# Patient Record
Sex: Female | Born: 1965
Health system: Southern US, Community
[De-identification: ages and names within clinical notes are randomized; demographics above are authoritative.]

## PROBLEM LIST (undated history)

## (undated) DIAGNOSIS — I701 Atherosclerosis of renal artery: Secondary | ICD-10-CM

## (undated) DIAGNOSIS — I150 Renovascular hypertension: Secondary | ICD-10-CM

## (undated) DIAGNOSIS — I10 Essential (primary) hypertension: Secondary | ICD-10-CM

## (undated) DIAGNOSIS — E785 Hyperlipidemia, unspecified: Secondary | ICD-10-CM

## (undated) DIAGNOSIS — K5900 Constipation, unspecified: Secondary | ICD-10-CM

## (undated) DIAGNOSIS — D649 Anemia, unspecified: Secondary | ICD-10-CM

## (undated) DIAGNOSIS — I700 Atherosclerosis of aorta: Secondary | ICD-10-CM

## (undated) DIAGNOSIS — T7840XA Allergy, unspecified, initial encounter: Secondary | ICD-10-CM

## (undated) DIAGNOSIS — E119 Type 2 diabetes mellitus without complications: Secondary | ICD-10-CM

## (undated) DIAGNOSIS — K219 Gastro-esophageal reflux disease without esophagitis: Secondary | ICD-10-CM

## (undated) HISTORY — DX: Gastro-esophageal reflux disease without esophagitis: K21.9

## (undated) HISTORY — DX: Allergy, unspecified, initial encounter: T78.40XA

## (undated) HISTORY — DX: Anemia, unspecified: D64.9

## (undated) HISTORY — DX: Hyperlipidemia, unspecified: E78.5

## (undated) HISTORY — DX: Constipation, unspecified: K59.00

## (undated) HISTORY — DX: Renovascular hypertension: I15.0

## (undated) HISTORY — PX: TUBAL LIGATION: SHX77

## (undated) HISTORY — PX: OTHER SURGICAL HISTORY: SHX169

## (undated) HISTORY — DX: Atherosclerosis of aorta: I70.0

## (undated) HISTORY — DX: Atherosclerosis of renal artery: I70.1

## (undated) HISTORY — PX: ABLATION: SHX5711

## (undated) HISTORY — PX: WISDOM TOOTH EXTRACTION: SHX21

---

## 2016-04-24 DIAGNOSIS — Z112 Encounter for screening for other bacterial diseases: Secondary | ICD-10-CM | POA: Diagnosis not present

## 2016-04-24 DIAGNOSIS — R509 Fever, unspecified: Secondary | ICD-10-CM | POA: Diagnosis not present

## 2016-04-24 DIAGNOSIS — J3489 Other specified disorders of nose and nasal sinuses: Secondary | ICD-10-CM | POA: Diagnosis not present

## 2016-04-24 DIAGNOSIS — H6692 Otitis media, unspecified, left ear: Secondary | ICD-10-CM | POA: Diagnosis not present

## 2016-05-01 DIAGNOSIS — Z01419 Encounter for gynecological examination (general) (routine) without abnormal findings: Secondary | ICD-10-CM | POA: Diagnosis not present

## 2016-05-01 DIAGNOSIS — K219 Gastro-esophageal reflux disease without esophagitis: Secondary | ICD-10-CM | POA: Diagnosis not present

## 2016-05-01 DIAGNOSIS — R109 Unspecified abdominal pain: Secondary | ICD-10-CM | POA: Diagnosis not present

## 2016-05-05 DIAGNOSIS — Z6841 Body Mass Index (BMI) 40.0 and over, adult: Secondary | ICD-10-CM | POA: Diagnosis not present

## 2016-05-05 DIAGNOSIS — Z0000 Encounter for general adult medical examination without abnormal findings: Secondary | ICD-10-CM | POA: Diagnosis not present

## 2016-05-05 DIAGNOSIS — Z1389 Encounter for screening for other disorder: Secondary | ICD-10-CM | POA: Diagnosis not present

## 2016-05-08 DIAGNOSIS — Z803 Family history of malignant neoplasm of breast: Secondary | ICD-10-CM | POA: Diagnosis not present

## 2016-05-08 DIAGNOSIS — Z1231 Encounter for screening mammogram for malignant neoplasm of breast: Secondary | ICD-10-CM | POA: Diagnosis not present

## 2016-05-21 ENCOUNTER — Encounter: Payer: Self-pay | Admitting: Gastroenterology

## 2016-05-27 ENCOUNTER — Encounter (HOSPITAL_COMMUNITY): Payer: Self-pay | Admitting: Emergency Medicine

## 2016-05-27 ENCOUNTER — Emergency Department (HOSPITAL_COMMUNITY): Payer: BLUE CROSS/BLUE SHIELD

## 2016-05-27 ENCOUNTER — Emergency Department (HOSPITAL_COMMUNITY)
Admission: EM | Admit: 2016-05-27 | Discharge: 2016-05-27 | Disposition: A | Discharge status: Home / Self Care | Payer: BLUE CROSS/BLUE SHIELD | Attending: Emergency Medicine | Admitting: Emergency Medicine

## 2016-05-27 DIAGNOSIS — R109 Unspecified abdominal pain: Secondary | ICD-10-CM

## 2016-05-27 DIAGNOSIS — Z87891 Personal history of nicotine dependence: Secondary | ICD-10-CM | POA: Diagnosis not present

## 2016-05-27 DIAGNOSIS — R1011 Right upper quadrant pain: Secondary | ICD-10-CM | POA: Diagnosis not present

## 2016-05-27 DIAGNOSIS — K760 Fatty (change of) liver, not elsewhere classified: Secondary | ICD-10-CM | POA: Diagnosis not present

## 2016-05-27 DIAGNOSIS — Z79899 Other long term (current) drug therapy: Secondary | ICD-10-CM | POA: Insufficient documentation

## 2016-05-27 HISTORY — DX: Essential (primary) hypertension: I10

## 2016-05-27 LAB — CBC
HCT: 37.4 % (ref 36.0–46.0)
Hemoglobin: 12.6 g/dL (ref 12.0–15.0)
MCH: 28.8 pg (ref 26.0–34.0)
MCHC: 33.7 g/dL (ref 30.0–36.0)
MCV: 85.6 fL (ref 78.0–100.0)
Platelets: 294 10*3/uL (ref 150–400)
RBC: 4.37 MIL/uL (ref 3.87–5.11)
RDW: 13.4 % (ref 11.5–15.5)
WBC: 9.8 10*3/uL (ref 4.0–10.5)

## 2016-05-27 LAB — COMPREHENSIVE METABOLIC PANEL
ALT: 23 U/L (ref 14–54)
AST: 16 U/L (ref 15–41)
Albumin: 3.8 g/dL (ref 3.5–5.0)
Alkaline Phosphatase: 59 U/L (ref 38–126)
Anion gap: 8 (ref 5–15)
BUN: 22 mg/dL — ABNORMAL HIGH (ref 6–20)
CO2: 26 mmol/L (ref 22–32)
Calcium: 8.8 mg/dL — ABNORMAL LOW (ref 8.9–10.3)
Chloride: 106 mmol/L (ref 101–111)
Creatinine, Ser: 0.98 mg/dL (ref 0.44–1.00)
GFR calc Af Amer: >60 mL/min (ref >60)
GFR calc non Af Amer: >60 mL/min (ref >60)
Glucose, Bld: 116 mg/dL — ABNORMAL HIGH (ref 65–99)
Potassium: 3.6 mmol/L (ref 3.5–5.1)
Sodium: 140 mmol/L (ref 135–145)
Total Bilirubin: 0.7 mg/dL (ref 0.3–1.2)
Total Protein: 7.6 g/dL (ref 6.5–8.1)

## 2016-05-27 LAB — URINALYSIS, ROUTINE W REFLEX MICROSCOPIC
Bilirubin Urine: NEGATIVE
Glucose, UA: NEGATIVE mg/dL
Hgb urine dipstick: NEGATIVE
Ketones, ur: NEGATIVE mg/dL
Leukocytes, UA: NEGATIVE
Nitrite: NEGATIVE
Protein, ur: NEGATIVE mg/dL
Specific Gravity, Urine: 1.01 (ref 1.005–1.030)
pH: 6.5 (ref 5.0–8.0)

## 2016-05-27 LAB — LIPASE, BLOOD: Lipase: 35 U/L (ref 11–51)

## 2016-05-27 LAB — I-STAT BETA HCG BLOOD, ED (MC, WL, AP ONLY): I-stat hCG, quantitative: <5 m[IU]/mL (ref <5)

## 2016-05-27 MED ORDER — ONDANSETRON 4 MG PO TBDP
4.0000 mg | ORAL_TABLET | Freq: Once | ORAL | Status: AC | PRN
  Administered 2016-05-27: 4 mg via ORAL
  Filled 2016-05-27: qty 1

## 2016-05-27 MED ORDER — ONDANSETRON HCL 4 MG/2ML IJ SOLN
4.0000 mg | Freq: Four times a day (QID) | INTRAMUSCULAR | Status: DC | PRN
  Administered 2016-05-27: 4 mg via INTRAVENOUS
  Filled 2016-05-27: qty 2

## 2016-05-27 MED ORDER — MORPHINE SULFATE (PF) 4 MG/ML IV SOLN
4.0000 mg | INTRAVENOUS | Status: DC | PRN
  Administered 2016-05-27: 4 mg via INTRAVENOUS
  Filled 2016-05-27: qty 1

## 2016-05-27 NOTE — ED Provider Notes (Signed)
CSN: [id number]     Arrival date & time 05/27/16  1554 History   First MD Initiated Contact with Patient 05/27/16 1800     Chief Complaint  Patient presents with  . Abdominal Pain     (Consider location/radiation/quality/duration/timing/severity/associated sxs/prior Treatment) HPI Comments: Pt comes in with cc of abd pain.  Pt has hx of HTN, no abd suegery hx, no hx of renal stones or pelvic disorder. She reports that she has been having some abd pain intermittently for the past week, but this afternoon it got severe. The pain is located in the RUQ and Right flank region. She is unsure if the pain is related to po intake. There is associated nausea and loose BM. No uti like symptoms.   ROS 10 Systems reviewed and are negative for acute change except as noted in the HPI.     Patient is a 50 y.o. female presenting with abdominal pain. The history is provided by the patient.  Abdominal Pain   Past Medical History  Diagnosis Date  . Hypertension    No past surgical history on file. No family history on file. Social History  Substance Use Topics  . Smoking status: Former Research scientist (life sciences)  . Smokeless tobacco: Not on file  . Alcohol Use: No   OB History    No data available     Review of Systems  Gastrointestinal: Positive for abdominal pain.      Allergies  Review of patient's allergies indicates no known allergies.  Home Medications   Prior to Admission medications   Medication Sig Start Date End Date Taking? Authorizing Provider  amLODipine (NORVASC) 10 MG tablet Take 10 mg by mouth daily.   Yes Historical Provider, MD  benazepril (LOTENSIN) 20 MG tablet Take 20 mg by mouth daily.   Yes Historical Provider, MD  cloNIDine (CATAPRES) 0.1 MG tablet Take 0.1 mg by mouth 3 (three) times daily as needed. High blood pressure 05/05/16  Yes Historical Provider, MD  Cyanocobalamin (VITAMIN B-12 PO) Take 1 tablet by mouth daily.   Yes Historical Provider, MD  furosemide (LASIX) 40 MG  tablet Take 40 mg by mouth daily as needed. swelling 05/05/16  Yes Historical Provider, MD  omeprazole (PRILOSEC) 20 MG capsule Take 20 mg by mouth daily.   Yes Historical Provider, MD  OVER THE COUNTER MEDICATION Take 1 tablet by mouth daily. OTC- Mega red supplement tablet   Yes Historical Provider, MD  triamterene-hydrochlorothiazide (DYAZIDE) 37.5-25 MG capsule Take 1 capsule by mouth daily.   Yes Historical Provider, MD   BP 104/54 mmHg  Pulse 65  Temp(Src) 98 F (36.7 C) (Oral)  Resp 18  SpO2 96% Physical Exam  Constitutional: She is oriented to person, place, and time. She appears well-developed.  HENT:  Head: Normocephalic and atraumatic.  Eyes: Conjunctivae and EOM are normal. Pupils are equal, round, and reactive to light.  Neck: Normal range of motion. Neck supple.  Cardiovascular: Normal rate, regular rhythm and normal heart sounds.   Pulmonary/Chest: Effort normal and breath sounds normal. No respiratory distress.  Abdominal: Soft. Bowel sounds are normal. She exhibits no distension. There is tenderness. There is no rebound and no guarding.  RUQ tenderness  Neurological: She is alert and oriented to person, place, and time.  Skin: Skin is warm and dry.  Nursing note and vitals reviewed.   ED Course  Procedures (including critical care time) Labs Review Labs Reviewed  COMPREHENSIVE METABOLIC PANEL - Abnormal; Notable for the following:  Glucose, Bld 116 (*)    BUN 22 (*)    Calcium 8.8 (*)    All other components within normal limits  LIPASE, BLOOD  CBC  URINALYSIS, ROUTINE W REFLEX MICROSCOPIC (NOT AT [facility])  I-STAT BETA HCG BLOOD, ED ([facility], [facility], [facility] ONLY)    Imaging Review US Abdomen Limited  05/27/2016  CLINICAL DATA:  Right upper quadrant abdomen pain for 1 week EXAM: US ABDOMEN LIMITED - RIGHT UPPER QUADRANT COMPARISON:  None. FINDINGS: Gallbladder: No gallstones or wall thickening visualized. No sonographic Murphy sign noted by sonographer. Common bile duct:  Diameter: 2.3 mm Liver: No focal lesion identified. There is diffuse increased echotexture of the liver. IMPRESSION: Normal gallbladder. Fatty infiltration of liver. Electronically Signed   By: Abelardo Diesel M.D.   On: 05/27/2016 20:26   I have personally reviewed and evaluated these images and lab results as part of my medical decision-making.   EKG Interpretation None      MDM   Final diagnoses:  RUQ abdominal pain  Acute flank pain    DDx includes: Pancreatitis Hepatobiliary pathology including cholecystitis Gastritis/PUD SBO Pyelonephritis Renal stones  Pt comes in with 1 week of RUQ and R flank pain w/o uti like symptoms or specific association to food intake. She has abd tenderness in the RUQ w/o guarding currently, and the pain is improving. We will get [place] abd and basic labs.  9:20 PM Pt's labs are reassuring and so is the [place] ABD. PT reports pain is at 2/10. Will d/c.  Pt to see pcp for further evaluation, as other non emergent conditions possible. Strict ER return precautions have been discussed, and patient is agreeing with the plan and is comfortable with the workup done and the recommendations from the ER.   Varney Biles, MD 05/27/16 2120

## 2016-05-27 NOTE — ED Notes (Signed)
Pt c/o right abdominal pain, nausea, diarrhea onset 1 week ago, worse after eating. Hx of gallbladder issues. No emesis.

## 2016-05-27 NOTE — Discharge Instructions (Signed)
We saw you in the ER for THE ABDOMINAL PAIN AND BACK PAIN. All the results in the ER are normal, labs and imaging. We are not sure what is causing your symptoms. The workup in the ER is not complete, and is limited to screening for life threatening and emergent conditions only, so please see a primary care doctor for further evaluation.   Abdominal Pain, Adult Many things can cause abdominal pain. Usually, abdominal pain is not caused by a disease and will improve without treatment. It can often be observed and treated at home. Your health care provider will do a physical exam and possibly order blood tests and X-rays to help determine the seriousness of your pain. However, in many cases, more time must pass before a clear cause of the pain can be found. Before that point, your health care provider [place] not know if you need more testing or further treatment. HOME CARE INSTRUCTIONS Monitor your abdominal pain for any changes. The following actions Smoots help to alleviate any discomfort you are experiencing:  Only take over-the-counter or prescription medicines as directed by your health care provider.  Do not take laxatives unless directed to do so by your health care provider.  Try a clear liquid diet (broth, tea, or water) as directed by your health care provider. Slowly move to a bland diet as tolerated. SEEK MEDICAL CARE IF:  You have unexplained abdominal pain.  You have abdominal pain associated with nausea or diarrhea.  You have pain when you urinate or have a bowel movement.  You experience abdominal pain that wakes you in the night.  You have abdominal pain that is worsened or improved by eating food.  You have abdominal pain that is worsened with eating fatty foods.  You have a fever. SEEK IMMEDIATE MEDICAL CARE IF:  Your pain does not go away within 2 hours.  You keep throwing up (vomiting).  Your pain is felt only in portions of the abdomen, such as the right side or the  left lower portion of the abdomen.  You pass bloody or black tarry stools. MAKE SURE YOU:  Understand these instructions.  Will watch your condition.  Will get help right away if you are not doing well or get worse.   This information is not intended to replace advice given to you by your health care provider. Make sure you discuss any questions you have with your health care provider.   Document Released: 08/26/2005 Document Revised: 08/07/2015 Document Reviewed: 07/26/2013 Elsevier Interactive Patient Education [employer].

## 2016-05-29 ENCOUNTER — Emergency Department (HOSPITAL_COMMUNITY): Payer: BLUE CROSS/BLUE SHIELD

## 2016-05-29 ENCOUNTER — Observation Stay (HOSPITAL_COMMUNITY)
Admission: EM | Admit: 2016-05-29 | Discharge: 2016-05-30 | Disposition: A | Discharge status: Home / Self Care | Payer: BLUE CROSS/BLUE SHIELD | Attending: Allergy & Immunology | Admitting: Allergy & Immunology

## 2016-05-29 ENCOUNTER — Encounter (HOSPITAL_COMMUNITY): Payer: Self-pay

## 2016-05-29 DIAGNOSIS — D729 Disorder of white blood cells, unspecified: Secondary | ICD-10-CM | POA: Diagnosis not present

## 2016-05-29 DIAGNOSIS — I10 Essential (primary) hypertension: Secondary | ICD-10-CM | POA: Insufficient documentation

## 2016-05-29 DIAGNOSIS — R197 Diarrhea, unspecified: Secondary | ICD-10-CM | POA: Diagnosis not present

## 2016-05-29 DIAGNOSIS — R1013 Epigastric pain: Secondary | ICD-10-CM | POA: Diagnosis present

## 2016-05-29 DIAGNOSIS — I152 Hypertension secondary to endocrine disorders: Secondary | ICD-10-CM

## 2016-05-29 DIAGNOSIS — Z87891 Personal history of nicotine dependence: Secondary | ICD-10-CM | POA: Insufficient documentation

## 2016-05-29 DIAGNOSIS — R112 Nausea with vomiting, unspecified: Secondary | ICD-10-CM | POA: Diagnosis not present

## 2016-05-29 DIAGNOSIS — E860 Dehydration: Secondary | ICD-10-CM | POA: Insufficient documentation

## 2016-05-29 DIAGNOSIS — R1110 Vomiting, unspecified: Secondary | ICD-10-CM | POA: Diagnosis not present

## 2016-05-29 DIAGNOSIS — R109 Unspecified abdominal pain: Secondary | ICD-10-CM | POA: Diagnosis not present

## 2016-05-29 DIAGNOSIS — E1159 Type 2 diabetes mellitus with other circulatory complications: Secondary | ICD-10-CM

## 2016-05-29 DIAGNOSIS — D72829 Elevated white blood cell count, unspecified: Secondary | ICD-10-CM

## 2016-05-29 DIAGNOSIS — Z79899 Other long term (current) drug therapy: Secondary | ICD-10-CM | POA: Insufficient documentation

## 2016-05-29 LAB — CBC WITH DIFFERENTIAL/PLATELET
Basophils Absolute: 0 10*3/uL (ref 0.0–0.1)
Basophils Relative: 0 %
Eosinophils Absolute: 0.1 10*3/uL (ref 0.0–0.7)
Eosinophils Relative: 1 %
HCT: 39 % (ref 36.0–46.0)
Hemoglobin: 13.2 g/dL (ref 12.0–15.0)
Lymphocytes Relative: 9 %
Lymphs Abs: 1.7 10*3/uL (ref 0.7–4.0)
MCH: 29.1 pg (ref 26.0–34.0)
MCHC: 33.8 g/dL (ref 30.0–36.0)
MCV: 86.1 fL (ref 78.0–100.0)
Monocytes Absolute: 1.5 10*3/uL — ABNORMAL HIGH (ref 0.1–1.0)
Monocytes Relative: 8 %
Neutro Abs: 16.3 10*3/uL — ABNORMAL HIGH (ref 1.7–7.7)
Neutrophils Relative %: 82 %
Platelets: 297 10*3/uL (ref 150–400)
RBC: 4.53 MIL/uL (ref 3.87–5.11)
RDW: 13.4 % (ref 11.5–15.5)
WBC: 19.6 10*3/uL — ABNORMAL HIGH (ref 4.0–10.5)

## 2016-05-29 LAB — COMPREHENSIVE METABOLIC PANEL
ALT: 21 U/L (ref 14–54)
AST: 18 U/L (ref 15–41)
Albumin: 4 g/dL (ref 3.5–5.0)
Alkaline Phosphatase: 64 U/L (ref 38–126)
Anion gap: 10 (ref 5–15)
BUN: 23 mg/dL — ABNORMAL HIGH (ref 6–20)
CO2: 22 mmol/L (ref 22–32)
Calcium: 8.9 mg/dL (ref 8.9–10.3)
Chloride: 105 mmol/L (ref 101–111)
Creatinine, Ser: 0.89 mg/dL (ref 0.44–1.00)
GFR calc Af Amer: >60 mL/min (ref >60)
GFR calc non Af Amer: >60 mL/min (ref >60)
Glucose, Bld: 149 mg/dL — ABNORMAL HIGH (ref 65–99)
Potassium: 3.6 mmol/L (ref 3.5–5.1)
Sodium: 137 mmol/L (ref 135–145)
Total Bilirubin: 0.6 mg/dL (ref 0.3–1.2)
Total Protein: 7.9 g/dL (ref 6.5–8.1)

## 2016-05-29 LAB — LIPASE, BLOOD: Lipase: 33 U/L (ref 11–51)

## 2016-05-29 MED ORDER — FENTANYL CITRATE (PF) 100 MCG/2ML IJ SOLN: 50.0000 ug | INTRAMUSCULAR | Status: DC | PRN

## 2016-05-29 MED ORDER — ONDANSETRON HCL 4 MG/2ML IJ SOLN
4.0000 mg | Freq: Three times a day (TID) | INTRAMUSCULAR | Status: DC | PRN
  Administered 2016-05-29: 4 mg via INTRAVENOUS
  Filled 2016-05-29: qty 2

## 2016-05-29 MED ORDER — POTASSIUM CHLORIDE IN NACL 20-0.9 MEQ/L-% IV SOLN
INTRAVENOUS | Status: DC
  Administered 2016-05-29: 13:00:00 via INTRAVENOUS
  Filled 2016-05-29 (×2): qty 1000

## 2016-05-29 MED ORDER — AMLODIPINE BESYLATE 10 MG PO TABS
10.0000 mg | ORAL_TABLET | Freq: Every day | ORAL | Status: DC
  Administered 2016-05-29 – 2016-05-30 (×2): 10 mg via ORAL
  Filled 2016-05-29 (×3): qty 1

## 2016-05-29 MED ORDER — SODIUM CHLORIDE 0.9 % IV BOLUS (SEPSIS)
1000.0000 mL | Freq: Once | INTRAVENOUS | Status: AC
  Administered 2016-05-29: 1000 mL via INTRAVENOUS

## 2016-05-29 MED ORDER — ONDANSETRON HCL 4 MG PO TABS: 4.0000 mg | ORAL_TABLET | Freq: Four times a day (QID) | ORAL | Status: DC | PRN

## 2016-05-29 MED ORDER — ONDANSETRON HCL 4 MG/2ML IJ SOLN
4.0000 mg | Freq: Once | INTRAMUSCULAR | Status: AC
  Administered 2016-05-29: 4 mg via INTRAVENOUS
  Filled 2016-05-29: qty 2

## 2016-05-29 MED ORDER — TRIAMTERENE-HCTZ 37.5-25 MG PO TABS
1.0000 | ORAL_TABLET | Freq: Every day | ORAL | Status: DC
  Administered 2016-05-29 – 2016-05-30 (×2): 1 via ORAL
  Filled 2016-05-29 (×4): qty 1

## 2016-05-29 MED ORDER — ZOLPIDEM TARTRATE 5 MG PO TABS: 5.0000 mg | ORAL_TABLET | Freq: Every evening | ORAL | Status: DC | PRN

## 2016-05-29 MED ORDER — SODIUM CHLORIDE 0.9 % IV SOLN
INTRAVENOUS | Status: AC
  Administered 2016-05-29: 150 mL/h via INTRAVENOUS

## 2016-05-29 MED ORDER — TRIAMTERENE-HCTZ 37.5-25 MG PO CAPS
1.0000 | ORAL_CAPSULE | Freq: Every day | ORAL | Status: DC
  Filled 2016-05-29: qty 1

## 2016-05-29 MED ORDER — BENAZEPRIL HCL 10 MG PO TABS
20.0000 mg | ORAL_TABLET | Freq: Every day | ORAL | Status: DC
Start: 2016-05-29 — End: 2016-05-30
  Administered 2016-05-29 – 2016-05-30 (×2): 20 mg via ORAL
  Filled 2016-05-29: qty 2
  Filled 2016-05-29: qty 1
  Filled 2016-05-29: qty 2

## 2016-05-29 MED ORDER — PANTOPRAZOLE SODIUM 40 MG PO TBEC
40.0000 mg | DELAYED_RELEASE_TABLET | Freq: Every day | ORAL | Status: DC
  Administered 2016-05-29 – 2016-05-30 (×2): 40 mg via ORAL
  Filled 2016-05-29 (×2): qty 1

## 2016-05-29 MED ORDER — IOPAMIDOL (ISOVUE-300) INJECTION 61%
100.0000 mL | Freq: Once | INTRAVENOUS | Status: AC | PRN
  Administered 2016-05-29: 100 mL via INTRAVENOUS

## 2016-05-29 MED ORDER — METRONIDAZOLE IN NACL 5-0.79 MG/ML-% IV SOLN
500.0000 mg | Freq: Three times a day (TID) | INTRAVENOUS | Status: DC
  Administered 2016-05-29 – 2016-05-30 (×4): 500 mg via INTRAVENOUS
  Filled 2016-05-29 (×4): qty 100

## 2016-05-29 MED ORDER — ONDANSETRON HCL 4 MG/2ML IJ SOLN
4.0000 mg | Freq: Four times a day (QID) | INTRAMUSCULAR | Status: DC | PRN
  Administered 2016-05-29: 4 mg via INTRAVENOUS
  Filled 2016-05-29: qty 2

## 2016-05-29 MED ORDER — CLONIDINE HCL 0.1 MG PO TABS: 0.1000 mg | ORAL_TABLET | Freq: Three times a day (TID) | ORAL | Status: DC | PRN

## 2016-05-29 NOTE — ED Provider Notes (Signed)
CSN: [id number]     Arrival date & time 05/29/16  0740 History   First MD Initiated Contact with Patient 05/29/16 0750     Chief Complaint  Patient presents with  . Abdominal Pain  . Emesis  . Diarrhea     HPI Patient c/o upper abdominal pain x 1 1/2 weeks ago. Patient states she has vomited x 10 and diarrheal stool x 6 in the past 24  Past Medical History  Diagnosis Date  . Hypertension    Past Surgical History  Procedure Laterality Date  . Tubal ligation    . Ablation    . Uterine fibroid removal     Family History  Problem Relation Age of Onset  . Cancer Mother   . Atrial fibrillation Mother   . Pulmonary embolism Mother   . Heart failure Sister    Social History  Substance Use Topics  . Smoking status: Former Research scientist (life sciences)  . Smokeless tobacco: None  . Alcohol Use: No   OB History    No data available     Review of Systems  All other systems reviewed and are negative.     Allergies  Review of patient's allergies indicates no known allergies.  Home Medications   Prior to Admission medications   Medication Sig Start Date End Date Taking? Authorizing Provider  amLODipine (NORVASC) 10 MG tablet Take 10 mg by mouth daily.   Yes Historical Provider, MD  benazepril (LOTENSIN) 20 MG tablet Take 20 mg by mouth daily.   Yes Historical Provider, MD  cloNIDine (CATAPRES) 0.1 MG tablet Take 0.1 mg by mouth 3 (three) times daily as needed. High blood pressure 05/05/16  Yes Historical Provider, MD  Cyanocobalamin (VITAMIN B-12 PO) Take 1 tablet by mouth daily.   Yes Historical Provider, MD  furosemide (LASIX) 40 MG tablet Take 40 mg by mouth daily as needed for fluid or edema.  05/05/16  Yes Historical Provider, MD  omeprazole (PRILOSEC) 20 MG capsule Take 20 mg by mouth daily.   Yes Historical Provider, MD  OVER THE COUNTER MEDICATION Take 1 tablet by mouth daily. OTC- Mega red supplement tablet   Yes Historical Provider, MD  triamterene-hydrochlorothiazide (DYAZIDE) 37.5-25 MG  capsule Take 1 capsule by mouth daily.   Yes Historical Provider, MD   BP 122/79 mmHg  Pulse 90  Temp(Src) 97.5 F (36.4 C) (Oral)  Resp 18  Ht 5\' 3"  (1.6 m)  Wt 230 lb (104.327 kg)  BMI 40.75 kg/m2  SpO2 97%  LMP 03/29/2016 Physical Exam  Constitutional: She is oriented to person, place, and time. She appears well-developed and well-nourished. No distress.  HENT:  Head: Normocephalic and atraumatic.  Eyes: Pupils are equal, round, and reactive to light.  Neck: Normal range of motion.  Cardiovascular: Normal rate and intact distal pulses.   Pulmonary/Chest: No respiratory distress.  Abdominal: Normal appearance. She exhibits no distension. There is tenderness in the epigastric area and left upper quadrant. There is no rigidity, no rebound and no guarding.  Musculoskeletal: Normal range of motion.  Neurological: She is alert and oriented to person, place, and time. No cranial nerve deficit.  Skin: Skin is warm and dry. No rash noted.  Psychiatric: She has a normal mood and affect. Her behavior is normal.  Nursing note and vitals reviewed.   ED Course  Procedures (including critical care time) Medications  0.9 %  sodium chloride infusion (not administered)  ondansetron (ZOFRAN) injection 4 mg (not administered)  fentaNYL (SUBLIMAZE) injection 50  mcg (not administered)  ondansetron (ZOFRAN) injection 4 mg (4 mg Intravenous Given 05/29/16 0822)  sodium chloride 0.9 % bolus 1,000 mL (0 mLs Intravenous Stopped 05/29/16 1032)  iopamidol (ISOVUE-300) 61 % injection 100 mL (100 mLs Intravenous Contrast Given 05/29/16 0904)    Labs Review Labs Reviewed  CBC WITH DIFFERENTIAL/PLATELET - Abnormal; Notable for the following:    WBC 19.6 (*)    Neutro Abs 16.3 (*)    Monocytes Absolute 1.5 (*)    All other components within normal limits  COMPREHENSIVE METABOLIC PANEL - Abnormal; Notable for the following:    Glucose, Bld 149 (*)    BUN 23 (*)    All other components within normal  limits  LIPASE, BLOOD    Imaging Review Ct Abdomen Pelvis W Contrast  05/29/2016  CLINICAL DATA:  Ten day history abdominal pain with vomiting and diarrhea. Hypertension. EXAM: CT ABDOMEN AND PELVIS WITH CONTRAST TECHNIQUE: Multidetector CT imaging of the abdomen and pelvis was performed using the standard protocol following bolus administration of intravenous contrast. CONTRAST:  135mL ISOVUE-300 IOPAMIDOL (ISOVUE-300) INJECTION 61% COMPARISON:  None. FINDINGS: Lower chest: There is slight right lower lobe atelectatic change. No lung base edema or consolidation. There is a small calcified granuloma in the posterior segment of the right lower lobe. Hepatobiliary: No focal liver lesions are evident. Gallbladder wall is not appreciably thickened. There is no biliary duct dilatation. Pancreas: There is no pancreatic mass or inflammatory focus. Spleen: No splenic lesions are evident. Adrenals/Urinary Tract: Adrenals appear normal bilaterally. The right kidney shows areas of scarring and renal cortical thinning. Right kidney is significantly smaller than left kidney. There is a 9 x 7 mm cyst arising from the upper pole of the right kidney. There is no hydronephrosis on either side. There is no appreciable renal or ureteral calculus on either side. Urinary bladder is midline with wall thickness within normal limits. Stomach/Bowel: Most bowel loops are fluid filled. There is no bowel wall or mesenteric thickening. There is no appreciable bowel obstruction. No free air or portal venous air. Vascular/Lymphatic: There are scattered foci of calcification in the aorta and left common iliac artery. A small amount of peripheral thrombus is noted in the distal aorta. There is no abdominal aortic aneurysm. Major mesenteric vessels appear patent. The right renal artery appears diminutive. Reproductive: Uterus is anteverted. There is no pelvic mass or pelvic fluid collection. Other: Appendix appears normal. There is no abscess  or ascites in the abdomen or pelvis. There is a small ventral hernia containing only fat. Musculoskeletal: There are no blastic or lytic bone lesions. There is questionable old trauma involving the medial left ischium with degenerative change in the pubic symphysis region. There is no intramuscular or abdominal wall lesion. IMPRESSION: Most bowel loops are fluid filled. While this finding Hole be seen normally, it Binning be indicative of early ileus or enteritis. There is no appreciable bowel wall thickening. No bowel obstruction. No abscess.  Appendix appears normal. Right kidney appears atrophic compared to the left kidney. Question a degree of renal artery stenosis on the right. No renal or ureteral calculus. No hydronephrosis. Aortic atherosclerosis. Small ventral hernia containing only fat. Electronically Signed   By: Lowella Grip III M.D.   On: 05/29/2016 09:25   US Abdomen Limited  05/27/2016  CLINICAL DATA:  Right upper quadrant abdomen pain for 1 week EXAM: US ABDOMEN LIMITED - RIGHT UPPER QUADRANT COMPARISON:  None. FINDINGS: Gallbladder: No gallstones or wall thickening visualized. No sonographic  Murphy sign noted by sonographer. Common bile duct: Diameter: 2.3 mm Liver: No focal lesion identified. There is diffuse increased echotexture of the liver. IMPRESSION: Normal gallbladder. Fatty infiltration of liver. Electronically Signed   By: Abelardo Diesel M.D.   On: 05/27/2016 20:26   I have personally reviewed and evaluated these images and lab results as part of my medical decision-making.    MDM   Final diagnoses:  Vomiting and diarrhea  Dehydration  Leukocytosis        Leonard Schwartz, MD 05/29/16 1043

## 2016-05-29 NOTE — ED Notes (Signed)
Patient c/o upper abdominal pain x 1 1/2 weeks ago. Patient states she has vomited x 10 and diarrheal stool x 6 in the past 24 hours.

## 2016-05-29 NOTE — H&P (Signed)
History and Physical  Michelle Ray [id number] DOB: Michelle Ray 01, 1967 DOA: 05/29/2016   PCP: No primary care provider on file.   Patient coming from: Home   Chief Complaint: Abd pain, diarrhea, vomiting   HPI: Michelle Ray is a 50 y.o. female with medical history significant for HTN being admitted for gastroenteritis. She is a night RN here and was seen in the ED 2 days ago for similar pain. She had negative workup and was sent home. She did well until early this AM while working, with sudden onset around 4am of epigastric nonradiating abdominal pain followed by vomiting x10 and diarrhea which is loose and mostly watery x6 so far this AM. Denies fevers, chills, chest pain, back pain, blood or dark material in vomitus or stools.   ED Course: Found to have new leukocytosis and fluid filled small bowel on CT scan.  Review of Systems: Please see HPI for pertinent positives and negatives. A complete 10 system review of systems are otherwise negative.  Past Medical History  Diagnosis Date  . Hypertension    Past Surgical History  Procedure Laterality Date  . Tubal ligation    . Ablation    . Uterine fibroid removal      Social History:  reports that she has quit smoking. She does not have any smokeless tobacco history on file. She reports that she does not drink alcohol or use illicit drugs.   No Known Allergies  Family History  Problem Relation Age of Onset  . Cancer Mother   . Atrial fibrillation Mother   . Pulmonary embolism Mother   . Heart failure Sister      Prior to Admission medications   Medication Sig Start Date End Date Taking? Authorizing Provider  amLODipine (NORVASC) 10 MG tablet Take 10 mg by mouth daily.   Yes Historical Provider, MD  benazepril (LOTENSIN) 20 MG tablet Take 20 mg by mouth daily.   Yes Historical Provider, MD  cloNIDine (CATAPRES) 0.1 MG tablet Take 0.1 mg by mouth 3 (three) times daily as needed. High blood pressure 05/05/16  Yes Historical Provider, MD   Cyanocobalamin (VITAMIN B-12 PO) Take 1 tablet by mouth daily.   Yes Historical Provider, MD  furosemide (LASIX) 40 MG tablet Take 40 mg by mouth daily as needed for fluid or edema.  05/05/16  Yes Historical Provider, MD  omeprazole (PRILOSEC) 20 MG capsule Take 20 mg by mouth daily.   Yes Historical Provider, MD  OVER THE COUNTER MEDICATION Take 1 tablet by mouth daily. OTC- Mega red supplement tablet   Yes Historical Provider, MD  triamterene-hydrochlorothiazide (DYAZIDE) 37.5-25 MG capsule Take 1 capsule by mouth daily.   Yes Historical Provider, MD    Physical Exam: BP 122/79 mmHg  Pulse 90  Temp(Src) 97.5 F (36.4 C) (Oral)  Resp 18  Ht 5\' 3"  (1.6 m)  Wt 104.327 kg (230 lb)  BMI 40.75 kg/m2  SpO2 97%  LMP 03/29/2016  General:  Alert, oriented, calm, in no acute distress Eyes: pupils round and reactive to light and accomodation, clear sclerea Neck: supple, no masses, trachea mildline  Cardiovascular: RRR, no murmurs or rubs, trace LE peripheral edema  Respiratory: clear to auscultation bilaterally, no wheezes, no crackles  Abdomen: soft, obese, tender in upper quadrants, no rebound tenderness, nondistended, normal bowel tones heard  Skin: dry, no rashes  Musculoskeletal: no joint effusions, normal range of motion  Psychiatric: appropriate affect, normal speech  Neurologic: extraocular muscles intact, clear speech, moving all extremities with intact  sensorium            Labs on Admission:  Basic Metabolic Panel:  Recent Labs Lab 05/27/16 1643 05/29/16 0826  NA 140 137  K 3.6 3.6  CL 106 105  CO2 26 22  GLUCOSE 116* 149*  BUN 22* 23*  CREATININE 0.98 0.89  CALCIUM 8.8* 8.9   Liver Function Tests:  Recent Labs Lab 05/27/16 1643 05/29/16 0826  AST 16 18  ALT 23 21  ALKPHOS 59 64  BILITOT 0.7 0.6  PROT 7.6 7.9  ALBUMIN 3.8 4.0    Recent Labs Lab 05/27/16 1643 05/29/16 0826  LIPASE 35 33   No results for input(s): AMMONIA in the last 168  hours. CBC:  Recent Labs Lab 05/27/16 1643 05/29/16 0826  WBC 9.8 19.6*  NEUTROABS  --  16.3*  HGB 12.6 13.2  HCT 37.4 39.0  MCV 85.6 86.1  PLT 294 297   Cardiac Enzymes: No results for input(s): CKTOTAL, CKMB, CKMBINDEX, TROPONINI in the last 168 hours.  BNP (last 3 results) No results for input(s): BNP in the last 8760 hours.  ProBNP (last 3 results) No results for input(s): PROBNP in the last 8760 hours.  CBG: No results for input(s): GLUCAP in the last 168 hours.  Radiological Exams on Admission: Ct Abdomen Pelvis W Contrast  05/29/2016  CLINICAL DATA:  Ten day history abdominal pain with vomiting and diarrhea. Hypertension. EXAM: CT ABDOMEN AND PELVIS WITH CONTRAST TECHNIQUE: Multidetector CT imaging of the abdomen and pelvis was performed using the standard protocol following bolus administration of intravenous contrast. CONTRAST:  149mL ISOVUE-300 IOPAMIDOL (ISOVUE-300) INJECTION 61% COMPARISON:  None. FINDINGS: Lower chest: There is slight right lower lobe atelectatic change. No lung base edema or consolidation. There is a small calcified granuloma in the posterior segment of the right lower lobe. Hepatobiliary: No focal liver lesions are evident. Gallbladder wall is not appreciably thickened. There is no biliary duct dilatation. Pancreas: There is no pancreatic mass or inflammatory focus. Spleen: No splenic lesions are evident. Adrenals/Urinary Tract: Adrenals appear normal bilaterally. The right kidney shows areas of scarring and renal cortical thinning. Right kidney is significantly smaller than left kidney. There is a 9 x 7 mm cyst arising from the upper pole of the right kidney. There is no hydronephrosis on either side. There is no appreciable renal or ureteral calculus on either side. Urinary bladder is midline with wall thickness within normal limits. Stomach/Bowel: Most bowel loops are fluid filled. There is no bowel wall or mesenteric thickening. There is no appreciable  bowel obstruction. No free air or portal venous air. Vascular/Lymphatic: There are scattered foci of calcification in the aorta and left common iliac artery. A small amount of peripheral thrombus is noted in the distal aorta. There is no abdominal aortic aneurysm. Major mesenteric vessels appear patent. The right renal artery appears diminutive. Reproductive: Uterus is anteverted. There is no pelvic mass or pelvic fluid collection. Other: Appendix appears normal. There is no abscess or ascites in the abdomen or pelvis. There is a small ventral hernia containing only fat. Musculoskeletal: There are no blastic or lytic bone lesions. There is questionable old trauma involving the medial left ischium with degenerative change in the pubic symphysis region. There is no intramuscular or abdominal wall lesion. IMPRESSION: Most bowel loops are fluid filled. While this finding Dooner be seen normally, it Acocella be indicative of early ileus or enteritis. There is no appreciable bowel wall thickening. No bowel obstruction. No abscess.  Appendix  appears normal. Right kidney appears atrophic compared to the left kidney. Question a degree of renal artery stenosis on the right. No renal or ureteral calculus. No hydronephrosis. Aortic atherosclerosis. Small ventral hernia containing only fat. Electronically Signed   By: Lowella Grip III M.D.   On: 05/29/2016 09:25   US Abdomen Limited  05/27/2016  CLINICAL DATA:  Right upper quadrant abdomen pain for 1 week EXAM: US ABDOMEN LIMITED - RIGHT UPPER QUADRANT COMPARISON:  None. FINDINGS: Gallbladder: No gallstones or wall thickening visualized. No sonographic Murphy sign noted by sonographer. Common bile duct: Diameter: 2.3 mm Liver: No focal lesion identified. There is diffuse increased echotexture of the liver. IMPRESSION: Normal gallbladder. Fatty infiltration of liver. Electronically Signed   By: Abelardo Diesel M.D.   On: 05/27/2016 20:26    Assessment/Plan Present on Admission:   . Vomiting and diarrhea . Diarrhea Active Problems:   Vomiting and diarrhea - with leukocytosis, copious diarrhea and working in hospital there is some concern for Cdiff colitis. My suspicion for this is relatively low but we will admit her under observation status to keep her hydrated and provide supportive care. Her wbc elevation could be demargination from the vomiting. We will hydrate her, check Cdiff and start her on empiric Flagyl IV in the mean time. Clear liquid diet for now, to advance as she feels better.   HTN (hypertension), benign - cont home meds, but hold Lasix as I think she is marginally dehydrated.  DVT prophylaxis: SCDs for obs patient   Code Status: FULL   Family Communication: None   Disposition Plan: Home in 24-48 hours.   Consults called: None   Admission status: Obs   Time spent: 42 minutes  Reya Aurich Marry Guan MD [facility] Pager [phone]  If 7PM-7AM, please contact night-coverage [email or web address] Password [email or web address]  05/29/2016, 11:10 AM

## 2016-05-29 NOTE — ED Notes (Signed)
Bed: WA06 Expected date:  Expected time:  Means of arrival:  Comments: Nurse from floor/diarrhea

## 2016-05-30 DIAGNOSIS — R1110 Vomiting, unspecified: Secondary | ICD-10-CM | POA: Diagnosis not present

## 2016-05-30 DIAGNOSIS — R197 Diarrhea, unspecified: Secondary | ICD-10-CM | POA: Diagnosis not present

## 2016-05-30 LAB — CBC WITH DIFFERENTIAL/PLATELET
Basophils Absolute: 0 10*3/uL (ref 0.0–0.1)
Basophils Relative: 0 %
Eosinophils Absolute: 0.2 10*3/uL (ref 0.0–0.7)
Eosinophils Relative: 2 %
HCT: 38.4 % (ref 36.0–46.0)
Hemoglobin: 12.5 g/dL (ref 12.0–15.0)
Lymphocytes Relative: 38 %
Lymphs Abs: 3.3 10*3/uL (ref 0.7–4.0)
MCH: 28.9 pg (ref 26.0–34.0)
MCHC: 32.6 g/dL (ref 30.0–36.0)
MCV: 88.7 fL (ref 78.0–100.0)
Monocytes Absolute: 0.5 10*3/uL (ref 0.1–1.0)
Monocytes Relative: 6 %
Neutro Abs: 4.6 10*3/uL (ref 1.7–7.7)
Neutrophils Relative %: 54 %
Platelets: 305 10*3/uL (ref 150–400)
RBC: 4.33 MIL/uL (ref 3.87–5.11)
RDW: 13.8 % (ref 11.5–15.5)
WBC: 8.6 10*3/uL (ref 4.0–10.5)

## 2016-05-30 MED ORDER — ONDANSETRON HCL 4 MG PO TABS: 4.0000 mg | ORAL_TABLET | Freq: Four times a day (QID) | ORAL | Status: DC | PRN

## 2016-05-30 NOTE — Discharge Summary (Signed)
  History and Physical  Michelle Ray [id number] DOB: 03-20-1966 DOA: 05/29/2016    Subjective: Patient feels much better.No abdominal pain. No nausea or vomiting since last night. She just had a meal and tolerate that well. No diarrhea. She had a bowel movement with firm stool. No fever.   Review of Systems:  Neg except as above  Physical Exam: BP 101/62 mmHg  Pulse 68  Temp(Src) 97.4 F (36.3 C) (Oral)  Resp 18  Ht 5\' 3"  (1.6 m)  Wt 104.327 kg (230 lb)  BMI 40.75 kg/m2  SpO2 99%  LMP 03/29/2016  GENERAL :   Alert and cooperative, and appears to be in no acute distress. ABDOMEN: Positive bowel sounds. Soft, nondistended, nontender. No guarding or rebound.         Assessment/Plan  Gastroenteritis:  Patient likely had viral gastroenteritis  Leukocytosis resolved Patient had a formed BM this am, tolerating PO well, without N/V Will dc home with zofran PO as needed. She will return if symptoms recur/worsen     Gennaro Africa M.D [facility]

## 2016-05-30 NOTE — Progress Notes (Signed)
Discharge teaching completed with teach back. Pt. to pick up Zofran prescription at her [facility] pharmacy. Follow up appointments listed and pt. understands dates and times. Discharge handout given and reviewed with pt. Answered questions.

## 2016-05-30 NOTE — Progress Notes (Signed)
Pt. Discharge to home, left via ambulation with NT. No shortness of breath noted.

## 2016-06-08 DIAGNOSIS — R1011 Right upper quadrant pain: Secondary | ICD-10-CM | POA: Diagnosis not present

## 2016-06-08 DIAGNOSIS — Z09 Encounter for follow-up examination after completed treatment for conditions other than malignant neoplasm: Secondary | ICD-10-CM | POA: Diagnosis not present

## 2016-06-08 DIAGNOSIS — Z79899 Other long term (current) drug therapy: Secondary | ICD-10-CM | POA: Diagnosis not present

## 2016-06-08 DIAGNOSIS — R112 Nausea with vomiting, unspecified: Secondary | ICD-10-CM | POA: Diagnosis not present

## 2016-06-08 DIAGNOSIS — K2970 Gastritis, unspecified, without bleeding: Secondary | ICD-10-CM | POA: Diagnosis not present

## 2016-06-16 ENCOUNTER — Other Ambulatory Visit (HOSPITAL_COMMUNITY): Payer: Self-pay | Admitting: Nurse Practitioner

## 2016-06-16 DIAGNOSIS — R1011 Right upper quadrant pain: Secondary | ICD-10-CM

## 2016-06-29 ENCOUNTER — Ambulatory Visit (HOSPITAL_COMMUNITY)
Admission: RE | Admit: 2016-06-29 | Discharge: 2016-06-29 | Disposition: A | Discharge status: Home / Self Care | Payer: BLUE CROSS/BLUE SHIELD | Source: Ambulatory Visit | Attending: Nurse Practitioner | Admitting: Nurse Practitioner

## 2016-06-29 DIAGNOSIS — R1011 Right upper quadrant pain: Secondary | ICD-10-CM | POA: Diagnosis not present

## 2016-06-29 MED ORDER — TECHNETIUM TC 99M MEBROFENIN IV KIT
5.3000 | PACK | Freq: Once | INTRAVENOUS | Status: AC | PRN
  Administered 2016-06-29: 5.3 via INTRAVENOUS

## 2016-07-01 ENCOUNTER — Encounter: Payer: Self-pay | Admitting: Gastroenterology

## 2016-07-01 ENCOUNTER — Ambulatory Visit (AMBULATORY_SURGERY_CENTER): Payer: Self-pay | Admitting: *Deleted

## 2016-07-01 VITALS — Ht 63.0 in | Wt 238.0 lb

## 2016-07-01 DIAGNOSIS — Z1211 Encounter for screening for malignant neoplasm of colon: Secondary | ICD-10-CM

## 2016-07-01 MED ORDER — NA SULFATE-K SULFATE-MG SULF 17.5-3.13-1.6 GM/177ML PO SOLN: 1.0000 | Freq: Once | ORAL | 0 refills | Status: AC

## 2016-07-01 NOTE — Progress Notes (Signed)
No egg or soy allergy known to patient  No issues with past sedation with any surgeries  or procedures, no intubation problems  No diet pills per patient No home 02 use per patient  No blood thinners per patient  Pt states  issues with constipation on occasion- uses OTC prn stool softener

## 2016-07-13 ENCOUNTER — Encounter: Payer: Self-pay | Admitting: Gastroenterology

## 2016-07-13 ENCOUNTER — Ambulatory Visit (AMBULATORY_SURGERY_CENTER): Payer: BLUE CROSS/BLUE SHIELD | Admitting: Gastroenterology

## 2016-07-13 VITALS — BP 146/83 | HR 72 | Temp 98.7°F | Resp 20 | Ht 63.0 in | Wt 238.0 lb

## 2016-07-13 DIAGNOSIS — Z1211 Encounter for screening for malignant neoplasm of colon: Secondary | ICD-10-CM

## 2016-07-13 DIAGNOSIS — D122 Benign neoplasm of ascending colon: Secondary | ICD-10-CM | POA: Diagnosis not present

## 2016-07-13 DIAGNOSIS — D123 Benign neoplasm of transverse colon: Secondary | ICD-10-CM

## 2016-07-13 MED ORDER — SODIUM CHLORIDE 0.9 % IV SOLN: 500.0000 mL | INTRAVENOUS | Status: DC

## 2016-07-13 NOTE — Progress Notes (Signed)
Called to room to assist during endoscopic procedure.  Patient ID and intended procedure confirmed with present staff. Received instructions for my participation in the procedure from the performing physician.  

## 2016-07-13 NOTE — Progress Notes (Signed)
Report to PACU, RN, vss, BBS= Clear.  

## 2016-07-13 NOTE — Op Note (Signed)
[facility] Patient Name: Michelle Ray Procedure Date: 07/13/2016 8:06 AM MRN: [id number] Endoscopist: Mauri Pole , MD Age: 50 Referring MD:  Date of Birth: 1966-09-10 Gender: Female Account #: [id number] Procedure:                Colonoscopy Indications:              Screening for colorectal malignant neoplasm, This                            is the patient's first colonoscopy Medicines:                Monitored Anesthesia Care Procedure:                Pre-Anesthesia Assessment:                           - Prior to the procedure, a History and Physical                            was performed, and patient medications and                            allergies were reviewed. The patient's tolerance of                            previous anesthesia was also reviewed. The risks                            and benefits of the procedure and the sedation                            options and risks were discussed with the patient.                            All questions were answered, and informed consent                            was obtained. Prior Anticoagulants: The patient has                            taken no previous anticoagulant or antiplatelet                            agents. ASA Grade Assessment: II - A patient with                            mild systemic disease. After reviewing the risks                            and benefits, the patient was deemed in                            satisfactory condition to undergo the procedure.  After obtaining informed consent, the colonoscope                            was passed under direct vision. Throughout the                            procedure, the patient's blood pressure, pulse, and                            oxygen saturations were monitored continuously. The                            Model CF-HQ190L [id number]) scope was introduced                            through the anus and  advanced to the the cecum,                            identified by appendiceal orifice and ileocecal                            valve. The colonoscopy was performed without                            difficulty. The patient tolerated the procedure                            well. The quality of the bowel preparation was                            good. The ileocecal valve, appendiceal orifice, and                            rectum were photographed. Scope In: 8:14:56 AM Scope Out: 8:36:51 AM Scope Withdrawal Time: 0 hours 17 minutes 28 seconds  Total Procedure Duration: 0 hours 21 minutes 55 seconds  Findings:                 The perianal and digital rectal examinations were                            normal.                           Two sessile polyps were found in the transverse                            colon and ascending colon. The polyps were 5 to 9                            mm in size. These polyps were removed with a cold                            snare. Resection and retrieval were complete.  A 12 mm polyp was found in the transverse colon.                            The polyp was sessile. The polyp was removed with a                            hot snare. The polyp was removed with a piecemeal                            technique using a hot snare. Resection and                            retrieval were complete.                           Non-bleeding internal hemorrhoids were found during                            retroflexion. The hemorrhoids were small. Complications:            No immediate complications. Estimated Blood Loss:     Estimated blood loss was minimal. Impression:               - Two 5 to 9 mm polyps in the transverse colon and                            in the ascending colon, removed with a cold snare.                            Resected and retrieved.                           - One 12 mm polyp in the transverse colon, removed                             with a hot snare and removed piecemeal using a hot                            snare. Resected and retrieved.                           - Non-bleeding internal hemorrhoids. Recommendation:           - Patient has a contact number available for                            emergencies. The signs and symptoms of potential                            delayed complications were discussed with the                            patient. Return to normal activities tomorrow.  Written discharge instructions were provided to the                            patient.                           - Resume previous diet.                           - Continue present medications.                           - Await pathology results.                           - Repeat colonoscopy in 1 year for surveillance                            after piecemeal polypectomy.                           - Return to GI clinic PRN. Mauri Pole, MD 07/13/2016 8:44:11 AM This report has been signed electronically.

## 2016-07-13 NOTE — Patient Instructions (Signed)
Colon polyps removed today. Handout given on polyps,hemorrhoids.  Repeat colonoscopy in 1 year. No NSAIDS medications for 1 week to 10 days.  Result letter in your mail in 1-2 weeks.  Call us with any questions or concerns. Thank you!   YOU HAD AN ENDOSCOPIC PROCEDURE TODAY AT [facility]:   Refer to the procedure report that was given to you for any specific questions about what was found during the examination.  If the procedure report does not answer your questions, please call your gastroenterologist to clarify.  If you requested that your care partner not be given the details of your procedure findings, then the procedure report has been included in a sealed envelope for you to review at your convenience later.  YOU SHOULD EXPECT: Some feelings of bloating in the abdomen. Passage of more gas than usual.  Walking can help get rid of the air that was put into your GI tract during the procedure and reduce the bloating. If you had a lower endoscopy (such as a colonoscopy or flexible sigmoidoscopy) you Fogal notice spotting of blood in your stool or on the toilet paper. If you underwent a bowel prep for your procedure, you Steffy not have a normal bowel movement for a few days.  Please Note:  You might notice some irritation and congestion in your nose or some drainage.  This is from the oxygen used during your procedure.  There is no need for concern and it should clear up in a day or so.  SYMPTOMS TO REPORT IMMEDIATELY:   Following lower endoscopy (colonoscopy or flexible sigmoidoscopy):  Excessive amounts of blood in the stool  Significant tenderness or worsening of abdominal pains  Swelling of the abdomen that is new, acute  Fever of 100F or higher   For urgent or emergent issues, a gastroenterologist can be reached at any hour by calling [phone].   DIET: Your first meal following the procedure should be a small meal and then it is ok to progress to your normal diet.  Heavy or fried foods are harder to digest and Lohnes make you feel nauseous or bloated.  Likewise, meals heavy in dairy and vegetables can increase bloating.  Drink plenty of fluids but you should avoid alcoholic beverages for 24 hours.  ACTIVITY:  You should plan to take it easy for the rest of today and you should NOT DRIVE or use heavy machinery until tomorrow (because of the sedation medicines used during the test).    FOLLOW UP: Our staff will call the number listed on your records the next business day following your procedure to check on you and address any questions or concerns that you Bekele have regarding the information given to you following your procedure. If we do not reach you, we will leave a message.  However, if you are feeling well and you are not experiencing any problems, there is no need to return our call.  We will assume that you have returned to your regular daily activities without incident.  If any biopsies were taken you will be contacted by phone or by letter within the next 1-3 weeks.  Please call us at [phone] if you have not heard about the biopsies in 3 weeks.    SIGNATURES/CONFIDENTIALITY: You and/or your care partner have signed paperwork which will be entered into your electronic medical record.  These signatures attest to the fact that that the information above on your After Visit Summary has been reviewed  and is understood.  Full responsibility of the confidentiality of this discharge information lies with you and/or your care-partner. 

## 2016-07-14 ENCOUNTER — Telehealth: Payer: Self-pay | Admitting: *Deleted

## 2016-07-14 NOTE — Telephone Encounter (Signed)
  Follow up Call-  Call back number 07/13/2016  Post procedure Call Back phone  # [phone]  Permission to leave phone message Yes     No answer, left message to call if questions or concerns.

## 2016-07-21 ENCOUNTER — Encounter: Payer: Self-pay | Admitting: Gastroenterology

## 2016-08-06 DIAGNOSIS — E785 Hyperlipidemia, unspecified: Secondary | ICD-10-CM | POA: Diagnosis not present

## 2016-08-06 DIAGNOSIS — I10 Essential (primary) hypertension: Secondary | ICD-10-CM | POA: Diagnosis not present

## 2016-08-06 DIAGNOSIS — R110 Nausea: Secondary | ICD-10-CM | POA: Diagnosis not present

## 2016-08-06 DIAGNOSIS — R1011 Right upper quadrant pain: Secondary | ICD-10-CM | POA: Diagnosis not present

## 2016-10-20 ENCOUNTER — Ambulatory Visit (INDEPENDENT_AMBULATORY_CARE_PROVIDER_SITE_OTHER): Payer: BLUE CROSS/BLUE SHIELD | Admitting: Gastroenterology

## 2016-10-20 ENCOUNTER — Encounter: Payer: Self-pay | Admitting: Gastroenterology

## 2016-10-20 VITALS — BP 122/70 | HR 80 | Ht 63.0 in | Wt 243.0 lb

## 2016-10-20 DIAGNOSIS — K219 Gastro-esophageal reflux disease without esophagitis: Secondary | ICD-10-CM | POA: Diagnosis not present

## 2016-10-20 DIAGNOSIS — R1011 Right upper quadrant pain: Secondary | ICD-10-CM

## 2016-10-20 DIAGNOSIS — R140 Abdominal distension (gaseous): Secondary | ICD-10-CM | POA: Diagnosis not present

## 2016-10-20 DIAGNOSIS — I701 Atherosclerosis of renal artery: Secondary | ICD-10-CM

## 2016-10-20 DIAGNOSIS — R1310 Dysphagia, unspecified: Secondary | ICD-10-CM | POA: Diagnosis not present

## 2016-10-20 DIAGNOSIS — N261 Atrophy of kidney (terminal): Secondary | ICD-10-CM

## 2016-10-20 MED ORDER — RANITIDINE HCL 300 MG PO TABS: 300.0000 mg | ORAL_TABLET | Freq: Every day | ORAL | 3 refills | Status: DC

## 2016-10-20 MED ORDER — PANTOPRAZOLE SODIUM 40 MG PO TBEC: 40.0000 mg | DELAYED_RELEASE_TABLET | Freq: Every day | ORAL | 6 refills | Status: DC

## 2016-10-20 MED ORDER — RANITIDINE HCL 300 MG PO TABS: 300.0000 mg | ORAL_TABLET | Freq: Every day | ORAL | 6 refills | Status: DC

## 2016-10-20 NOTE — Progress Notes (Signed)
Michelle Ray    CP:[id number]    Oct 20, 1966  Primary Care Physician:Lynn Ky Barban, NP  Referring Physician: Philmore Pali, NP [place], [place] [address]  Chief complaint:  Right upper quadrant pain, bloating, abdominal fullness, constipation alternating with diarrhea  HPI: 50 year old female here for evaluation with complaints of intermittent right upper quadrant pain for past 2 years associated with bloating, excessive belching and fullness. No association with food or physical activity. She also complains of intermittent dysphagia to certain foods both solids and liquids where she chokes once in a while with water, corn and has also noticed symptoms with cauliflower. She has constant heartburn, she has tried Protonix did not help much and is currently on Zantac twice a day with improvement. She has alternating diarrhea with constipation, her diet is predominantly vegan  Patient had CT abdomen and pelvis with contrast in June 2017 for similar complaints was noted to have atrophic right kidney with question of renal artery stenosis, no hydronephrosis or renal calculi. So had multiple fluid-filled bowel loops with no bowel thickening or bowel obstruction could be normal finding versus early ileus. She has alternating diarrhea with constipation, her diet is predominantly vegan. Abdominal ultrasound in June 2017 was normal and HIDA scan in July 2017 was normal     Outpatient Encounter Prescriptions as of 10/20/2016  Medication Sig  . amLODipine (NORVASC) 10 MG tablet Take 10 mg by mouth daily.  . benazepril (LOTENSIN) 20 MG tablet Take 20 mg by mouth daily.  . cloNIDine (CATAPRES) 0.1 MG tablet Take 0.1 mg by mouth 3 (three) times daily as needed. High blood pressure  . Cyanocobalamin (VITAMIN B-12 PO) Take 1 tablet by mouth daily.  . furosemide (LASIX) 40 MG tablet Take 40 mg by mouth daily as needed for fluid or edema.   . ondansetron (ZOFRAN) 4 MG tablet Take 1 tablet (4  mg total) by mouth every 6 (six) hours as needed for nausea.  Marland Kitchen OVER THE COUNTER MEDICATION Take 1 tablet by mouth daily. OTC- Mega red supplement tablet  . pantoprazole (PROTONIX) 40 MG tablet Take 40 mg by mouth daily.  Marland Kitchen triamterene-hydrochlorothiazide (DYAZIDE) 37.5-25 MG capsule Take 1 capsule by mouth daily.   Facility-Administered Encounter Medications as of 10/20/2016  Medication  . 0.9 %  sodium chloride infusion    Allergies as of 10/20/2016  . (No Known Allergies)    Past Medical History:  Diagnosis Date  . Allergy   . Anemia   . Constipation    uses OTC stool softener prn only- this is not chronic  . GERD (gastroesophageal reflux disease)   . Hyperlipidemia   . Hypertension     Past Surgical History:  Procedure Laterality Date  . ABLATION    . TUBAL LIGATION    . uterine fibroid removal    . WISDOM TOOTH EXTRACTION      Family History  Problem Relation Age of Onset  . Cancer Mother   . Atrial fibrillation Mother   . Pulmonary embolism Mother   . Heart failure Sister   . Colon cancer Neg Hx   . Colon polyps Neg Hx     Social History   Social History  . Marital status: Married    Spouse name: N/A  . Number of children: N/A  . Years of education: N/A   Occupational History  . Not on file.   Social History Main Topics  . Smoking  status: Former Research scientist (life sciences)  . Smokeless tobacco: Never Used  . Alcohol use No  . Drug use: No  . Sexual activity: Not on file   Other Topics Concern  . Not on file   Social History Narrative  . No narrative on file      Review of systems: Review of Systems  Constitutional: Negative for fever and chills.  HENT: Negative.   Eyes: Negative for blurred vision.  Respiratory: Negative for cough, shortness of breath and wheezing.   Cardiovascular: Negative for chest pain and palpitations.  Gastrointestinal: as per HPI Genitourinary: Negative for dysuria, urgency, frequency and hematuria.  Musculoskeletal: Positive for  myalgias, back pain and joint pain.  Skin: Negative for itching and rash.  Neurological: Negative for dizziness, tremors, focal weakness, seizures and loss of consciousness.  Endo/Heme/Allergies: Positive for seasonal allergies.  Psychiatric/Behavioral: Negative for depression, suicidal ideas and hallucinations.  All other systems reviewed and are negative.   Physical Exam: Vitals:   10/20/16 0948  BP: 122/70  Pulse: 80   Body mass index is 43.05 kg/m. Gen:      No acute distress HEENT:  EOMI, sclera anicteric Neck:     No masses; no thyromegaly Lungs:    Clear to auscultation bilaterally; normal respiratory effort CV:         Regular rate and rhythm; no murmurs Abd:      + bowel sounds; soft, non-tender; no palpable masses, no distension Ext:    No edema; adequate peripheral perfusion Skin:      Warm and dry; no rash Neuro: alert and oriented x 3 Psych: normal mood and affect  Data Reviewed:  Reviewed labs, radiology imaging, old records and pertinent past GI work up   Assessment and Plan/Recommendations:  50 year old female with chronic GERD, intermittent dysphagia, intermittent right upper quadrant pain associated with bloating and excessive Belching.  Right upper quadrant pain associated with bloating and excessive belching: Schedule for upper endoscopy will also obtain duodenal biopsies to exclude sprue The risks and benefits as well as alternatives of endoscopic procedure(s) have been discussed and reviewed. All questions answered. The patient agrees to proceed.  Right renal atrophy Leppla be playing some role in the right side abdominal pain, we'll obtain renal ultrasound with Dopplers to evaluate for possible renal artery stenosis  GERD:  Start protonic 40 mg 30 minutes before breakfast and Zantac 300 mg at bedtime  Reflux measures   Alternating constipation with diarrhea likely irritable bowel syndrome Start probiotic daily and Benefiber 1 tablespoon 3 times a day  with meals Avoid excessive fiber and trial of lactose-free diet   Greater than 50% of the time used for counseling as well as treatment plan and follow-up. She had multiple questions which were answered to her satisfaction  K. Denzil Magnuson , MD [phone] Mon-Fri 8a-5p [phone] after 5p, weekends, holidays  CC: Philmore Pali, NP

## 2016-10-20 NOTE — Patient Instructions (Addendum)
You have been scheduled for an endoscopy. Please follow written instructions given to you at your visit today. If you use inhalers (even only as needed), please bring them with you on the day of your procedure. Your physician has requested that you go to www.startemmi.com and enter the access code given to you at your visit today. This web site gives a general overview about your procedure. However, you should still follow specific instructions given to you by our office regarding your preparation for the procedure.  Probiotic daily  Take Protonix 40 mg daily 30 minutes before breakfast  Zantac 300 mg at bedtime  Your Renal Ultrasound is scheduled on 10/27/2016 at 7:30am With Full bladder arrive at 7:15 am

## 2016-10-27 ENCOUNTER — Other Ambulatory Visit: Payer: Self-pay | Admitting: *Deleted

## 2016-10-27 ENCOUNTER — Telehealth: Payer: Self-pay | Admitting: *Deleted

## 2016-10-27 ENCOUNTER — Encounter (HOSPITAL_COMMUNITY): Payer: Self-pay

## 2016-10-27 ENCOUNTER — Ambulatory Visit (HOSPITAL_COMMUNITY)
Admission: RE | Admit: 2016-10-27 | Discharge: 2016-10-27 | Disposition: A | Discharge status: Home / Self Care | Payer: BLUE CROSS/BLUE SHIELD | Source: Ambulatory Visit | Attending: Gastroenterology | Admitting: Gastroenterology

## 2016-10-27 DIAGNOSIS — I701 Atherosclerosis of renal artery: Secondary | ICD-10-CM

## 2016-10-27 DIAGNOSIS — R140 Abdominal distension (gaseous): Secondary | ICD-10-CM

## 2016-10-27 DIAGNOSIS — R1310 Dysphagia, unspecified: Secondary | ICD-10-CM

## 2016-10-27 DIAGNOSIS — K219 Gastro-esophageal reflux disease without esophagitis: Secondary | ICD-10-CM

## 2016-10-27 DIAGNOSIS — R109 Unspecified abdominal pain: Secondary | ICD-10-CM

## 2016-10-27 DIAGNOSIS — R1011 Right upper quadrant pain: Secondary | ICD-10-CM

## 2016-10-27 DIAGNOSIS — I10 Essential (primary) hypertension: Secondary | ICD-10-CM

## 2016-10-27 DIAGNOSIS — N261 Atrophy of kidney (terminal): Secondary | ICD-10-CM

## 2016-10-27 NOTE — Telephone Encounter (Signed)
===  View-only below this line===  ----- Message ----- From: Mauri Pole, MD Sent: 10/27/2016  12:53 PM To: Oda Kilts, CMA Subject: RE: Renal with Doppler                         Done, please use renal atrophy and renal artery stenosis. Abnormal CT as codes Thanks VN ----- Message ----- From: Oda Kilts, CMA Sent: 10/27/2016  10:17 AM To: Mauri Pole, MD Subject: Renal with Doppler                             Dr Silverio Decamp Can you finish your note from 11/21  Im trying to schedule the patient for her renal with doppler and they are saying the dx of abdominal pain (RUQ pain) will not cover the order for this test.. So I couldn't schedule this yet  Vascular lab is telling me that usually hypertension is used for the dx for this test.  Can you gice me correcr dx  Thanks     Called patient and left instructions for test   NPO after 11am except a little water is ok Avoid high fiber foods for 24 hours No Chewing gum Sanor take AM meds with a small sip of water the AM of the test Arrrive 15 minutes early   [employer] at [place] to [employer]  parking

## 2016-10-30 ENCOUNTER — Ambulatory Visit (AMBULATORY_SURGERY_CENTER): Payer: BLUE CROSS/BLUE SHIELD | Admitting: Gastroenterology

## 2016-10-30 ENCOUNTER — Encounter: Payer: Self-pay | Admitting: Gastroenterology

## 2016-10-30 VITALS — BP 116/65 | HR 65 | Temp 99.1°F | Resp 19 | Ht 63.0 in | Wt 242.0 lb

## 2016-10-30 DIAGNOSIS — R1011 Right upper quadrant pain: Secondary | ICD-10-CM | POA: Diagnosis not present

## 2016-10-30 DIAGNOSIS — R1013 Epigastric pain: Secondary | ICD-10-CM | POA: Diagnosis not present

## 2016-10-30 DIAGNOSIS — K219 Gastro-esophageal reflux disease without esophagitis: Secondary | ICD-10-CM | POA: Diagnosis not present

## 2016-10-30 DIAGNOSIS — R1310 Dysphagia, unspecified: Secondary | ICD-10-CM | POA: Diagnosis not present

## 2016-10-30 MED ORDER — SODIUM CHLORIDE 0.9 % IV SOLN: 500.0000 mL | INTRAVENOUS | Status: DC

## 2016-10-30 NOTE — Progress Notes (Signed)
Called to room to assist during endoscopic procedure.  Patient ID and intended procedure confirmed with present staff. Received instructions for my participation in the procedure from the performing physician.  

## 2016-10-30 NOTE — Patient Instructions (Signed)
YOU HAD AN ENDOSCOPIC PROCEDURE TODAY AT [facility]:   Refer to the procedure report that was given to you for any specific questions about what was found during the examination.  If the procedure report does not answer your questions, please call your gastroenterologist to clarify.  If you requested that your care partner not be given the details of your procedure findings, then the procedure report has been included in a sealed envelope for you to review at your convenience later.  YOU SHOULD EXPECT: Some feelings of bloating in the abdomen. Passage of more gas than usual.  Walking can help get rid of the air that was put into your GI tract during the procedure and reduce the bloating. If you had a lower endoscopy (such as a colonoscopy or flexible sigmoidoscopy) you Hyder notice spotting of blood in your stool or on the toilet paper. If you underwent a bowel prep for your procedure, you [place] not have a normal bowel movement for a few days.  Please Note:  You might notice some irritation and congestion in your nose or some drainage.  This is from the oxygen used during your procedure.  There is no need for concern and it should clear up in a day or so.  SYMPTOMS TO REPORT IMMEDIATELY:    Following upper endoscopy (EGD)  Vomiting of blood or coffee ground material  New chest pain or pain under the shoulder blades  Painful or persistently difficult swallowing  New shortness of breath  Fever of 100F or higher  Black, tarry-looking stools  For urgent or emergent issues, a gastroenterologist can be reached at any hour by calling [phone].   DIET:  We do recommend a small meal at first, but then you [place] proceed to your regular diet.  Drink plenty of fluids but you should avoid alcoholic beverages for 24 hours.  ACTIVITY:  You should plan to take it easy for the rest of today and you should NOT DRIVE or use heavy machinery until tomorrow (because of the sedation medicines used  during the test).    FOLLOW UP: Our staff will call the number listed on your records the next business day following your procedure to check on you and address any questions or concerns that you [place] have regarding the information given to you following your procedure. If we do not reach you, we will leave a message.  However, if you are feeling well and you are not experiencing any problems, there is no need to return our call.  We will assume that you have returned to your regular daily activities without incident.  If any biopsies were taken you will be contacted by phone or by letter within the next 1-3 weeks.  Please call us at [phone] if you have not heard about the biopsies in 3 weeks.    SIGNATURES/CONFIDENTIALITY: You and/or your care partner have signed paperwork which will be entered into your electronic medical record.  These signatures attest to the fact that that the information above on your After Visit Summary has been reviewed and is understood.  Full responsibility of the confidentiality of this discharge information lies with you and/or your care-partner.  Wait pathology results.  Repeat upper endoscopy will be determined by pathology.  Return to GI clinic as needed.  Continue current medications.

## 2016-10-30 NOTE — Op Note (Signed)
[facility] Patient Name: Michelle Ray Procedure Date: 10/30/2016 3:19 PM MRN: [id number] Endoscopist: Mauri Pole , MD Age: 50 Referring MD:  Date of Birth: 1966-05-09 Gender: Female Account #: [id number] Procedure:                Upper GI endoscopy Indications:              Dysphagia, Abdominal pain in the right upper                            quadrant, Dyspepsia Medicines:                Monitored Anesthesia Care Procedure:                Pre-Anesthesia Assessment:                           - Prior to the procedure, a History and Physical                            was performed, and patient medications and                            allergies were reviewed. The patient's tolerance of                            previous anesthesia was also reviewed. The risks                            and benefits of the procedure and the sedation                            options and risks were discussed with the patient.                            All questions were answered, and informed consent                            was obtained. Prior Anticoagulants: The patient has                            taken no previous anticoagulant or antiplatelet                            agents. ASA Grade Assessment: II - A patient with                            mild systemic disease. After reviewing the risks                            and benefits, the patient was deemed in                            satisfactory condition to undergo the procedure.  After obtaining informed consent, the endoscope was                            passed under direct vision. Throughout the                            procedure, the patient's blood pressure, pulse, and                            oxygen saturations were monitored continuously. The                            Model GIF-HQ190 [id number]) scope was introduced                            through the mouth, and advanced to  the second part                            of duodenum. The upper GI endoscopy was                            accomplished without difficulty. The patient                            tolerated the procedure well. Scope In: Scope Out: Findings:                 There were esophageal mucosal changes suspicious                            for short-segment single tongue of Barrett's                            esophagus present in the lower third of the                            esophagus. The maximum longitudinal extent of these                            mucosal changes was 2 cm in length. Mucosa was                            biopsied with a cold forceps for histology in a                            targeted manner at intervals of 1 cm from 34 to 36                            cm from the incisors. One specimen bottle was sent                            to pathology.  The stomach was normal.                           The duodenal bulb and second portion of the                            duodenum were normal. Biopsies for histology were                            taken with a cold forceps for evaluation of celiac                            disease. Complications:            No immediate complications. Estimated Blood Loss:     Estimated blood loss was minimal. Impression:               - Esophageal mucosal changes suspicious for                            short-segment Barrett's esophagus. Biopsied.                           - Normal stomach.                           - Normal duodenal bulb and second portion of the                            duodenum. Biopsied. Recommendation:           - Patient has a contact number available for                            emergencies. The signs and symptoms of potential                            delayed complications were discussed with the                            patient. Return to normal activities tomorrow.                             Written discharge instructions were provided to the                            patient.                           - Resume previous diet.                           - Continue present medications.                           - Await pathology results.                           -  Repeat upper endoscopy after studies are complete                            for surveillance based on pathology results.                           - Return to GI clinic PRN. Mauri Pole, MD 10/30/2016 3:46:44 PM This report has been signed electronically.

## 2016-10-30 NOTE — Progress Notes (Signed)
To recovery, report to Mirts, RN, VSS. 

## 2016-11-02 ENCOUNTER — Telehealth: Payer: Self-pay

## 2016-11-02 NOTE — Telephone Encounter (Incomplete)
  Follow up Call-  Call back number 10/30/2016 07/13/2016  Post procedure Call Back phone  # [phone]  Permission to leave phone message Yes Yes     Patient questions:  Do you have a fever, pain , or abdominal swelling? {yes no:314532} Pain Score  {NUMBERS; 0-10:5044} *  Have you tolerated food without any problems? {yes no:314532}  Have you been able to return to your normal activities? {yes no:314532}  Do you have any questions about your discharge instructions: Diet   {yes no:314532} Medications  {yes no:314532} Follow up visit  {yes no:314532}  Do you have questions or concerns about your Care? {yes no:314532}  Actions: * If pain score is 4 or above: {ACTION; [facility] ENDO PAIN >4:21563::"No action needed, pain <4."}

## 2016-11-02 NOTE — Telephone Encounter (Signed)
Attmpted to reach pt. For follow up call following procedure on 10/30/16.  Wrong no.   No other valid no. Given.

## 2016-11-03 ENCOUNTER — Ambulatory Visit (HOSPITAL_COMMUNITY)
Admission: RE | Admit: 2016-11-03 | Discharge: 2016-11-03 | Disposition: A | Discharge status: Home / Self Care | Payer: BLUE CROSS/BLUE SHIELD | Source: Ambulatory Visit | Attending: Gastroenterology | Admitting: Gastroenterology

## 2016-11-03 DIAGNOSIS — N261 Atrophy of kidney (terminal): Secondary | ICD-10-CM | POA: Insufficient documentation

## 2016-11-03 DIAGNOSIS — R109 Unspecified abdominal pain: Secondary | ICD-10-CM | POA: Insufficient documentation

## 2016-11-03 DIAGNOSIS — I701 Atherosclerosis of renal artery: Secondary | ICD-10-CM | POA: Diagnosis not present

## 2016-11-03 DIAGNOSIS — R93421 Abnormal radiologic findings on diagnostic imaging of right kidney: Secondary | ICD-10-CM | POA: Diagnosis not present

## 2016-11-03 DIAGNOSIS — I10 Essential (primary) hypertension: Secondary | ICD-10-CM | POA: Diagnosis not present

## 2016-11-03 NOTE — Progress Notes (Addendum)
*  PRELIMINARY RESULTS* Vascular Ultrasound Renal Artery Duplex has been completed.   There is no obvious evidence of hemodynamically significant renal artery stenosis bilaterally.  Intrarenal resistive indices are elevated bilaterally, etiology unknown. The right kidney is 3.6cm smaller than the left kidney; etiology unknown.  There are multiple anechoic areas of the right kidney, largest measuring 1.2cm, suggestive of possible simple cysts.  11/03/2016 9:53 AM Maudry Mayhew, BS, RVT, RDCS, RDMS

## 2016-11-05 ENCOUNTER — Telehealth: Payer: Self-pay | Admitting: Gastroenterology

## 2016-11-05 NOTE — Telephone Encounter (Signed)
Left message for patient to call back  

## 2016-11-13 ENCOUNTER — Encounter: Payer: Self-pay | Admitting: Gastroenterology

## 2017-01-27 ENCOUNTER — Ambulatory Visit: Payer: BLUE CROSS/BLUE SHIELD | Admitting: Gastroenterology

## 2017-02-12 ENCOUNTER — Telehealth: Payer: Self-pay | Admitting: *Deleted

## 2017-02-12 MED ORDER — RANITIDINE HCL 300 MG PO TABS: 300.0000 mg | ORAL_TABLET | Freq: Every day | ORAL | 3 refills | Status: DC

## 2017-02-12 MED ORDER — PANTOPRAZOLE SODIUM 40 MG PO TBEC: 40.0000 mg | DELAYED_RELEASE_TABLET | Freq: Every day | ORAL | 3 refills | Status: DC

## 2017-02-12 NOTE — Telephone Encounter (Signed)
Sent in 90 day supply of pantoprazole and Zantac to [facility] per patients request faxed from pharmacy

## 2017-03-11 ENCOUNTER — Ambulatory Visit: Payer: BLUE CROSS/BLUE SHIELD | Admitting: Gastroenterology

## 2017-03-15 DIAGNOSIS — I10 Essential (primary) hypertension: Secondary | ICD-10-CM | POA: Diagnosis not present

## 2017-03-15 DIAGNOSIS — E785 Hyperlipidemia, unspecified: Secondary | ICD-10-CM | POA: Diagnosis not present

## 2017-03-15 DIAGNOSIS — Z6841 Body Mass Index (BMI) 40.0 and over, adult: Secondary | ICD-10-CM | POA: Diagnosis not present

## 2017-03-15 DIAGNOSIS — K219 Gastro-esophageal reflux disease without esophagitis: Secondary | ICD-10-CM | POA: Diagnosis not present

## 2017-04-30 ENCOUNTER — Encounter: Payer: Self-pay | Admitting: Gastroenterology

## 2017-04-30 ENCOUNTER — Ambulatory Visit (INDEPENDENT_AMBULATORY_CARE_PROVIDER_SITE_OTHER): Payer: BLUE CROSS/BLUE SHIELD | Admitting: Gastroenterology

## 2017-04-30 VITALS — BP 118/76 | HR 74 | Ht 63.0 in | Wt 251.6 lb

## 2017-04-30 DIAGNOSIS — R1013 Epigastric pain: Secondary | ICD-10-CM

## 2017-04-30 DIAGNOSIS — Z86010 Personal history of colonic polyps: Secondary | ICD-10-CM | POA: Diagnosis not present

## 2017-04-30 DIAGNOSIS — Z860101 Personal history of adenomatous and serrated colon polyps: Secondary | ICD-10-CM

## 2017-04-30 DIAGNOSIS — K219 Gastro-esophageal reflux disease without esophagitis: Secondary | ICD-10-CM

## 2017-04-30 DIAGNOSIS — K582 Mixed irritable bowel syndrome: Secondary | ICD-10-CM

## 2017-04-30 MED ORDER — AMBULATORY NON FORMULARY MEDICATION
0 refills | Status: DC
Start: 2017-04-30 — End: 2022-06-22

## 2017-04-30 NOTE — Patient Instructions (Signed)
Take IBgard 1-2 capsules by mouth three time daily as needed. This can be purchased over the counter. We have given you samples today # 3 boxes.

## 2017-04-30 NOTE — Progress Notes (Signed)
Michelle Ray    [id number]    07/02/1966  Primary Care Physician:Lam, Rudi Rummage, NP  Referring Physician: Philmore Pali, NP [place], [place] [address]  Chief complaint: GERD, epigastric abd pain  HPI: 51 year old female with history of GERD here for follow-up visit. She was last seen in office 10/20/2016. Underwent EGD December 2017 with findings of gastritis, reflux related esophagitis, and negative for Barrett's esophagus and biopsy. Colonoscopy August 2017 with removal of multiple tubular adenoma which is larger trouble millimeter polyp was removed piecemeal. GERD symptoms well controlled on PPI once daily and H2 blocker at bedtime with occasional breakthrough heartburn. She continues to have intermittent epigastric abdominal pain. Workup so far unrevealing for any significant pathology. Patient had abdominal ultrasound, HIDA scan and CT abdomen and pelvis in addition to endoscopic evaluation. She has intermittent constipation alternating with diarrhea once every few weeks. Denies any blood in stool, nausea or vomiting or weight change.   Outpatient Encounter Prescriptions as of 04/30/2017  Medication Sig  . amLODipine (NORVASC) 10 MG tablet Take 10 mg by mouth daily.  . benazepril (LOTENSIN) 20 MG tablet Take 20 mg by mouth daily.  . cloNIDine (CATAPRES) 0.1 MG tablet Take 0.1 mg by mouth 3 (three) times daily as needed. High blood pressure  . Cyanocobalamin (VITAMIN B-12 PO) Take 1 tablet by mouth daily.  . furosemide (LASIX) 40 MG tablet Take 40 mg by mouth daily as needed.   . ondansetron (ZOFRAN) 4 MG tablet Take 1 tablet (4 mg total) by mouth every 6 (six) hours as needed for nausea.  Marland Kitchen OVER THE COUNTER MEDICATION Take 1 tablet by mouth daily. OTC- Mega red supplement tablet  . pantoprazole (PROTONIX) 40 MG tablet Take 1 tablet (40 mg total) by mouth daily. 30 minutes before breakfast  . ranitidine (ZANTAC) 300 MG tablet Take 1 tablet (300 mg total) by mouth at  bedtime.  . rosuvastatin (CRESTOR) 5 MG tablet Take 5 mg by mouth daily.  Marland Kitchen triamterene-hydrochlorothiazide (DYAZIDE) 37.5-25 MG capsule Take 1 capsule by mouth daily.   Facility-Administered Encounter Medications as of 04/30/2017  Medication  . 0.9 %  sodium chloride infusion  . 0.9 %  sodium chloride infusion    Allergies as of 04/30/2017  . (No Known Allergies)    Past Medical History:  Diagnosis Date  . Allergy   . Anemia   . Constipation    uses OTC stool softener prn only- this is not chronic  . GERD (gastroesophageal reflux disease)   . Hyperlipemia   . Hyperlipidemia   . Hypertension     Past Surgical History:  Procedure Laterality Date  . ABLATION    . TUBAL LIGATION    . uterine fibroid removal    . WISDOM TOOTH EXTRACTION      Family History  Problem Relation Age of Onset  . Cancer Mother   . Atrial fibrillation Mother   . Pulmonary embolism Mother   . Heart failure Sister   . Colon cancer Neg Hx   . Colon polyps Neg Hx     Social History   Social History  . Marital status: Married    Spouse name: N/A  . Number of children: N/A  . Years of education: N/A   Occupational History  . Not on file.   Social History Main Topics  . Smoking status: Former Research scientist (life sciences)  . Smokeless tobacco: Never Used  . Alcohol use No  .  Drug use: No  . Sexual activity: Not on file   Other Topics Concern  . Not on file   Social History Narrative  . No narrative on file      Review of systems: Review of Systems  Constitutional: Negative for fever and chills.  positive for lack of energy HENT: Negative.   Eyes: Negative for blurred vision.  Respiratory: Negative for cough, shortness of breath and wheezing.   Cardiovascular: Negative for chest pain and palpitations.  Gastrointestinal: as per HPI Genitourinary: Negative for dysuria, urgency, frequency and hematuria.  Musculoskeletal: Negative for myalgias, back pain and joint pain.  Skin: Negative for itching and  rash.  Neurological: Negative for dizziness, tremors, focal weakness, seizures and loss of consciousness.  Endo/Heme/Allergies: Positive for seasonal allergies.  Psychiatric/Behavioral: Negative for depression, suicidal ideas and hallucinations.  All other systems reviewed and are negative.   Physical Exam: Vitals:   04/30/17 1352  BP: 118/76  Pulse: 74   Body mass index is 44.57 kg/m. Gen:      No acute distress HEENT:  EOMI, sclera anicteric Neck:     No masses; no thyromegaly Lungs:    Clear to auscultation bilaterally; normal respiratory effort CV:         Regular rate and rhythm; no murmurs Abd:      + bowel sounds; soft, non-tender; no palpable masses, no distension Ext:    No edema; adequate peripheral perfusion Skin:      Warm and dry; no rash Neuro: alert and oriented x 3 Psych: normal mood and affect  Data Reviewed:  Reviewed labs, radiology imaging, old records and pertinent past GI work up   Assessment and Plan/Recommendations: 51 year old female with history of chronic GERD and intermittent epigastric abdominal pain  GERD: Continue PPI daily and Zantac at bedtime as needed Discussed lifestyle modification and antireflux measures  Epigastric abdominal pain: Patient had extensive imaging and GI workup if no significant pathology Functional versus secondary to acid reflux Trial of IB Gard 1 capsule 3 times daily as needed  Colorectal cancer screening: Increased risk Removal of a tubular adenoma greater than 1 cm piecemeal August 2017 Due for recall colonoscopy in August 2018  Alternating constipation with diarrhea: IBS Benefiber 1 tablespoon 3 times daily with meals Increase dietary fluid intake  Return in 1 year or sooner if needed  Damaris Hippo , MD [phone] Mon-Fri 8a-5p [phone] after 5p, weekends, holidays  CC: Philmore Pali, NP

## 2017-05-07 ENCOUNTER — Encounter: Payer: Self-pay | Admitting: Gastroenterology

## 2017-08-04 ENCOUNTER — Telehealth: Payer: BLUE CROSS/BLUE SHIELD | Admitting: Family

## 2017-08-04 DIAGNOSIS — R059 Cough, unspecified: Secondary | ICD-10-CM

## 2017-08-04 DIAGNOSIS — R509 Fever, unspecified: Secondary | ICD-10-CM

## 2017-08-04 DIAGNOSIS — R05 Cough: Secondary | ICD-10-CM

## 2017-08-04 MED ORDER — BENZONATATE 100 MG PO CAPS: 100.0000 mg | ORAL_CAPSULE | Freq: Three times a day (TID) | ORAL | 0 refills | Status: DC | PRN

## 2017-08-04 MED ORDER — AZITHROMYCIN 250 MG PO TABS: ORAL_TABLET | ORAL | 0 refills | Status: DC

## 2017-08-04 NOTE — Progress Notes (Signed)
We are sorry that you are not feeling well.  Here is how we plan to help!  Based on your presentation I believe you most likely have A cough due to bacteria.  When patients have a fever and a productive cough with a change in color or increased sputum production, we are concerned about bacterial bronchitis.  If left untreated it can progress to pneumonia.  If your symptoms do not improve with your treatment plan it is important that you contact your provider.   I have prescribed Azithromyin 250 mg: two tables now and then one tablet daily for 4 additonal days    In addition you Pflum use A non-prescription cough medication called Robitussin DAC. Take 2 teaspoons every 8 hours or Delsym: take 2 teaspoons every 12 hours., A non-prescription cough medication called Mucinex DM: take 2 tablets every 12 hours. and A prescription cough medication called Tessalon Perles 100mg . You Akre take 1-2 capsules every 8 hours as needed for your cough.   From your responses in the eVisit questionnaire you describe inflammation in the upper respiratory tract which is causing a significant cough.  This is commonly called Bronchitis and has four common causes:    Allergies  Viral Infections  Acid Reflux  Bacterial Infection Allergies, viruses and acid reflux are treated by controlling symptoms or eliminating the cause. An example might be a cough caused by taking certain blood pressure medications. You stop the cough by changing the medication. Another example might be a cough caused by acid reflux. Controlling the reflux helps control the cough.  USE OF BRONCHODILATOR ("RESCUE") INHALERS: There is a risk from using your bronchodilator too frequently.  The risk is that over-reliance on a medication which only relaxes the muscles surrounding the breathing tubes can reduce the effectiveness of medications prescribed to reduce swelling and congestion of the tubes themselves.  Although you feel brief relief from the  bronchodilator inhaler, your asthma Papin actually be worsening with the tubes becoming more swollen and filled with mucus.  This can delay other crucial treatments, such as oral steroid medications. If you need to use a bronchodilator inhaler daily, several times per day, you should discuss this with your provider.  There are probably better treatments that could be used to keep your asthma under control.     HOME CARE . Only take medications as instructed by your medical team. . Complete the entire course of an antibiotic. . Drink plenty of fluids and get plenty of rest. . Avoid close contacts especially the very young and the elderly . Cover your mouth if you cough or cough into your sleeve. . Always remember to wash your hands . A steam or ultrasonic humidifier can help congestion.   GET HELP RIGHT AWAY IF: . You develop worsening fever. . You become short of breath . You cough up blood. . Your symptoms persist after you have completed your treatment plan MAKE SURE YOU   Understand these instructions.  Will watch your condition.  Will get help right away if you are not doing well or get worse.  Your e-visit answers were reviewed by a board certified advanced clinical practitioner to complete your personal care plan.  Depending on the condition, your plan could have included both over the counter or prescription medications. If there is a problem please reply  once you have received a response from your provider. Your safety is important to [place].  If you have drug allergies check your prescription carefully.  You can use MyChart to ask questions about today's visit, request a non-urgent call back, or ask for a work or school excuse for 24 hours related to this e-Visit. If it has been greater than 24 hours you will need to follow up with your provider, or enter a new e-Visit to address those concerns. You will get an e-mail in the next two days asking about your experience.  I hope that  your e-visit has been valuable and will speed your recovery. Thank you for using e-visits.   

## 2018-01-27 ENCOUNTER — Other Ambulatory Visit: Payer: Self-pay | Admitting: Gastroenterology

## 2018-02-04 DIAGNOSIS — E785 Hyperlipidemia, unspecified: Secondary | ICD-10-CM | POA: Diagnosis not present

## 2018-02-04 DIAGNOSIS — Z0000 Encounter for general adult medical examination without abnormal findings: Secondary | ICD-10-CM | POA: Diagnosis not present

## 2018-02-04 DIAGNOSIS — Z1231 Encounter for screening mammogram for malignant neoplasm of breast: Secondary | ICD-10-CM | POA: Diagnosis not present

## 2018-02-04 DIAGNOSIS — N952 Postmenopausal atrophic vaginitis: Secondary | ICD-10-CM | POA: Diagnosis not present

## 2018-02-20 DIAGNOSIS — Z6841 Body Mass Index (BMI) 40.0 and over, adult: Secondary | ICD-10-CM | POA: Diagnosis not present

## 2018-02-20 DIAGNOSIS — R1011 Right upper quadrant pain: Secondary | ICD-10-CM | POA: Diagnosis not present

## 2018-02-20 DIAGNOSIS — R112 Nausea with vomiting, unspecified: Secondary | ICD-10-CM | POA: Diagnosis not present

## 2018-02-20 DIAGNOSIS — D72829 Elevated white blood cell count, unspecified: Secondary | ICD-10-CM | POA: Diagnosis not present

## 2018-02-20 DIAGNOSIS — I10 Essential (primary) hypertension: Secondary | ICD-10-CM | POA: Diagnosis not present

## 2018-02-20 DIAGNOSIS — Z87891 Personal history of nicotine dependence: Secondary | ICD-10-CM | POA: Diagnosis not present

## 2018-02-20 DIAGNOSIS — K828 Other specified diseases of gallbladder: Secondary | ICD-10-CM | POA: Diagnosis not present

## 2018-02-22 DIAGNOSIS — Z1231 Encounter for screening mammogram for malignant neoplasm of breast: Secondary | ICD-10-CM | POA: Diagnosis not present

## 2018-03-21 DIAGNOSIS — K828 Other specified diseases of gallbladder: Secondary | ICD-10-CM | POA: Diagnosis not present

## 2018-03-21 DIAGNOSIS — R110 Nausea: Secondary | ICD-10-CM | POA: Diagnosis not present

## 2018-03-21 DIAGNOSIS — R1010 Upper abdominal pain, unspecified: Secondary | ICD-10-CM | POA: Diagnosis not present

## 2018-03-21 DIAGNOSIS — K219 Gastro-esophageal reflux disease without esophagitis: Secondary | ICD-10-CM | POA: Diagnosis not present

## 2018-03-29 DIAGNOSIS — E669 Obesity, unspecified: Secondary | ICD-10-CM | POA: Diagnosis not present

## 2018-03-29 DIAGNOSIS — K219 Gastro-esophageal reflux disease without esophagitis: Secondary | ICD-10-CM | POA: Diagnosis not present

## 2018-03-29 DIAGNOSIS — K229 Disease of esophagus, unspecified: Secondary | ICD-10-CM | POA: Diagnosis not present

## 2018-03-29 DIAGNOSIS — Z79899 Other long term (current) drug therapy: Secondary | ICD-10-CM | POA: Diagnosis not present

## 2018-03-29 DIAGNOSIS — R109 Unspecified abdominal pain: Secondary | ICD-10-CM | POA: Diagnosis not present

## 2018-03-29 DIAGNOSIS — K2960 Other gastritis without bleeding: Secondary | ICD-10-CM | POA: Diagnosis not present

## 2018-03-29 DIAGNOSIS — I10 Essential (primary) hypertension: Secondary | ICD-10-CM | POA: Diagnosis not present

## 2018-03-29 DIAGNOSIS — K317 Polyp of stomach and duodenum: Secondary | ICD-10-CM | POA: Diagnosis not present

## 2018-03-29 DIAGNOSIS — Z87891 Personal history of nicotine dependence: Secondary | ICD-10-CM | POA: Diagnosis not present

## 2018-03-29 DIAGNOSIS — K2970 Gastritis, unspecified, without bleeding: Secondary | ICD-10-CM | POA: Diagnosis not present

## 2018-03-29 DIAGNOSIS — E785 Hyperlipidemia, unspecified: Secondary | ICD-10-CM | POA: Diagnosis not present

## 2018-05-09 ENCOUNTER — Other Ambulatory Visit: Payer: Self-pay | Admitting: Gastroenterology

## 2018-05-16 DIAGNOSIS — K2970 Gastritis, unspecified, without bleeding: Secondary | ICD-10-CM | POA: Diagnosis not present

## 2018-05-16 DIAGNOSIS — K635 Polyp of colon: Secondary | ICD-10-CM | POA: Diagnosis not present

## 2018-05-16 DIAGNOSIS — K210 Gastro-esophageal reflux disease with esophagitis: Secondary | ICD-10-CM | POA: Diagnosis not present

## 2018-05-16 DIAGNOSIS — R1010 Upper abdominal pain, unspecified: Secondary | ICD-10-CM | POA: Diagnosis not present

## 2018-05-25 DIAGNOSIS — Z139 Encounter for screening, unspecified: Secondary | ICD-10-CM | POA: Diagnosis not present

## 2018-05-25 DIAGNOSIS — R1010 Upper abdominal pain, unspecified: Secondary | ICD-10-CM | POA: Diagnosis not present

## 2018-05-25 DIAGNOSIS — I700 Atherosclerosis of aorta: Secondary | ICD-10-CM | POA: Diagnosis not present

## 2018-05-25 DIAGNOSIS — R109 Unspecified abdominal pain: Secondary | ICD-10-CM | POA: Diagnosis not present

## 2018-07-11 DIAGNOSIS — R1010 Upper abdominal pain, unspecified: Secondary | ICD-10-CM | POA: Diagnosis not present

## 2018-07-11 DIAGNOSIS — K635 Polyp of colon: Secondary | ICD-10-CM | POA: Diagnosis not present

## 2018-07-11 DIAGNOSIS — R110 Nausea: Secondary | ICD-10-CM | POA: Diagnosis not present

## 2018-07-11 DIAGNOSIS — Z6841 Body Mass Index (BMI) 40.0 and over, adult: Secondary | ICD-10-CM | POA: Diagnosis not present

## 2018-08-03 DIAGNOSIS — Z86010 Personal history of colonic polyps: Secondary | ICD-10-CM | POA: Diagnosis not present

## 2018-08-03 DIAGNOSIS — K648 Other hemorrhoids: Secondary | ICD-10-CM | POA: Diagnosis not present

## 2018-08-03 DIAGNOSIS — D127 Benign neoplasm of rectosigmoid junction: Secondary | ICD-10-CM | POA: Diagnosis not present

## 2018-08-03 DIAGNOSIS — K635 Polyp of colon: Secondary | ICD-10-CM | POA: Diagnosis not present

## 2018-08-03 DIAGNOSIS — I10 Essential (primary) hypertension: Secondary | ICD-10-CM | POA: Diagnosis not present

## 2018-08-03 DIAGNOSIS — R110 Nausea: Secondary | ICD-10-CM | POA: Diagnosis not present

## 2018-08-03 DIAGNOSIS — Z1211 Encounter for screening for malignant neoplasm of colon: Secondary | ICD-10-CM | POA: Diagnosis not present

## 2018-08-03 DIAGNOSIS — E785 Hyperlipidemia, unspecified: Secondary | ICD-10-CM | POA: Diagnosis not present

## 2018-08-03 DIAGNOSIS — D128 Benign neoplasm of rectum: Secondary | ICD-10-CM | POA: Diagnosis not present

## 2018-10-31 DIAGNOSIS — R350 Frequency of micturition: Secondary | ICD-10-CM | POA: Diagnosis not present

## 2018-10-31 DIAGNOSIS — Z6841 Body Mass Index (BMI) 40.0 and over, adult: Secondary | ICD-10-CM | POA: Diagnosis not present

## 2018-10-31 DIAGNOSIS — E119 Type 2 diabetes mellitus without complications: Secondary | ICD-10-CM | POA: Diagnosis not present

## 2018-10-31 DIAGNOSIS — J069 Acute upper respiratory infection, unspecified: Secondary | ICD-10-CM | POA: Diagnosis not present

## 2018-12-27 DIAGNOSIS — E785 Hyperlipidemia, unspecified: Secondary | ICD-10-CM | POA: Diagnosis not present

## 2018-12-27 DIAGNOSIS — I150 Renovascular hypertension: Secondary | ICD-10-CM | POA: Diagnosis not present

## 2018-12-27 DIAGNOSIS — Z1331 Encounter for screening for depression: Secondary | ICD-10-CM | POA: Diagnosis not present

## 2018-12-27 DIAGNOSIS — R1011 Right upper quadrant pain: Secondary | ICD-10-CM | POA: Diagnosis not present

## 2018-12-27 DIAGNOSIS — E119 Type 2 diabetes mellitus without complications: Secondary | ICD-10-CM | POA: Diagnosis not present

## 2019-01-09 DIAGNOSIS — Z713 Dietary counseling and surveillance: Secondary | ICD-10-CM | POA: Diagnosis not present

## 2019-02-11 DIAGNOSIS — J019 Acute sinusitis, unspecified: Secondary | ICD-10-CM | POA: Diagnosis not present

## 2019-02-11 DIAGNOSIS — R0981 Nasal congestion: Secondary | ICD-10-CM | POA: Diagnosis not present

## 2019-04-13 DIAGNOSIS — I150 Renovascular hypertension: Secondary | ICD-10-CM | POA: Diagnosis not present

## 2019-04-13 DIAGNOSIS — E119 Type 2 diabetes mellitus without complications: Secondary | ICD-10-CM | POA: Diagnosis not present

## 2019-04-13 DIAGNOSIS — E785 Hyperlipidemia, unspecified: Secondary | ICD-10-CM | POA: Diagnosis not present

## 2019-04-17 DIAGNOSIS — K219 Gastro-esophageal reflux disease without esophagitis: Secondary | ICD-10-CM | POA: Diagnosis not present

## 2019-04-17 DIAGNOSIS — E119 Type 2 diabetes mellitus without complications: Secondary | ICD-10-CM | POA: Diagnosis not present

## 2019-04-17 DIAGNOSIS — E785 Hyperlipidemia, unspecified: Secondary | ICD-10-CM | POA: Diagnosis not present

## 2019-04-17 DIAGNOSIS — I150 Renovascular hypertension: Secondary | ICD-10-CM | POA: Diagnosis not present

## 2019-09-25 DIAGNOSIS — E119 Type 2 diabetes mellitus without complications: Secondary | ICD-10-CM | POA: Diagnosis not present

## 2019-09-25 DIAGNOSIS — E785 Hyperlipidemia, unspecified: Secondary | ICD-10-CM | POA: Diagnosis not present

## 2019-09-25 DIAGNOSIS — I150 Renovascular hypertension: Secondary | ICD-10-CM | POA: Diagnosis not present

## 2019-09-28 DIAGNOSIS — E785 Hyperlipidemia, unspecified: Secondary | ICD-10-CM | POA: Diagnosis not present

## 2019-09-28 DIAGNOSIS — I150 Renovascular hypertension: Secondary | ICD-10-CM | POA: Diagnosis not present

## 2019-09-28 DIAGNOSIS — I701 Atherosclerosis of renal artery: Secondary | ICD-10-CM | POA: Diagnosis not present

## 2019-09-28 DIAGNOSIS — E119 Type 2 diabetes mellitus without complications: Secondary | ICD-10-CM | POA: Diagnosis not present

## 2019-12-05 DIAGNOSIS — Z1231 Encounter for screening mammogram for malignant neoplasm of breast: Secondary | ICD-10-CM | POA: Diagnosis not present

## 2020-01-04 DIAGNOSIS — R928 Other abnormal and inconclusive findings on diagnostic imaging of breast: Secondary | ICD-10-CM | POA: Diagnosis not present

## 2020-05-16 DIAGNOSIS — E119 Type 2 diabetes mellitus without complications: Secondary | ICD-10-CM | POA: Diagnosis not present

## 2020-05-16 DIAGNOSIS — I701 Atherosclerosis of renal artery: Secondary | ICD-10-CM | POA: Diagnosis not present

## 2020-05-16 DIAGNOSIS — E785 Hyperlipidemia, unspecified: Secondary | ICD-10-CM | POA: Diagnosis not present

## 2020-05-16 DIAGNOSIS — Z1331 Encounter for screening for depression: Secondary | ICD-10-CM | POA: Diagnosis not present

## 2020-05-16 DIAGNOSIS — I150 Renovascular hypertension: Secondary | ICD-10-CM | POA: Diagnosis not present

## 2020-11-19 ENCOUNTER — Other Ambulatory Visit (HOSPITAL_COMMUNITY): Payer: Self-pay | Admitting: Nurse Practitioner

## 2020-11-19 DIAGNOSIS — R079 Chest pain, unspecified: Secondary | ICD-10-CM | POA: Diagnosis not present

## 2020-11-19 DIAGNOSIS — E1169 Type 2 diabetes mellitus with other specified complication: Secondary | ICD-10-CM | POA: Diagnosis not present

## 2020-11-19 DIAGNOSIS — K219 Gastro-esophageal reflux disease without esophagitis: Secondary | ICD-10-CM | POA: Diagnosis not present

## 2020-11-19 DIAGNOSIS — I701 Atherosclerosis of renal artery: Secondary | ICD-10-CM | POA: Diagnosis not present

## 2020-11-19 DIAGNOSIS — Z1331 Encounter for screening for depression: Secondary | ICD-10-CM | POA: Diagnosis not present

## 2020-11-19 DIAGNOSIS — I700 Atherosclerosis of aorta: Secondary | ICD-10-CM | POA: Diagnosis not present

## 2020-11-19 DIAGNOSIS — I150 Renovascular hypertension: Secondary | ICD-10-CM | POA: Diagnosis not present

## 2020-11-19 DIAGNOSIS — Z6841 Body Mass Index (BMI) 40.0 and over, adult: Secondary | ICD-10-CM | POA: Diagnosis not present

## 2020-11-19 DIAGNOSIS — E785 Hyperlipidemia, unspecified: Secondary | ICD-10-CM | POA: Diagnosis not present

## 2020-11-20 ENCOUNTER — Encounter: Payer: Self-pay | Admitting: *Deleted

## 2020-12-10 ENCOUNTER — Encounter: Payer: Self-pay | Admitting: Cardiology

## 2020-12-10 ENCOUNTER — Ambulatory Visit: Payer: 59 | Admitting: Cardiology

## 2020-12-10 ENCOUNTER — Other Ambulatory Visit: Payer: Self-pay

## 2020-12-10 VITALS — BP 132/80 | HR 67 | Ht 63.5 in | Wt 249.0 lb

## 2020-12-10 DIAGNOSIS — R072 Precordial pain: Secondary | ICD-10-CM | POA: Diagnosis not present

## 2020-12-10 DIAGNOSIS — I10 Essential (primary) hypertension: Secondary | ICD-10-CM

## 2020-12-10 DIAGNOSIS — E7800 Pure hypercholesterolemia, unspecified: Secondary | ICD-10-CM | POA: Diagnosis not present

## 2020-12-10 MED ORDER — METOPROLOL TARTRATE 100 MG PO TABS: 100.0000 mg | ORAL_TABLET | Freq: Once | ORAL | 0 refills | Status: DC

## 2020-12-10 NOTE — Patient Instructions (Signed)
Medication Instructions:   Your physician recommends that you continue on your current medications as directed. Please refer to the Current Medication list given to you today.  *If you need a refill on your cardiac medications before your next appointment, please call your pharmacy*   Lab Work:  BMP to be drawn today.   Testing/Procedures:  1.  Your physician has requested that you have an echocardiogram. Echocardiography is a painless test that uses sound waves to create images of your heart. It provides your doctor with information about the size and shape of your heart and how well your heart's chambers and valves are working. This procedure takes approximately one hour. There are no restrictions for this procedure.  2.  Your physician has requested that you have cardiac CT. Cardiac computed tomography (CT) is a painless test that uses an x-ray machine to take clear, detailed pictures of your heart.   Your cardiac CT will be scheduled at :   [facility] [address] [place], [place] [address] [phone]  Please arrive 15 mins early for check-in and test prep.    Please follow these instructions carefully (unless otherwise directed):    On the Night Before the Test: . Be sure to Drink plenty of water. . Do not consume any caffeinated/decaffeinated beverages or chocolate 12 hours prior to your test. . Do not take any antihistamines 12 hours prior to your test.   On the Day of the Test: . Drink plenty of water. Do not drink any water within one hour of the test. . Do not eat any food 4 hours prior to the test. . You Stanbery take your regular medications prior to the test.  . Take metoprolol (Lopressor) two hours prior to test. ( This was sent to [facility]) . HOLD furosemide (LASIX)  . FEMALES- please wear underwire-free bra if available    After the Test: . Drink plenty of water. . After receiving IV  contrast, you Formby experience a mild flushed feeling. This is normal. . On occasion, you Hill experience a mild rash up to 24 hours after the test. This is not dangerous. If this occurs, you can take Benadryl 25 mg and increase your fluid intake. . If you experience trouble breathing, this can be serious. If it is severe call 911 IMMEDIATELY. If it is mild, please call our office. . If you take any of these medications: Glipizide/Metformin, Avandament, Glucavance, please do not take 48 hours after completing test unless otherwise instructed.   Once we have confirmed authorization from your insurance company, we will call you to set up a date and time for your test. Based on how quickly your insurance processes prior authorizations requests, please allow up to 4 weeks to be contacted for scheduling your Cardiac CT appointment. Be advised that routine Cardiac CT appointments could be scheduled as many as 8 weeks after your provider has ordered it.  For non-scheduling related questions, please contact the cardiac imaging nurse navigator should you have any questions/concerns: Marchia Bond, Cardiac Imaging Nurse Navigator Burley Saver, Interim Cardiac Imaging Nurse [place] [facility] V[facility] Services Direct Office Dial: [phone]   For scheduling needs, including cancellations and rescheduling, please call [place], [phone].        Follow-Up: At [facility], you and your health needs are our priority.  As part of our continuing mission to provide you with exceptional heart care, we have created designated Provider Care Teams.  These Care Teams include your primary Cardiologist (physician) and Advanced Practice Providers (APPs -  Physician Assistants and Nurse Practitioners) who all work together to provide you with the care you need, when you need it.  We recommend signing up for the patient portal called "MyChart".  Sign up information is provided on this After Visit Summary.   MyChart is used to connect with patients for Virtual Visits (Telemedicine).  Patients are able to view lab/test results, encounter notes, upcoming appointments, etc.  Non-urgent messages can be sent to your provider as well.   To learn more about what you can do with MyChart, go to [email or web address].    Your next appointment:   6 week(s)  The format for your next appointment:   In Person  Provider:   Kate Sable, MD   Other Instructions

## 2020-12-10 NOTE — Progress Notes (Signed)
Cardiology Office Note:    Date:  12/10/2020   ID:  Michelle Ray, DOB 05/10/1966, MRN [id number]  PCP:  Philmore Pali, NP  [facility] Cardiologist:  Kate Sable, MD  [place] Electrophysiologist:  None   Referring MD: Philmore Pali, NP   Chief Complaint  Patient presents with  . New Patient (Initial Visit)    Establish care with provider for chest pains and hypertension. Medications verbally reviewed with patient.    Michelle Ray is a 55 y.o. female who is being seen today for the evaluation of chest pain at the request of Lam, Rudi Rummage, NP.   History of Present Illness:    Michelle Ray is a 55 y.o. female with a hx of hypertension, hyperlipidemia, former smoker x30 years who presents due to chest pain.  Patient states having symptoms of chest pain over the past 6 months which is worsened.  Symptoms are typically associated with exertion.  States having central chest tightness which she rates as 5 out of 10, occasionally associated with shortness of breath.  Denies edema.  Has a history of early MI in her father at age 61.  Denies palpitations.  Past Medical History:  Diagnosis Date  . Allergy   . Anemia   . Aortic atherosclerosis ([place])   . Benign secondary hypertension due to renal artery stenosis ([facility])   . Constipation    uses OTC stool softener prn only- this is not chronic  . GERD (gastroesophageal reflux disease)   . Hyperlipemia   . Hyperlipidemia   . Hypertension     Past Surgical History:  Procedure Laterality Date  . ABLATION    . TUBAL LIGATION    . uterine fibroid removal    . WISDOM TOOTH EXTRACTION      Current Medications: Current Meds  Medication Sig  . amLODipine (NORVASC) 10 MG tablet Take 10 mg by mouth daily.  . benazepril (LOTENSIN) 20 MG tablet Take 20 mg by mouth daily.  . Cyanocobalamin (VITAMIN B-12 PO) Take 1 tablet by mouth daily.  . furosemide (LASIX) 40 MG tablet Take 40 mg by mouth daily as needed.   . metoprolol tartrate  (LOPRESSOR) 100 MG tablet Take 1 tablet (100 mg total) by mouth once for 1 dose. Take 2 hours prior to your CT scan.  . omeprazole (PRILOSEC) 40 MG capsule Take 40 mg by mouth daily.  . ondansetron (ZOFRAN) 4 MG tablet Take 1 tablet (4 mg total) by mouth every 6 (six) hours as needed for nausea.  . rosuvastatin (CRESTOR) 10 MG tablet Take 10 mg by mouth daily.  . Semaglutide (OZEMPIC, 0.25 OR 0.5 MG/DOSE, ) Inject 0.5 mg into the skin every 14 (fourteen) days.  Marland Kitchen triamterene-hydrochlorothiazide (DYAZIDE) 37.5-25 MG capsule Take 1 capsule by mouth daily.   Current Facility-Administered Medications for the 12/10/20 encounter (Office Visit) with Kate Sable, MD  Medication  . 0.9 %  sodium chloride infusion  . 0.9 %  sodium chloride infusion     Allergies:   Macrobid [nitrofurantoin] and Pravastatin   Social History   Socioeconomic History  . Marital status: Married    Spouse name: Not on file  . Number of children: Not on file  . Years of education: Not on file  . Highest education level: Not on file  Occupational History  . Not on file  Tobacco Use  . Smoking status: Former Research scientist (life sciences)  . Smokeless tobacco: Never Used  Substance and Sexual Activity  . Alcohol use:  No  . Drug use: No  . Sexual activity: Not on file  Other Topics Concern  . Not on file  Social History Narrative  . Not on file   Social Determinants of Health   Financial Resource Strain: Not on file  Food Insecurity: Not on file  Transportation Needs: Not on file  Physical Activity: Not on file  Stress: Not on file  Social Connections: Not on file     Family History: The patient's family history includes Atrial fibrillation in her mother; Cancer in her mother; Heart disease in her maternal grandfather; Heart failure in her sister; Hypertension in her maternal grandmother; Pulmonary embolism in her mother; Stroke in her paternal grandfather. There is no history of Colon cancer or Colon polyps.  ROS:    Please see the history of present illness.     All other systems reviewed and are negative.  EKGs/Labs/Other Studies Reviewed:    The following studies were reviewed today:   EKG:  EKG is  ordered today.  The ekg ordered today demonstrates normal sinus rhythm.  Recent Labs: No results found for requested labs within last 8760 hours.  Recent Lipid Panel No results found for: CHOL, TRIG, HDL, CHOLHDL, VLDL, LDLCALC, LDLDIRECT   Risk Assessment/Calculations:     Physical Exam:    VS:  BP 132/80 (BP Location: Right Arm, Patient Position: Sitting, Cuff Size: Large)   Pulse 67   Ht 5' 3.5" (1.613 m)   Wt 249 lb (112.9 kg)   BMI 43.42 kg/m     Wt Readings from Last 3 Encounters:  12/10/20 249 lb (112.9 kg)  04/30/17 251 lb 9.6 oz (114.1 kg)  10/30/16 242 lb (109.8 kg)     GEN:  Well nourished, well developed in no acute distress HEENT: Normal NECK: No JVD; No carotid bruits LYMPHATICS: No lymphadenopathy CARDIAC: RRR, no murmurs, rubs, gallops RESPIRATORY:  Clear to auscultation without rales, wheezing or rhonchi  ABDOMEN: Soft, non-tender, non-distended MUSCULOSKELETAL:  No edema; No deformity  SKIN: Warm and dry NEUROLOGIC:  Alert and oriented x 3 PSYCHIATRIC:  Normal affect   ASSESSMENT:    1. Precordial pain   2. Primary hypertension   3. Pure hypercholesterolemia    PLAN:    In order of problems listed above:  1. Chest pain consistent with angina .  Risk factors of hypertension, hyperlipidemia, family history of early CAD, former smoker.  Get echocardiogram, coronary CTA to evaluate for presence of CAD. 2. Hypertension, BP controlled.  Continue current BP meds. 3. Hyperlipidemia, continue Crestor as prescribed.  Follow-up after echo and coronary CTA.     Medication Adjustments/Labs and Tests Ordered: Current medicines are reviewed at length with the patient today.  Concerns regarding medicines are outlined above.  Orders Placed This Encounter   Procedures  . CT CORONARY MORPH W/CTA COR W/SCORE W/CA W/CM &/OR WO/CM  . CT CORONARY FRACTIONAL FLOW RESERVE DATA PREP  . CT CORONARY FRACTIONAL FLOW RESERVE FLUID ANALYSIS  . Basic metabolic panel  . ECHOCARDIOGRAM COMPLETE   Meds ordered this encounter  Medications  . metoprolol tartrate (LOPRESSOR) 100 MG tablet    Sig: Take 1 tablet (100 mg total) by mouth once for 1 dose. Take 2 hours prior to your CT scan.    Dispense:  1 tablet    Refill:  0    Patient Instructions  Medication Instructions:   Your physician recommends that you continue on your current medications as directed. Please refer to the Current Medication  list given to you today.  *If you need a refill on your cardiac medications before your next appointment, please call your pharmacy*   Lab Work:  BMP to be drawn today.   Testing/Procedures:  1.  Your physician has requested that you have an echocardiogram. Echocardiography is a painless test that uses sound waves to create images of your heart. It provides your doctor with information about the size and shape of your heart and how well your heart's chambers and valves are working. This procedure takes approximately one hour. There are no restrictions for this procedure.  2.  Your physician has requested that you have cardiac CT. Cardiac computed tomography (CT) is a painless test that uses an x-ray machine to take clear, detailed pictures of your heart.   Your cardiac CT will be scheduled at :   [facility] [address] [place], [place] [address] [phone]  Please arrive 15 mins early for check-in and test prep.    Please follow these instructions carefully (unless otherwise directed):    On the Night Before the Test: . Be sure to Drink plenty of water. . Do not consume any caffeinated/decaffeinated beverages or chocolate 12 hours prior to your test. . Do not take any antihistamines 12 hours  prior to your test.   On the Day of the Test: . Drink plenty of water. Do not drink any water within one hour of the test. . Do not eat any food 4 hours prior to the test. . You Ickes take your regular medications prior to the test.  . Take metoprolol (Lopressor) two hours prior to test. ( This was sent to [facility]) . HOLD furosemide (LASIX)  . FEMALES- please wear underwire-free bra if available    After the Test: . Drink plenty of water. . After receiving IV contrast, you [place] experience a mild flushed feeling. This is normal. . On occasion, you [place] experience a mild rash up to 24 hours after the test. This is not dangerous. If this occurs, you can take Benadryl 25 mg and increase your fluid intake. . If you experience trouble breathing, this can be serious. If it is severe call 911 IMMEDIATELY. If it is mild, please call our office. . If you take any of these medications: Glipizide/Metformin, Avandament, Glucavance, please do not take 48 hours after completing test unless otherwise instructed.   Once we have confirmed authorization from your insurance company, we will call you to set up a date and time for your test. Based on how quickly your insurance processes prior authorizations requests, please allow up to 4 weeks to be contacted for scheduling your Cardiac CT appointment. Be advised that routine Cardiac CT appointments could be scheduled as many as 8 weeks after your provider has ordered it.  For non-scheduling related questions, please contact the cardiac imaging nurse navigator should you have any questions/concerns: Marchia Bond, Cardiac Imaging Nurse Navigator Burley Saver, Interim Cardiac Imaging Nurse [place] and Vascular Services Direct Office Dial: [phone]   For scheduling needs, including cancellations and rescheduling, please call [place], [phone].        Follow-Up: At [facility], you and your health needs are our  priority.  As part of our continuing mission to provide you with exceptional heart care, we have created designated Provider Care Teams.  These Care Teams include your primary Cardiologist (physician) and Advanced Practice Providers (APPs -  Physician Assistants and Nurse Practitioners) who  all work together to provide you with the care you need, when you need it.  We recommend signing up for the patient portal called "MyChart".  Sign up information is provided on this After Visit Summary.  MyChart is used to connect with patients for Virtual Visits (Telemedicine).  Patients are able to view lab/test results, encounter notes, upcoming appointments, etc.  Non-urgent messages can be sent to your provider as well.   To learn more about what you can do with MyChart, go to [email or web address].    Your next appointment:   6 week(s)  The format for your next appointment:   In Person  Provider:   Kate Sable, MD   Other Instructions       Signed, Kate Sable, MD  12/10/2020 10:03 AM    [place]

## 2020-12-11 LAB — BASIC METABOLIC PANEL
BUN/Creatinine Ratio: 21 (ref 9–23)
BUN: 21 mg/dL (ref 6–24)
CO2: 24 mmol/L (ref 20–29)
Calcium: 9.7 mg/dL (ref 8.7–10.2)
Chloride: 98 mmol/L (ref 96–106)
Creatinine, Ser: 1.01 mg/dL — ABNORMAL HIGH (ref 0.57–1.00)
GFR calc Af Amer: 72 mL/min/{1.73_m2} (ref >59)
GFR calc non Af Amer: 62 mL/min/{1.73_m2} (ref >59)
Glucose: 94 mg/dL (ref 65–99)
Potassium: 3.2 mmol/L — ABNORMAL LOW (ref 3.5–5.2)
Sodium: 138 mmol/L (ref 134–144)

## 2020-12-11 NOTE — Addendum Note (Signed)
Addended by: Ronaldo Miyamoto on: 12/11/2020 07:47 AM   Modules accepted: Orders

## 2020-12-12 ENCOUNTER — Other Ambulatory Visit: Payer: Self-pay

## 2020-12-12 ENCOUNTER — Ambulatory Visit (INDEPENDENT_AMBULATORY_CARE_PROVIDER_SITE_OTHER): Payer: 59

## 2020-12-12 DIAGNOSIS — R072 Precordial pain: Secondary | ICD-10-CM | POA: Diagnosis not present

## 2020-12-12 LAB — ECHOCARDIOGRAM COMPLETE
AR max vel: 2.2 cm2
AV Area VTI: 2.28 cm2
AV Area mean vel: 2.17 cm2
AV Mean grad: 7 mmHg
AV Peak grad: 15.1 mmHg
Ao pk vel: 1.94 m/s
Area-P 1/2: 2.95 cm2
S' Lateral: 3.2 cm

## 2020-12-12 MED ORDER — PERFLUTREN LIPID MICROSPHERE
1.0000 mL | INTRAVENOUS | Status: AC | PRN
  Administered 2020-12-12: 2 mL via INTRAVENOUS

## 2020-12-15 ENCOUNTER — Other Ambulatory Visit (HOSPITAL_COMMUNITY): Payer: Self-pay | Admitting: Nurse Practitioner

## 2020-12-24 ENCOUNTER — Telehealth (HOSPITAL_COMMUNITY): Payer: Self-pay | Admitting: *Deleted

## 2020-12-24 NOTE — Telephone Encounter (Signed)
Attempted to call patient regarding upcoming cardiac CT appointment. Left message on voicemail with name and callback number  Darrah Dredge RN Navigator Cardiac Imaging  Heart and Vascular Services [phone] Office [phone] Cell  

## 2020-12-26 ENCOUNTER — Ambulatory Visit
Admission: RE | Admit: 2020-12-26 | Discharge: 2020-12-26 | Disposition: A | Discharge status: Home / Self Care | Payer: 59 | Source: Ambulatory Visit | Attending: Cardiology | Admitting: Cardiology

## 2020-12-26 ENCOUNTER — Other Ambulatory Visit: Payer: Self-pay

## 2020-12-26 DIAGNOSIS — R072 Precordial pain: Secondary | ICD-10-CM | POA: Insufficient documentation

## 2020-12-26 HISTORY — DX: Type 2 diabetes mellitus without complications: E11.9

## 2020-12-26 MED ORDER — IOHEXOL 350 MG/ML SOLN
110.0000 mL | Freq: Once | INTRAVENOUS | Status: AC | PRN
  Administered 2020-12-26: 110 mL via INTRAVENOUS

## 2020-12-26 MED ORDER — NITROGLYCERIN 0.4 MG SL SUBL
0.8000 mg | SUBLINGUAL_TABLET | Freq: Once | SUBLINGUAL | Status: AC
  Administered 2020-12-26: 0.8 mg via SUBLINGUAL

## 2020-12-26 MED ORDER — METOPROLOL TARTRATE 5 MG/5ML IV SOLN
10.0000 mg | Freq: Once | INTRAVENOUS | Status: AC
  Administered 2020-12-26: 10 mg via INTRAVENOUS

## 2020-12-26 NOTE — Progress Notes (Signed)
Patient tolerated procedure well. Ambulate w/o difficulty. Sitting in chair drinking water provided. Encouraged to drink extra water today and reasoning explained. Verbalized understanding. All questions answered. ABC intact. No further needs. Discharge from procedure area w/o issues.  

## 2020-12-31 ENCOUNTER — Other Ambulatory Visit: Payer: Self-pay | Admitting: Nurse Practitioner

## 2020-12-31 DIAGNOSIS — Z1231 Encounter for screening mammogram for malignant neoplasm of breast: Secondary | ICD-10-CM

## 2021-01-01 MED FILL — ESOMEPRAZOLE MAG DR 40 MG C: 40 | 90 days supply | Qty: 90 | Fill #0

## 2021-01-01 MED FILL — TRIAMTERENE-HCTZ 37.5-25 MG: 37.5-25 | 90 days supply | Qty: 90 | Fill #0

## 2021-01-01 MED FILL — ONDANSETRON HCL 4 MG TABS: 4 | 7 days supply | Qty: 30 | Fill #0

## 2021-01-01 MED FILL — OZEMPIC (1 MG/DOSE) 4 MG/3M: 4 | 28 days supply | Qty: 3 | Fill #0

## 2021-01-01 MED FILL — BENAZEPRIL HCL 20 MG TABLET: 20 | 90 days supply | Qty: 90 | Fill #0

## 2021-01-01 MED FILL — AMLODIPINE BESYLATE 10 MG T: 10 | 90 days supply | Qty: 90 | Fill #0

## 2021-01-01 MED FILL — ROSUVASTATIN CALCIUM 10 MG: 10 | 90 days supply | Qty: 90 | Fill #0

## 2021-01-01 MED FILL — FUROSEMIDE 40 MG TAB: 40 | 90 days supply | Qty: 90 | Fill #0

## 2021-01-24 ENCOUNTER — Ambulatory Visit: Payer: 59 | Admitting: Cardiology

## 2021-01-28 DIAGNOSIS — Z1231 Encounter for screening mammogram for malignant neoplasm of breast: Secondary | ICD-10-CM

## 2021-02-17 ENCOUNTER — Other Ambulatory Visit: Payer: Self-pay

## 2021-02-17 ENCOUNTER — Ambulatory Visit (HOSPITAL_BASED_OUTPATIENT_CLINIC_OR_DEPARTMENT_OTHER): Payer: 59 | Admitting: Nurse Practitioner

## 2021-02-17 ENCOUNTER — Encounter (HOSPITAL_BASED_OUTPATIENT_CLINIC_OR_DEPARTMENT_OTHER): Payer: Self-pay | Admitting: Nurse Practitioner

## 2021-02-17 ENCOUNTER — Other Ambulatory Visit (HOSPITAL_BASED_OUTPATIENT_CLINIC_OR_DEPARTMENT_OTHER)
Admission: RE | Admit: 2021-02-17 | Discharge: 2021-02-17 | Disposition: A | Discharge status: Home / Self Care | Payer: 59 | Source: Ambulatory Visit | Attending: Nurse Practitioner | Admitting: Nurse Practitioner

## 2021-02-17 ENCOUNTER — Ambulatory Visit (HOSPITAL_BASED_OUTPATIENT_CLINIC_OR_DEPARTMENT_OTHER)
Admission: RE | Admit: 2021-02-17 | Discharge: 2021-02-17 | Disposition: A | Discharge status: Home / Self Care | Payer: 59 | Source: Ambulatory Visit | Attending: Nurse Practitioner | Admitting: Nurse Practitioner

## 2021-02-17 ENCOUNTER — Other Ambulatory Visit (HOSPITAL_BASED_OUTPATIENT_CLINIC_OR_DEPARTMENT_OTHER): Payer: Self-pay | Admitting: Nurse Practitioner

## 2021-02-17 VITALS — BP 113/66 | HR 74 | Ht 64.0 in | Wt 244.2 lb

## 2021-02-17 DIAGNOSIS — M25571 Pain in right ankle and joints of right foot: Secondary | ICD-10-CM | POA: Diagnosis not present

## 2021-02-17 DIAGNOSIS — R1011 Right upper quadrant pain: Secondary | ICD-10-CM | POA: Diagnosis not present

## 2021-02-17 DIAGNOSIS — I83893 Varicose veins of bilateral lower extremities with other complications: Secondary | ICD-10-CM

## 2021-02-17 DIAGNOSIS — K591 Functional diarrhea: Secondary | ICD-10-CM

## 2021-02-17 DIAGNOSIS — N393 Stress incontinence (female) (male): Secondary | ICD-10-CM

## 2021-02-17 DIAGNOSIS — M25471 Effusion, right ankle: Secondary | ICD-10-CM | POA: Diagnosis not present

## 2021-02-17 DIAGNOSIS — E119 Type 2 diabetes mellitus without complications: Secondary | ICD-10-CM | POA: Insufficient documentation

## 2021-02-17 DIAGNOSIS — E782 Mixed hyperlipidemia: Secondary | ICD-10-CM | POA: Insufficient documentation

## 2021-02-17 DIAGNOSIS — Z0000 Encounter for general adult medical examination without abnormal findings: Secondary | ICD-10-CM

## 2021-02-17 DIAGNOSIS — E1169 Type 2 diabetes mellitus with other specified complication: Secondary | ICD-10-CM | POA: Insufficient documentation

## 2021-02-17 DIAGNOSIS — M7731 Calcaneal spur, right foot: Secondary | ICD-10-CM | POA: Insufficient documentation

## 2021-02-17 DIAGNOSIS — I10 Essential (primary) hypertension: Secondary | ICD-10-CM

## 2021-02-17 DIAGNOSIS — G8929 Other chronic pain: Secondary | ICD-10-CM | POA: Insufficient documentation

## 2021-02-17 DIAGNOSIS — M7989 Other specified soft tissue disorders: Secondary | ICD-10-CM | POA: Diagnosis not present

## 2021-02-17 DIAGNOSIS — E118 Type 2 diabetes mellitus with unspecified complications: Secondary | ICD-10-CM | POA: Diagnosis not present

## 2021-02-17 DIAGNOSIS — M79644 Pain in right finger(s): Secondary | ICD-10-CM | POA: Insufficient documentation

## 2021-02-17 DIAGNOSIS — M79645 Pain in left finger(s): Secondary | ICD-10-CM

## 2021-02-17 DIAGNOSIS — I700 Atherosclerosis of aorta: Secondary | ICD-10-CM | POA: Insufficient documentation

## 2021-02-17 DIAGNOSIS — Z6841 Body Mass Index (BMI) 40.0 and over, adult: Secondary | ICD-10-CM | POA: Insufficient documentation

## 2021-02-17 HISTORY — DX: Effusion, right ankle: M25.471

## 2021-02-17 HISTORY — DX: Effusion, right ankle: M25.571

## 2021-02-17 HISTORY — DX: Right upper quadrant pain: R10.11

## 2021-02-17 LAB — CBC WITH DIFFERENTIAL/PLATELET
Abs Immature Granulocytes: 0.02 10*3/uL (ref 0.00–0.07)
Basophils Absolute: 0 10*3/uL (ref 0.0–0.1)
Basophils Relative: 0 %
Eosinophils Absolute: 0.3 10*3/uL (ref 0.0–0.5)
Eosinophils Relative: 3 %
HCT: 40.7 % (ref 36.0–46.0)
Hemoglobin: 13.7 g/dL (ref 12.0–15.0)
Immature Granulocytes: 0 %
Lymphocytes Relative: 28 %
Lymphs Abs: 2.5 10*3/uL (ref 0.7–4.0)
MCH: 29.5 pg (ref 26.0–34.0)
MCHC: 33.7 g/dL (ref 30.0–36.0)
MCV: 87.5 fL (ref 80.0–100.0)
Monocytes Absolute: 0.6 10*3/uL (ref 0.1–1.0)
Monocytes Relative: 7 %
Neutro Abs: 5.6 10*3/uL (ref 1.7–7.7)
Neutrophils Relative %: 62 %
Platelets: 319 10*3/uL (ref 150–400)
RBC: 4.65 MIL/uL (ref 3.87–5.11)
RDW: 13.5 % (ref 11.5–15.5)
WBC: 9 10*3/uL (ref 4.0–10.5)
nRBC: 0 % (ref 0.0–0.2)

## 2021-02-17 LAB — COMPREHENSIVE METABOLIC PANEL
ALT: 18 U/L (ref 0–44)
AST: 13 U/L — ABNORMAL LOW (ref 15–41)
Albumin: 4.6 g/dL (ref 3.5–5.0)
Alkaline Phosphatase: 55 U/L (ref 38–126)
Anion gap: 13 (ref 5–15)
BUN: 27 mg/dL — ABNORMAL HIGH (ref 6–20)
CO2: 26 mmol/L (ref 22–32)
Calcium: 9.6 mg/dL (ref 8.9–10.3)
Chloride: 102 mmol/L (ref 98–111)
Creatinine, Ser: 0.97 mg/dL (ref 0.44–1.00)
GFR, Estimated: >60 mL/min (ref >60)
Glucose, Bld: 112 mg/dL — ABNORMAL HIGH (ref 70–99)
Potassium: 3.5 mmol/L (ref 3.5–5.1)
Sodium: 141 mmol/L (ref 135–145)
Total Bilirubin: 0.5 mg/dL (ref 0.3–1.2)
Total Protein: 8.1 g/dL (ref 6.5–8.1)

## 2021-02-17 LAB — LIPID PANEL
Cholesterol: 184 mg/dL (ref 0–200)
HDL: 46 mg/dL (ref >40)
LDL Cholesterol: 102 mg/dL — ABNORMAL HIGH (ref 0–99)
Total CHOL/HDL Ratio: 4 RATIO
Triglycerides: 180 mg/dL — ABNORMAL HIGH (ref <150)
VLDL: 36 mg/dL (ref 0–40)

## 2021-02-17 LAB — HEMOGLOBIN A1C
Hgb A1c MFr Bld: 6.2 % — ABNORMAL HIGH (ref 4.8–5.6)
Mean Plasma Glucose: 131.24 mg/dL

## 2021-02-17 MED ORDER — WEGOVY 1.7 MG/0.75ML ~~LOC~~ SOAJ
1.7000 mg | SUBCUTANEOUS | 1 refills | Status: DC
Start: 2021-02-17 — End: 2021-02-17

## 2021-02-17 NOTE — Assessment & Plan Note (Signed)
BMI 41.92 in office today. 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 Weight loss difficult despite dietary changes.  Will obtain labs today for evaluation of TSH, A1c, CBC, and CMP.   Recommend increasing physical activity as tolerated with daily walks of at least 20 minutes once the ankle has healed.  Will plan to stop Ozempic 1mg  and start Wegovy 1.7mg  for diabetes and weight control management. Will likely need prior authorization.  7 days after last dose of Ozempic start Wegovy and STOP Ozempic.  Will plan to follow-up in 6 months or sooner if needed.

## 2021-02-17 NOTE — Assessment & Plan Note (Addendum)
History of aortic atherosclerosis noted on recent CT study. She is on statin therapy. Will obtain labs today for Lipids and diabetes control. BP well controlled today. No bruits noted. No chest pain, shortness of breath, or claudication. Varicose veins observed.   Recommend continued monitoring of blood pressure and blood sugars and continued medication management. Tight medication control vital for reduction of stroke and heart attack.  Monitor for signs of chest pain, shortness of breath, leg pain.  Follow-up to emergency room if emergency symptoms present.

## 2021-02-17 NOTE — Assessment & Plan Note (Signed)
Primary diarrhea with intermittent constipation symptoms present. Symptoms and presentation sound consistent with IBS. No bleeding noted. Intermittent greasy stool appearance concerning for possible malabsorption.  Will perform chart review for evaluation of last colonoscopy and follow-up recommendations and make changes to plan of care based on results.   Recommend monitoring symptoms to see if certain foods or stress trigger worsening of symptoms. A food journal Nobile help determine source of symptoms if one is present.  Will make changes to plan of care based on chart review.  If symptoms worsen or fail to improve, please let me know.

## 2021-02-17 NOTE — Patient Instructions (Addendum)
I am so happy to see you today! Please let me know if you need anything!  I have sent the referral for PT and for vascular for you. If you don't hear anything in the next week, please let me know and I will check into this for you.   I have sent the order for the x-ray of your ankle. You can have this done today downstairs and have your labs drawn at the same time.   I sent in a prescription for [facility]- this is the same medication as Ozempic, but used for weight loss in higher doses. We will likely need to get a prior authorization, but we will work on that. Our insurance usually covers all but $25 copay a month.  Once you get this, stop the Ozempic and transition to [facility] only for both diabetes and weight loss. Let me know how you are feeling on this and we can increase to the highest dose in another month or two.     Thank you for choosing [place] at [facility] for your Primary Care needs. I am excited for the opportunity to partner with you to meet your health care goals. It was a pleasure meeting you today!  I am an Adult-Geriatric Nurse Practitioner with a background in caring for patients for more than 20 years. I received my Paediatric nurse in Nursing and my Doctor of Nursing Practice degrees at [employer]. I received additional fellowship training in primary care and sports medicine after receiving my doctorate degree. I provide primary care and sports medicine services to patients age 54 and older within this office. I am also a provider with the [facility] and the director of the APP Fellowship with [facility].  I am a [place] native, but have called the [place] area home for nearly 20 years and am proud to be a member of this community.   I am passionate about providing the best service to you through preventive medicine and supportive care. I consider you a part of the medical team and value your input. I work  diligently to ensure that you are heard and your needs are met in a safe and effective manner. I want you to feel comfortable with me as your provider and want you to know that your health concerns are important to me.   For your information, our office hours are Monday- Friday 8:00 AM - 5:00 PM At this time I am not in the office on Wednesdays.  If you have questions or concerns, please call our office at [phone] or send [place] a MyChart message and we will respond as quickly as possible.   For all urgent or time sensitive needs we ask that you please call the office to avoid delays. MyChart is not constantly monitored and replies Oconnell take up to 72 business hours.  MyChart Policy: . MyChart allows for you to see your visit notes, after visit summary, provider recommendations, lab and tests results, make an appointment, request refills, and contact your provider or the office for non-urgent questions or concerns.  . Providers are seeing patients during normal business hours and do not have built in time to review MyChart messages. We ask that you allow a minimum of 72 business hours for MyChart message responses.  . Complex MyChart concerns Steagall require a visit. Your provider Lantis request you schedule a virtual or in person visit to ensure we are providing the best care  possible. . MyChart messages sent after 4:00 PM on Friday will not be received by the provider until Monday morning.    Lab and Test Results: . You will receive your lab and test results on MyChart as soon as they are completed and results have been sent by the lab or testing facility. Due to this service, you will receive your results BEFORE your provider.  . Please allow a minimum of 72 business hours for your provider to receive and review lab and test results and contact you about.   . Most lab and test result comments from the provider will be sent through Edgar. Your provider Haub recommend changes to the plan of care,  follow-up visits, repeat testing, ask questions, or request an office visit to discuss these results. You Seabolt reply directly to this message or call the office at [phone] to provide information for the provider or set up an appointment. . In some instances, you will be called with test results and recommendations. Please let us know if this is preferred and we will make note of this in your chart to provide this for you.    . If you have not heard a response to your lab or test results in 72 business hours, please call the office to let us know.   After Hours: . For all non-emergency after hours needs, please call the office at [phone] and select the option to reach the on-call provider service. On-call services are shared between multiple [place] offices and therefore it will not be possible to speak directly with your provider. On-call providers Goetsch provide medical advice and recommendations, but are unable to provide refills for maintenance medications.  . For all emergency or urgent medical needs after normal business hours, we recommend that you seek care at the closest Urgent Care or Emergency Department to ensure appropriate treatment in a timely manner.  Nigel Bridgeman [place] at [facility] has a 24 hour emergency room located on the ground floor for your convenience.    Please do not hesitate to reach out to [place] with concerns.   Thank you, again, for choosing me as your health care partner. I appreciate your trust and look forward to working with you! Worthy Keeler, DNP, AGNP-c ____________________________________________________________________________________________  Health Maintenance Recommendations Screening Testing  Mammogram  Every 1 -2 years based on history and risk factors  Starting at age 7  Pap Smear  Ages 21-39 every 3 years  Ages 75-65 every 5 years with HPV testing  More frequent testing Canion be required based on results and history  Colon  Cancer Screening  Every 1-10 years based on test performed, risk factors, and history  Starting at age 50  Bone Density Screening  Every 2-10 years based on history  Starting at age 38  Screening Labs  Routine  Labs: Complete Blood Count (CBC), Complete Metabolic Panel (CMP), Cholesterol (Lipid Panel)  Every 6-12 months based on history and medications  Atwood be recommended more frequently based on current conditions or previous results  Hemoglobin A1c Lab  Every 3-12 months based on history and previous results  Starting at age 68 or earlier with diagnosis of diabetes, high cholesterol, BMI >26, and/or risk factors  Frequent monitoring for patients with diabetes to ensure blood sugar control  Thyroid Panel (TSH w/ T3 & T4)  Every 6 months based on history, symptoms, and risk factors  Bean be repeated more often if on medication  HIV  One time testing for all  patients 31 and older  Pavlock be repeated more frequently for patients with increased risk factors or exposure  Hepatitis C  One time testing for all patients 74 and older  Pribyl be repeated more frequently for patients with increased risk factors or exposure  Gonorrhea, Chlamydia,   Vaccine Recommendations  Tetanus Booster  All adults every 10 years  Flu Vaccine  All patients 6 months and older every year  COVID Vaccine  All patients 12 years and older  Initial dosing with booster  Ruben recommend additional booster based on age and health history  HPV Vaccine  2 doses all patients age 70-26  Dosing Boyers be considered for patients over 26  Shingles Vaccine (Shingrix)  2 doses all adults 69 years and older  Pneumonia (Pneumovax 23)  All adults 77 years and older  Giammona recommend earlier dosing based on health history  Pneumonia (Prevnar 13)  All adults 52 years and older  Dosed 1 year after Pneumovax 23  Additional Screening, Testing, and Vaccinations Mikowski be recommended on an  individualized basis based on family history, health history, risk factors, and/or exposure.   _____________________________________________________________________________________________    Fat and Cholesterol Restricted Eating Plan Getting too much fat and cholesterol in your diet Bearden cause health problems. Choosing the right foods helps keep your fat and cholesterol at normal levels. This can keep you from getting certain diseases. Your doctor Hynek recommend an eating plan that includes:  Total fat: ______% or less of total calories a day.  Saturated fat: ______% or less of total calories a day.  Cholesterol: less than _________mg a day.  Fiber: ______g a day. What are tips for following this plan? Meal planning  At meals, divide your plate into four equal parts: ? Fill one-half of your plate with vegetables and green salads. ? Fill one-fourth of your plate with whole grains. ? Fill one-fourth of your plate with low-fat (lean) protein foods.  Eat fish that is high in omega-3 fats at least two times a week. This includes mackerel, tuna, sardines, and salmon.  Eat foods that are high in fiber, such as whole grains, beans, apples, broccoli, carrots, peas, and barley. General tips  Work with your doctor to lose weight if you need to.  Avoid: ? Foods with added sugar. ? Fried foods. ? Foods with partially hydrogenated oils.  Limit alcohol intake to no more than 1 drink a day for nonpregnant women and 2 drinks a day for men. One drink equals 12 oz of beer, 5 oz of wine, or 1 oz of hard liquor.   Reading food labels  Check food labels for: ? Trans fats. ? Partially hydrogenated oils. ? Saturated fat (g) in each serving. ? Cholesterol (mg) in each serving. ? Fiber (g) in each serving.  Choose foods with healthy fats, such as: ? Monounsaturated fats. ? Polyunsaturated fats. ? Omega-3 fats.  Choose grain products that have whole grains. Look for the word "whole" as the  first word in the ingredient list. Cooking  Cook foods using low-fat methods. These include baking, boiling, grilling, and broiling.  Eat more home-cooked foods. Eat at restaurants and buffets less often.  Avoid cooking using saturated fats, such as butter, cream, palm oil, palm kernel oil, and coconut oil. Recommended foods Fruits  All fresh, canned (in natural juice), or frozen fruits. Vegetables  Fresh or frozen vegetables (raw, steamed, roasted, or grilled). Green salads. Grains  Whole grains, such as whole wheat or whole grain breads, crackers,  cereals, and pasta. Unsweetened oatmeal, bulgur, barley, quinoa, or brown rice. Corn or whole wheat flour tortillas. Meats and other protein foods  Ground beef (85% or leaner), grass-fed beef, or beef trimmed of fat. Skinless chicken or [place]. Ground chicken or [place]. Pork trimmed of fat. All fish and seafood. Egg whites. Dried beans, peas, or lentils. Unsalted nuts or seeds. Unsalted canned beans. Nut butters without added sugar or oil. Dairy  Low-fat or nonfat dairy products, such as skim or 1% milk, 2% or reduced-fat cheeses, low-fat and fat-free ricotta or cottage cheese, or plain low-fat and nonfat yogurt. Fats and oils  Tub margarine without trans fats. Light or reduced-fat mayonnaise and salad dressings. Avocado. Olive, canola, sesame, or safflower oils. The items listed above Harder not be a complete list of foods and beverages you can eat. Contact a dietitian for more information.   Foods to avoid Fruits  Canned fruit in heavy syrup. Fruit in cream or butter sauce. Fried fruit. Vegetables  Vegetables cooked in cheese, cream, or butter sauce. Fried vegetables. Grains  White bread. White pasta. White rice. Cornbread. Bagels, pastries, and croissants. Crackers and snack foods that contain trans fat and hydrogenated oils. Meats and other protein foods  Fatty cuts of meat. Ribs, chicken wings, bacon, sausage, bologna, salami,  chitterlings, fatback, hot dogs, bratwurst, and packaged lunch meats. Liver and organ meats. Whole eggs and egg yolks. Chicken and [place] with skin. Fried meat. Dairy  Whole or 2% milk, cream, half-and-half, and cream cheese. Whole milk cheeses. Whole-fat or sweetened yogurt. Full-fat cheeses. Nondairy creamers and whipped toppings. Processed cheese, cheese spreads, and cheese curds. Beverages  Alcohol. Sugar-sweetened drinks such as sodas, lemonade, and fruit drinks. Fats and oils  Butter, stick margarine, lard, shortening, ghee, or bacon fat. Coconut, palm kernel, and palm oils. Sweets and desserts  Corn syrup, sugars, honey, and molasses. Candy. Jam and jelly. Syrup. Sweetened cereals. Cookies, pies, cakes, donuts, muffins, and ice cream. The items listed above Keys not be a complete list of foods and beverages you should avoid. Contact a dietitian for more information. Summary  Choosing the right foods helps keep your fat and cholesterol at normal levels. This can keep you from getting certain diseases.  At meals, fill one-half of your plate with vegetables and green salads.  Eat high-fiber foods, like whole grains, beans, apples, carrots, peas, and barley.  Limit added sugar, saturated fats, alcohol, and fried foods. This information is not intended to replace advice given to you by your health care provider. Make sure you discuss any questions you have with your health care provider. Document Revised: 03/20/2020 Document Reviewed: 03/20/2020 Elsevier Patient Education  2021 [place].   Diabetes Mellitus and Standards of Medical Care Living with and managing diabetes (diabetes mellitus) can be complicated. Your diabetes treatment Massing be managed by a team of health care providers, including:  A physician who specializes in diabetes (endocrinologist). You might also have visits with a nurse practitioner or physician assistant.  Nurses.  A registered dietitian.  A certified  diabetes care and education specialist.  An exercise specialist.  A pharmacist.  An eye doctor.  A foot specialist (podiatrist).  A dental care provider.  A primary care provider.  A mental health care provider. How to manage your diabetes You can do many things to successfully manage your diabetes. Your health care providers will follow guidelines to help you get the best quality of care. Here are general guidelines for your diabetes management plan. Your  health care providers Pellegrini give you more specific instructions. Physical exams When you are diagnosed with diabetes, and each year after that, your health care provider will ask about your medical and family history. You will have a physical exam, which Karel include:  Measuring your height, weight, and body mass index (BMI).  Checking your blood pressure. This will be done at every routine medical visit. Your target blood pressure Dobrowolski vary depending on your medical conditions, your age, and other factors.  A thyroid exam.  A skin exam.  Screening for nerve damage (peripheral neuropathy). This Demarais include checking the pulse in your legs and feet and the level of sensation in your hands and feet.  A foot exam to inspect the structure and skin of your feet, including checking for cuts, bruises, redness, blisters, sores, or other problems.  Screening for blood vessel (vascular) problems. This Stallworth include checking the pulse in your legs and feet and checking your temperature. Blood tests Depending on your treatment plan and your personal needs, you Skiver have the following tests:  Hemoglobin A1C (HbA1C). This test provides information about blood sugar (glucose) control over the previous 2-3 months. It is used to adjust your treatment plan, if needed. This test will be done: ? At least 2 times a year, if you are meeting your treatment goals. ? 4 times a year, if you are not meeting your treatment goals or if your goals have  changed.  Lipid testing, including total cholesterol, LDL and HDL cholesterol, and triglyceride levels. ? The goal for LDL is less than 100 mg/dL (5.5 mmol/L). If you are at high risk for complications, the goal is less than 70 mg/dL (3.9 mmol/L). ? The goal for HDL is 40 mg/dL (2.2 mmol/L) or higher for men, and 50 mg/dL (2.8 mmol/L) or higher for women. An HDL cholesterol of 60 mg/dL (3.3 mmol/L) or higher gives some protection against heart disease. ? The goal for triglycerides is less than 150 mg/dL (8.3 mmol/L).  Liver function tests.  Kidney function tests.  Thyroid function tests.   Dental and eye exams  Visit your dentist two times a year.  If you have type 1 diabetes, your health care provider Duhe recommend an eye exam within 5 years after you are diagnosed, and then once a year after your first exam. ? For children with type 1 diabetes, the health care provider Cloward recommend an eye exam when your child is age 86 or older and has had diabetes for 3-5 years. After the first exam, your child should get an eye exam once a year.  If you have type 2 diabetes, your health care provider Barman recommend an eye exam as soon as you are diagnosed, and then every 1-2 years after your first exam.   Immunizations  A yearly flu (influenza) vaccine is recommended annually for everyone 6 months or older. This is especially important if you have diabetes.  The pneumonia (pneumococcal) vaccine is recommended for everyone 2 years or older who has diabetes. If you are age 88 or older, you Olano get the pneumonia vaccine as a series of two separate shots.  The hepatitis B vaccine is recommended for adults shortly after being diagnosed with diabetes. Adults and children with diabetes should receive all other vaccines according to age-specific recommendations from the Centers for Disease Control and Prevention (CDC). Mental and emotional health Screening for symptoms of eating disorders, anxiety, and  depression is recommended at the time of diagnosis and after as  needed. If your screening shows that you have symptoms, you Stoecker need more evaluation. You Camacho work with a mental health care provider. Follow these instructions at home: Treatment plan You will monitor your blood glucose levels and Wildasin give yourself insulin. Your treatment plan will be reviewed at every medical visit. You and your health care provider will discuss:  How you are taking your medicines, including insulin.  Any side effects you have.  Your blood glucose level target goals.  How often you monitor your blood glucose level.  Lifestyle habits, such as activity level and tobacco, alcohol, and substance use. Education Your health care provider will assess how well you are monitoring your blood glucose levels and whether you are taking your insulin and medicines correctly. He or she Trudel refer you to:  A certified diabetes care and education specialist to manage your diabetes throughout your life, starting at diagnosis.  A registered dietitian who can create and review your personal nutrition plan.  An exercise specialist who can discuss your activity level and exercise plan. General instructions  Take over-the-counter and prescription medicines only as told by your health care provider.  Keep all follow-up visits. This is important. Where to find support There are many diabetes support networks, including:  American Diabetes Association (ADA): diabetes.org  Defeat Diabetes Foundation: defeatdiabetes.org Where to find more information  American Diabetes Association (ADA): www.diabetes.org  Association of Diabetes Care & Education Specialists (ADCES): diabeteseducator.org  International Diabetes Federation (IDF): https://www.munoz-bell.org/ Summary  Managing diabetes (diabetes mellitus) can be complicated. Your diabetes treatment Blank be managed by a team of health care providers.  Your health care providers follow guidelines  to help you get the best quality care.  You should have physical exams, blood tests, blood pressure monitoring, immunizations, and screening tests regularly. Stay updated on how to manage your diabetes.  Your health care providers Mooradian also give you more specific instructions based on your individual health. This information is not intended to replace advice given to you by your health care provider. Make sure you discuss any questions you have with your health care provider. Document Revised: 05/23/2020 Document Reviewed: 05/23/2020 Elsevier Patient Education  2021 [place].   Diabetes Mellitus and Colwyn care is an important part of your health, especially when you have diabetes. Diabetes Erber cause you to have problems because of poor blood flow (circulation) to your feet and legs, which can cause your skin to:  Become thinner and drier.  Break more easily.  Heal more slowly.  Peel and crack. You Posadas also have nerve damage (neuropathy) in your legs and feet, causing decreased feeling in them. This means that you Hensarling not notice minor injuries to your feet that could lead to more serious problems. Noticing and addressing any potential problems early is the best way to prevent future foot problems. How to care for your feet Foot hygiene  Wash your feet daily with warm water and mild soap. Do not use hot water. Then, pat your feet and the areas between your toes until they are completely dry. Do not soak your feet as this can dry your skin.  Trim your toenails straight across. Do not dig under them or around the cuticle. File the edges of your nails with an emery board or nail file.  Apply a moisturizing lotion or petroleum jelly to the skin on your feet and to dry, brittle toenails. Use lotion that does not contain alcohol and is unscented. Do not apply lotion  between your toes.   Shoes and socks  Wear clean socks or stockings every day. Make sure they are not too tight. Do  not wear knee-high stockings since they Posadas decrease blood flow to your legs.  Wear shoes that fit properly and have enough cushioning. Always look in your shoes before you put them on to be sure there are no objects inside.  To break in new shoes, wear them for just a few hours a day. This prevents injuries on your feet. Wounds, scrapes, corns, and calluses  Check your feet daily for blisters, cuts, bruises, sores, and redness. If you cannot see the bottom of your feet, use a mirror or ask someone for help.  Do not cut corns or calluses or try to remove them with medicine.  If you find a minor scrape, cut, or break in the skin on your feet, keep it and the skin around it clean and dry. You Kooyman clean these areas with mild soap and water. Do not clean the area with peroxide, alcohol, or iodine.  If you have a wound, scrape, corn, or callus on your foot, look at it several times a day to make sure it is healing and not infected. Check for: ? Redness, swelling, or pain. ? Fluid or blood. ? Warmth. ? Pus or a bad smell.   General tips  Do not cross your legs. This Wise decrease blood flow to your feet.  Do not use heating pads or hot water bottles on your feet. They Crumm burn your skin. If you have lost feeling in your feet or legs, you Houchins not know this is happening until it is too late.  Protect your feet from hot and cold by wearing shoes, such as at the beach or on hot pavement.  Schedule a complete foot exam at least once a year (annually) or more often if you have foot problems. Report any cuts, sores, or bruises to your health care provider immediately. Where to find more information  American Diabetes Association: www.diabetes.org  Association of Diabetes Care & Education Specialists: www.diabeteseducator.org Contact a health care provider if:  You have a medical condition that increases your risk of infection and you have any cuts, sores, or bruises on your feet.  You have an  injury that is not healing.  You have redness on your legs or feet.  You feel burning or tingling in your legs or feet.  You have pain or cramps in your legs and feet.  Your legs or feet are numb.  Your feet always feel cold.  You have pain around any toenails. Get help right away if:  You have a wound, scrape, corn, or callus on your foot and: ? You have pain, swelling, or redness that gets worse. ? You have fluid or blood coming from the wound, scrape, corn, or callus. ? Your wound, scrape, corn, or callus feels warm to the touch. ? You have pus or a bad smell coming from the wound, scrape, corn, or callus. ? You have a fever. ? You have a red line going up your leg. Summary  Check your feet every day for blisters, cuts, bruises, sores, and redness.  Apply a moisturizing lotion or petroleum jelly to the skin on your feet and to dry, brittle toenails.  Wear shoes that fit properly and have enough cushioning.  If you have foot problems, report any cuts, sores, or bruises to your health care provider immediately.  Schedule a complete foot exam  at least once a year (annually) or more often if you have foot problems. This information is not intended to replace advice given to you by your health care provider. Make sure you discuss any questions you have with your health care provider. Document Revised: 06/06/2020 Document Reviewed: 06/06/2020 Elsevier Patient Education  [place].

## 2021-02-17 NOTE — Assessment & Plan Note (Signed)
Increased reports of stress incontinence requiring daily protection from leakage. Ongoing issues, but worsening with age.  No signs of UTI or kidney issues, but will obtain labs for CMP and A1c today to rule out diabetes and kidney dysfunction.   Recommend continued Kegal exercises- although minimally effective Sumpter help.  Referral for pelvic PT placed at [facility] in [place] for evaluation. If you have not heard from them in 1 week, please let us know.  Follow-up if symptoms worsen or fail to improve with treatment.

## 2021-02-17 NOTE — Assessment & Plan Note (Addendum)
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 Non-tender to touch with no indication of edema at time of examination. Pedal pulses strong and equal bilaterally.    Recommend continued use of compression stockings and monitor sodium intake to help with edema. Elevate lower extremities while seated.  Monitor for edema that does not improve overnight or with elevation, pain or tenderness to the calves, and warmth and redness to the skin. Notify us if any of these occur.  Referral to vascular surgery sent for further evaluation and recommendations. If you have not heard from them for scheduling in 1 week, let us know.  Follow-up if symptoms worsen or fail to improve.

## 2021-02-17 NOTE — Progress Notes (Signed)
BP 113/66   Pulse 74   Ht 5\' 4"  (1.626 m)   Wt 244 lb 3.2 oz (110.8 kg)   SpO2 99%   BMI 41.92 kg/m    Subjective:    Patient ID: Michelle Ray, female    DOB: [birth year], 55 y.o.   MRN: [id number]  HPI: Michelle Ray is a pleasant 55 y.o. female presenting on 02/17/2021 for to establish care with this practice and for comprehensive medical examination.   Current medical concerns include:RUQ abdominal pain, varicose veins, intermiitent diarhhea and constipation, Left ankle pain and swelling, thumb pain- bilateral, and stress incontonence.   RUQ Pain She reports ongoing RUQ and epigastric pain with intermittent radiation around to back. She tells me that this has been ongoing for years now and she has become accustomed to it, although, she would like to know the etiology and to control it. She denies worsening symptoms prior to eating or after eating. She tells me that nothing in particular seems to make it better or worse. Her gallbladder is intact and has been evaluated. She does not drink alcohol. She reports that there were no medication changes when the pain started. She tells me that she occasionally has nausea due to this and rarely vomiting. She is unsure of labs that have been performed for evaluation and she has not been tested or treated for H. Pylori to her knowledge. She has no bloody or dark stools and no bloody or coffee ground emesis. She describes the pain as a cramping and occasional stabbing sensation. She reports the symptoms have not improved or worsened over the years. She describes no difference with PPI use.   Varicose Veins She reports bilateral lower extremity varicose veins that started after pregnancy many years ago. She tells me they have progressively worsened over the years. 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 She does wear compression stockings, which she reports helps with the aching and edema, but the varicosities  continue to worsen. She also reports the presence of multiple superficial spider veins throughout her lower extremities. She denies edema that does not resolve with elevation, warmth, pain in the calves, or redness to the calves. She has not seen a vascular specialist for this problem in the past.   Left Ankle Pain and Swelling She reports pain and swelling to the anterior/medial surface of her right ankle that has been ongoing for about 1.5 to 2 weeks. She tells me that she has a history of ankle sprain in this ankle on several occassions, but she cannot remember any injury that Balducci have provoked this occurrence. She wears compression stockings on a regular basis and has tried an ankle brace in the past, but this was uncomfortable and therefore she does not wear it consistently. She reports wearing well supportive sneakers, but she does work long hours on her feet. She reports a couple of falls in the past year when losing her balance on uneven surfaces and she feels this Levario be due to ankle weakness.   Bilateral Thumb Pain She endorses bilateral pain to the PIP that is worse with movement. She denies any deformation of the joints, chronic swelling, or significant weakness. She reports that her grip is not as strong as it has been due to pain, however. She has no family or personal history of rheumatic disease. She denies known injury to the thumbs or joints of the hands.   Stress Incontinence She  reports increased stress incontinence to the point that she must wear a protective pad to prevent leakage onto her clothing. She endorses this has been worsening with age. She practices kegal exercises, but does not feel that this make a difference. This is not a sudden occurrence but has been worsening over the years. She denies vaginal symptoms or other urinary symptoms including burning, blood, or notice of prolapse.   Past Medical History:  Past Medical History:  Diagnosis Date  . Allergy   . Anemia    . Aortic atherosclerosis ([place])   . Benign secondary hypertension due to renal artery stenosis (HCC)   . Constipation    uses OTC stool softener prn only- this is not chronic  . Diabetes mellitus without complication ([place])   . GERD (gastroesophageal reflux disease)   . Hyperlipemia   . Hyperlipidemia   . Hypertension    Medications:  Current Outpatient Medications on File Prior to Visit  Medication Sig  . AMBULATORY NON FORMULARY MEDICATION IBgard Take 1-2 capsules by mouth three times daily as needed.  Marland Kitchen amLODipine (NORVASC) 10 MG tablet Take 10 mg by mouth daily.  . benazepril (LOTENSIN) 20 MG tablet Take 20 mg by mouth daily.  Marland Kitchen esomeprazole (NEXIUM) 40 MG capsule Take 40 mg by mouth daily.  . furosemide (LASIX) 40 MG tablet Take 40 mg by mouth daily as needed.   Marland Kitchen omeprazole (PRILOSEC) 40 MG capsule Take 40 mg by mouth daily.  . ondansetron (ZOFRAN) 4 MG tablet Take 1 tablet (4 mg total) by mouth every 6 (six) hours as needed for nausea.  . rosuvastatin (CRESTOR) 10 MG tablet Take 10 mg by mouth daily.  Marland Kitchen triamterene-hydrochlorothiazide (DYAZIDE) 37.5-25 MG capsule Take 1 capsule by mouth daily.   No current facility-administered medications on file prior to visit.    She currently lives with: Her husband, Randall Hiss.   She reports regular vision exams q1-5y: yes She reports regular dental exams q 94m: yes Her diet consists of: Follow low carbohydrate diet mostly. 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  She denies ETOH use  She denies nictoine use  She denies illegal substance use   She is postmenopausal She reports no menstrual bleeding or cramping Current menopausal symptoms: hot flashes- occasionally at night  She is currently sexually active with single partner, contraception - post menopausal  status She denies concerns today about STI  She denies concerns about skin changes today:  She endorses concerns about bowel changes today: abdominal pain and steatorrhea She endorses concerns about bladder changes today: urgency and stress incontinence  Depression Screen done today and results listed below:  Depression screen [facility] 2/9 02/17/2021  Decreased Interest 0  Down, Depressed, Hopeless 0  PHQ - 2 Score 0   Anxiety Screening done and confirms the above findings GAD 7 : Generalized Anxiety Score 02/17/2021  Nervous, Anxious, on Edge 0  Control/stop worrying 0  Worry too much - different things 0  Trouble relaxing 0  Restless 0  Easily annoyed or irritable 0  Afraid - awful might happen 0  Total GAD 7 Score 0    She has a history of falls. I did complete a risk assessment for falls. A plan of care for falls was documented. Fall Risk  02/17/2021  Falls in the past year?  1  Number falls in past yr: 1  Injury with Fall? 0  Risk for fall due to : Orthopedic patient;History of fall(s)  Follow up Falls evaluation completed;Falls prevention discussed   Surgical History:  Past Surgical History:  Procedure Laterality Date  . ABLATION    . TUBAL LIGATION    . uterine fibroid removal    . WISDOM TOOTH EXTRACTION     Allergies:  Allergies  Allergen Reactions  . Macrobid [Nitrofurantoin]   . Pravastatin     Joint pain   Social History:  Social History   Socioeconomic History  . Marital status: Married    Spouse name: Randall Hiss  . Number of children: Not on file  . Years of education: Not on file  . Highest education level: Master's degree (e.g., MA, MS, MEng, MEd, MSW, MBA)  Occupational History  . 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 Smoking status: Former Research scientist (life sciences)  . Smokeless tobacco: Never Used  Vaping Use  . Vaping Use: Never used  Substance and Sexual Activity  . Alcohol use: No  . Drug use: No  . Sexual activity: Yes     Partners: Male    Birth control/protection: Post-menopausal  Other Topics Concern  . 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 Married to Conrad. Adult children.    Social Determinants of Health   Financial Resource Strain: Not on file  Food Insecurity: Not on file  Transportation Needs: Not on file  Physical Activity: Not on file  Stress: Not on file  Social Connections: Not on file  Intimate Partner Violence: Not on file   Social History   Tobacco Use  Smoking Status Former Smoker  Smokeless Tobacco Never Used   Social History   Substance and Sexual Activity  Alcohol Use No    Family History:  Family History  Problem Relation Age of Onset  . Cancer Mother   . Atrial fibrillation Mother   . Pulmonary embolism Mother   . Heart failure Sister   . Hypertension Maternal Grandmother   . Heart disease Maternal Grandfather   . Stroke Paternal Grandfather   . Colon cancer Neg Hx   . Colon polyps Neg Hx     Past medical history, surgical history, medications, allergies, family history and social history reviewed with patient today and changes made to appropriate areas of the chart.   All ROS negative except what is listed above and in the HPI.      Objective:    BP 113/66   Pulse 74   Ht 5\' 4"  (1.626 m)   Wt 244 lb 3.2 oz (110.8 kg)   SpO2 99%   BMI 41.92 kg/m   Wt Readings from Last 3 Encounters:  02/17/21 244 lb 3.2 oz (110.8 kg)  12/10/20 249 lb (112.9 kg)  04/30/17 251 lb 9.6 oz (114.1 kg)    Physical Exam Vitals and nursing note reviewed.  Constitutional:      Appearance: She is well-developed.  HENT:     Head: Normocephalic and atraumatic.     Mouth/Throat:     Mouth: Mucous membranes are moist.     Pharynx: Oropharynx is clear.  Eyes:     Extraocular Movements: Extraocular movements intact.     Pupils: Pupils are equal, round, and reactive to light.  Cardiovascular:     Rate and Rhythm: Normal rate and  regular  rhythm.     Heart sounds: Normal heart sounds. No murmur heard.   Pulmonary:     Effort: Pulmonary effort is normal.     Breath sounds: Normal breath sounds.  Abdominal:     General: Abdomen is flat. Bowel sounds are normal. There is no distension or abdominal bruit. There are no signs of injury.     Palpations: Abdomen is soft. There is no shifting dullness, fluid wave, hepatomegaly, splenomegaly or mass.     Tenderness: There is abdominal tenderness in the right upper quadrant and epigastric area. There is no right CVA tenderness, left CVA tenderness, guarding or rebound.     Hernia: No hernia is present.  Skin:    General: Skin is warm and dry.     Capillary Refill: Capillary refill takes less than 2 seconds.  Neurological:     General: No focal deficit present.     Mental Status: She is alert and oriented to person, place, and time.     Cranial Nerves: No cranial nerve deficit.     Motor: No weakness.  Psychiatric:        Mood and Affect: Mood normal.        Behavior: Behavior normal.     Results for orders placed or performed in visit on 12/12/20  ECHOCARDIOGRAM COMPLETE  Result Value Ref Range   AR max vel 2.20 cm2   AV Peak grad 15.1 mmHg   Ao pk vel 1.94 m/s   S' Lateral 3.20 cm   Area-P 1/2 2.95 cm2   AV Area VTI 2.28 cm2   AV Mean grad 7.0 mmHg   AV Area mean vel 2.17 cm2      Assessment & Plan:   Problem List Items Addressed This Visit      Cardiovascular and Mediastinum   HTN (hypertension), benign (Chronic)    Long standing HTN, well controlled at today's visit with BP 113/66. No chest pain, shortness of breath, or palpitations noted. No changes to vision or new headaches. She is tolerating medications well.  Labs ordered today for CMP and CBC.   Recommend continued use of amlodipine, benazepril, and triamterene-HCTZ at current dosages.  No refills needed today- will request from pharmacy when needed.  Follow-up in 6 months or sooner if needed  for HTN      Relevant Orders   CBC with Differential/Platelet (Completed)   Comprehensive metabolic panel (Completed)   Lipid panel (Completed)   Hemoglobin A1c (Completed)   Thyroid Panel With TSH   Varicose veins of both legs with edema    Twisted and enlarged varicosities noted on the medial on posterior surface of the lower extremities bilaterally with R>L. Non-tender to touch with no indication of edema at time of examination. Pedal pulses strong and equal bilaterally.    Recommend continued use of compression stockings and monitor sodium intake to help with edema. Elevate lower extremities while seated.  Monitor for edema that does not improve overnight or with elevation, pain or tenderness to the calves, and warmth and redness to the skin. Notify us if any of these occur.  Referral to vascular surgery sent for further evaluation and recommendations. If you have not heard from them for scheduling in 1 week, let us know.  Follow-up if symptoms worsen or fail to improve.        Relevant Orders   Ambulatory referral to Vascular Surgery   Aortic atherosclerosis ([place])    History of aortic atherosclerosis noted on recent CT  study. She is on statin therapy. Will obtain labs today for Lipids and diabetes control. BP well controlled today. No bruits noted. No chest pain, shortness of breath, or claudication. Varicose veins observed.   Recommend continued monitoring of blood pressure and blood sugars and continued medication management. Tight medication control vital for reduction of stroke and heart attack.  Monitor for signs of chest pain, shortness of breath, leg pain.  Follow-up to emergency room if emergency symptoms present.         Endocrine   Controlled type 2 diabetes mellitus with complication, without long-term current use of insulin (HCC)    T2DM most recent A1c reported at 7.2%.  No signs of neuropathic involvement. Skin and sensation intact with no wounds.  She is on a  statin. She has annual dilated eye exams. She is not due for any vaccinations. Foot exam today.  Labs obtained today, CBC, CMP, A1c, Lipids.   She does express concerns with difficulty losing weight, despite monitoring diet and activity on the job.  She is tolerating Ozempic well with no side effects.  Will plan to change Ozempic 1mg  dosing to [facility] 1.7mg  dosing with option to increase to full 2.4mg  if tolerated after 4 weeks.  7 days after last Ozempic dosage start Wegovy and STOP Ozempic.  Monitor for side effects and let me know if any present.  Medication sent to pharmacy- will likely need prior authorization for Diabetes and Weight Management.   Follow-up in 6 months or sooner if needed for DM      Relevant Medications   Semaglutide-Weight Management (WEGOVY) 1.7 MG/0.75ML SOAJ   Other Relevant Orders   CBC with Differential/Platelet (Completed)   Comprehensive metabolic panel (Completed)   Lipid panel (Completed)   Hemoglobin A1c (Completed)   Thyroid Panel With TSH     Other   Diarrhea    Primary diarrhea with intermittent constipation symptoms present. Symptoms and presentation sound consistent with IBS. No bleeding noted. Intermittent greasy stool appearance concerning for possible malabsorption.  Will perform chart review for evaluation of last colonoscopy and follow-up recommendations and make changes to plan of care based on results.   Recommend monitoring symptoms to see if certain foods or stress trigger worsening of symptoms. A food journal Bar[place] help determine source of symptoms if one is present.  Will make changes to plan of care based on chart review.  If symptoms worsen or fail to improve, please let me know.       Relevant Orders   Thyroid Panel With TSH   Stress incontinence in female    Increased reports of stress incontinence requiring daily protection from leakage. Ongoing issues, but worsening with age.  No signs of UTI or kidney issues, but will obtain  labs for CMP and A1c today to rule out diabetes and kidney dysfunction.   Recommend continued Kegal exercises- although minimally effective Papin help.  Referral for pelvic PT placed at [facility] in [place] for evaluation. If you have not heard from them in 1 week, please let us know.  Follow-up if symptoms worsen or fail to improve with treatment.      Relevant Orders   Ambulatory referral to Physical Therapy   Pain and swelling of right ankle    Right ankle pain with swelling to the medial and anterior surface for approximately 2 weeks. Pain with extension, flexion, and internal rotation noted. Pedal pulses intact with normal coloration and capillary refill. History of multiple sprains to the foot.  Will obtain complete R ankle x-ray today for evaluation of possible hairline fracture or presence of osteoarthritis.  Recommend ankle support in the form of structured brace when at work or on feet for more than 2 hours to help provide additional support.  Recommend well supportive shoes and avoid high heals or sandals.  Rest, Ice, Compression, and Elevation recommended for the next 2-4 days.  Will make changes to the plan of care based on x-ray results.  Ibuprofen and Tylenol [place] be alternated for pain management- avoid long term high doses of ibuprofen due to history of GERD as this Magos exacerbate issue and can cause a raise in BP.  Follow-up if symptoms worsen or fail to improve.       Relevant Orders   DG Ankle Complete Right   Mixed hyperlipidemia    History of hyperlipidemia in the setting of T2DM and HTN. Currently taking rosuvastatin 10mg  daily.  Will obtain labs today for evaluation of numbers.  No refills needed today- patient will notify the pharmacy or the office if refills requested.   Recommend continued use of rosuvastatin daily.  Monitor diet and ensure that saturated fats, fried foods, and foods high in cholesterol are avoided.  Recommend increase in physical  activity once ankle pain has resolved.  Will make changes to the plan of care based on lab results.  Follow-up in 6 months for evaluation      Relevant Orders   CBC with Differential/Platelet (Completed)   Comprehensive metabolic panel (Completed)   Lipid panel (Completed)   Hemoglobin A1c (Completed)   Thyroid Panel With TSH   Right upper quadrant abdominal pain    Constant RUQ pain with intermittent nausea and rare vomiting with intermittent "greasy" stools. Unknown etiology despite extensive GI workup in 2017. Tenderness noted on palpation to RUQ and epigastric region with deep palpation. Liver palpated WNL. Currently on PPI therapy.   Plan to review previous labs results to determine what has been evaluated in the past. Consider possibility of uncontrolled gastric ulcer, HPylori, gall bladder, pancreatic etiology based on presentation and symptoms. Labs today for CBC, CMP, Lipids.   Continue PPI at current dosage.  Will determine follow-up plan after chart review.  Krigbaum consider treatment for H.Pylori if labs normal. She is due for repeat colonoscopy- recommended.  If symptoms worsen with increased semaglutide [facility]) use, let me know.  Follow-up if symptoms worsen or fail to improve.       Relevant Orders   CBC with Differential/Platelet (Completed)   Comprehensive metabolic panel (Completed)   Hemoglobin A1c (Completed)   Thyroid Panel With TSH   BMI 40.0-44.9, adult (HCC)    BMI 41.92 in office today. 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 Weight loss difficult despite dietary changes.  Will obtain labs today for evaluation of TSH, A1c, CBC, and CMP.   Recommend increasing physical activity as tolerated with daily walks of at least 20 minutes once the ankle has healed.  Will plan to stop Ozempic 1mg  and start Wegovy 1.7mg  for diabetes and weight control management. Will likely need prior authorization.  7 days after  last dose of Ozempic start Wegovy and STOP Ozempic.  Will plan to follow-up in 6 months or sooner if needed.       Relevant Medications   Semaglutide-Weight Management (WEGOVY) 1.7 MG/0.75ML SOAJ   Chronic thumb pain, bilateral    Bilateral ongoing thumb pain at the PIP.  No known injury or swelling to the area. Slight laxity of the left thumb pip noted with ligament popping sensation palpated with movement. No deformity noted to these or other joints.   Strongly suspect osteoarthritic etiology based on symptoms and presentation.  Recommend ice and heat at necessary. Segal also consider ketorolac gel application to the area daily to see if this helps with symptoms.  Martire also take Tylenol for pain management, avoid more than 3grams per day total dosing.  If pain continues or worsens despite conservative treatment, recommend follow-up for imaging of joints and possible injection for pain management if no other etiology determined.        Other Visit Diagnoses    Encounter for medical examination to establish care    -  Primary       Follow up plan: Return in about 6 months (around 08/20/2021) for DM/HTN/HLD/Wt.  LABORATORY TESTING:  - Pap smear: up to date - STI testing: deferred  IMMUNIZATIONS:   - Tdap: Tetanus vaccination status reviewed: last tetanus booster within 10 years. - Influenza: Up to date - Pneumovax: Not applicable - Prevnar: Not applicable - HPV: Not applicable - Zostavax vaccine: Not applicable  SCREENING: -Mammogram: Up to date  - Colonoscopy: Needed- due for recall in 06/2017 - Bone Density: Not applicable  -Hearing Test: Not applicable  -Spirometry: Not applicable   PATIENT COUNSELING:   For all adult patients, I recommend   A well balanced diet low in saturated fats, cholesterol, and moderation in carbohydrates.   This can be as simple as monitoring portion sizes and cutting back on sugary beverages such as soda and  juice to start with.    Daily water  consumption of at least 64 ounces.  Physical activity at least 180 minutes per week, if just starting out.   This can be as simple as taking the stairs instead of the elevator and walking 2-3 laps around the office  purposefully every day.   STD protection, partner selection, and regular testing if high risk.  Limited consumption of alcoholic beverages if alcohol is consumed.  For women, I recommend no more than 7 alcoholic beverages per week, spread out throughout the week.  Avoid "binge" drinking or consuming large quantities of alcohol in one setting.   Please let me know if you feel you [place] need help with reduction or quitting alcohol consumption.   Avoidance of nicotine, if used.  Please let me know if you feel you [place] need help with reduction or quitting nicotine use.   Daily mental health attention.  This can be in the form of 5 minute daily meditation, prayer, journaling, yoga, reflection, etc.   Purposeful attention to your emotions and mental state can significantly improve your overall wellbeing  and  Health.  Please know that I am here to help you with all of your health care goals and am happy to work with you to find a solution that works best for you.  The greatest advice I have received with any changes in life are to take it one step at a time, that even means if all you can focus on is the next 60 seconds, then do that and celebrate your victories.  With any changes in life, you will have set backs, and that is OK. The important thing to remember is, if you have a set back, it is not a failure, it is an opportunity to try again!  Health Maintenance Recommendations Screening Testing  Mammogram  Every 1 -2 years based on history and risk factors  Starting at age 74  Pap Smear  Ages 21-39 every 3 years  Ages 36-65 every 5 years with HPV testing  More frequent testing Gerald be required based on results and history  Colon Cancer Screening  Every 1-10 years based  on test performed, risk factors, and history  Starting at age 47  Bone Density Screening  Every 2-10 years based on history  Starting at age 62 for women  Recommendations for men differ based on medication usage, history, and risk factors  AAA Screening  One time ultrasound  Men 60-57 years old who have every smoked  Lung Cancer Screening  Low Dose Lung CT every 12 months  Age 61-80 years with a 30 pack-year smoking history who still smoke or who have quit within the last 15 years  Screening Labs  Routine  Labs: Complete Blood Count (CBC), Complete Metabolic Panel (CMP), Cholesterol (Lipid Panel)  Every 6-12 months based on history and medications  [place] be recommended more frequently based on current conditions or previous results  Hemoglobin A1c Lab  Every 3-12 months based on history and previous results  Starting at age 53 or earlier with diagnosis of diabetes, high cholesterol, BMI >26, and/or risk factors  Frequent monitoring for patients with diabetes to ensure blood sugar control  Thyroid Panel (TSH w/ T3 & T4)  Every 6 months based on history, symptoms, and risk factors  Sehgal be repeated more often if on medication  HIV  One time testing for all patients 91 and older  Seybold be repeated more frequently for patients with increased risk factors or exposure  Hepatitis C  One time testing for all patients 54 and older  Berntsen be repeated more frequently for patients with increased risk factors or exposure  Gonorrhea, Chlamydia  Every 12 months for all sexually active persons 13-24 years  Additional monitoring Boehler be recommended for those who are considered high risk or who have symptoms  PSA  Men 24-57 years old with risk factors  Additional screening Brassfield be recommended from age 60-69 based on risk factors, symptoms, and history  Vaccine Recommendations  Tetanus Booster  All adults every 10 years  Flu Vaccine  All patients 6 months and older  every year  COVID Vaccine  All patients 12 years and older  Initial dosing with booster  Soots recommend additional booster based on age and health history  HPV Vaccine  2 doses all patients age 11-26  Dosing Diss be considered for patients over 26  Shingles Vaccine (Shingrix)  2 doses all adults 66 years and older  Pneumonia (Pneumovax 23)  All adults 50 years and older  Siska recommend earlier dosing based on health history  Pneumonia (Prevnar 36)  All adults 83 years and older  Dosed 1 year after Pneumovax 23  Additional Screening, Testing, and Vaccinations Dellis be recommended on an individualized basis based on family history, health history, risk factors, and/or exposure.   NEXT PREVENTATIVE PHYSICAL DUE IN 1 YEAR. Return in about 6 months (around 08/20/2021) for DM/HTN/HLD/Wt.

## 2021-02-17 NOTE — Assessment & Plan Note (Signed)
Long standing HTN, well controlled at today's visit with BP 113/66. No chest pain, shortness of breath, or palpitations noted. No changes to vision or new headaches. She is tolerating medications well.  Labs ordered today for CMP and CBC.   Recommend continued use of amlodipine, benazepril, and triamterene-HCTZ at current dosages.  No refills needed today- will request from pharmacy when needed.  Follow-up in 6 months or sooner if needed for HTN

## 2021-02-17 NOTE — Assessment & Plan Note (Signed)
Bilateral ongoing thumb pain at the PIP. No known injury or swelling to the area. Slight laxity of the left thumb pip noted with ligament popping sensation palpated with movement. No deformity noted to these or other joints.   Strongly suspect osteoarthritic etiology based on symptoms and presentation.  Recommend ice and heat at necessary. Mchale also consider ketorolac gel application to the area daily to see if this helps with symptoms.  Pompey also take Tylenol for pain management, avoid more than 3grams per day total dosing.  If pain continues or worsens despite conservative treatment, recommend follow-up for imaging of joints and possible injection for pain management if no other etiology determined.

## 2021-02-17 NOTE — Assessment & Plan Note (Signed)
History of hyperlipidemia in the setting of T2DM and HTN. Currently taking rosuvastatin 10mg  daily.  Will obtain labs today for evaluation of numbers.  No refills needed today- patient will notify the pharmacy or the office if refills requested.   Recommend continued use of rosuvastatin daily.  Monitor diet and ensure that saturated fats, fried foods, and foods high in cholesterol are avoided.  Recommend increase in physical activity once ankle pain has resolved.  Will make changes to the plan of care based on lab results.  Follow-up in 6 months for evaluation

## 2021-02-17 NOTE — Assessment & Plan Note (Signed)
Right ankle pain with swelling to the medial and anterior surface for approximately 2 weeks. Pain with extension, flexion, and internal rotation noted. Pedal pulses intact with normal coloration and capillary refill. History of multiple sprains to the foot.   Will obtain complete R ankle x-ray today for evaluation of possible hairline fracture or presence of osteoarthritis.  Recommend ankle support in the form of structured brace when at work or on feet for more than 2 hours to help provide additional support.  Recommend well supportive shoes and avoid high heals or sandals.  Rest, Ice, Compression, and Elevation recommended for the next 2-4 days.  Will make changes to the plan of care based on x-ray results.  Ibuprofen and Tylenol Valade be alternated for pain management- avoid long term high doses of ibuprofen due to history of GERD as this Bagsby exacerbate issue and can cause a raise in BP.  Follow-up if symptoms worsen or fail to improve.

## 2021-02-17 NOTE — Progress Notes (Signed)
Triglycerides slightly elevated with near optimal LDL. Recommend decreasing saturated fats and carbohydrates in diet along with increased physical activity. Will repeat labs in 6 months for re-evaluation.  A1c is down to 6.2!! Plan to continue with increased dose of semaglutide and will follow-up in 3-6 months.   BUN shows slight elevation, fairly consistent with previous results. Creatinine on high end of normal. Avoid NSAIDS and medications known to be hard on the kidneys.   CBC excellent.  Awaiting TSH results.

## 2021-02-17 NOTE — Assessment & Plan Note (Signed)
T2DM most recent A1c reported at 7.2%.  No signs of neuropathic involvement. Skin and sensation intact with no wounds.  She is on a statin. She has annual dilated eye exams. She is not due for any vaccinations. Foot exam today.  Labs obtained today, CBC, CMP, A1c, Lipids.   She does express concerns with difficulty losing weight, despite monitoring diet and activity on the job.  She is tolerating Ozempic well with no side effects.  Will plan to change Ozempic 1mg  dosing to [facility] 1.7mg  dosing with option to increase to full 2.4mg  if tolerated after 4 weeks.  7 days after last Ozempic dosage start Wegovy and STOP Ozempic.  Monitor for side effects and let me know if any present.  Medication sent to pharmacy- will likely need prior authorization for Diabetes and Weight Management.   Follow-up in 6 months or sooner if needed for DM

## 2021-02-17 NOTE — Assessment & Plan Note (Addendum)
Constant RUQ pain with intermittent nausea and rare vomiting with intermittent "greasy" stools. Unknown etiology despite extensive GI workup in 2017. Tenderness noted on palpation to RUQ and epigastric region with deep palpation. Liver palpated WNL. Currently on PPI therapy.   Plan to review previous labs results to determine what has been evaluated in the past. Consider possibility of uncontrolled gastric ulcer, HPylori, gall bladder, pancreatic etiology based on presentation and symptoms. Labs today for CBC, CMP, Lipids.   Continue PPI at current dosage.  Will determine follow-up plan after chart review.  Cerullo consider treatment for H.Pylori if labs normal. She is due for repeat colonoscopy- recommended.  If symptoms worsen with increased semaglutide [facility]) use, let me know.  Follow-up if symptoms worsen or fail to improve.

## 2021-02-18 ENCOUNTER — Telehealth (HOSPITAL_BASED_OUTPATIENT_CLINIC_OR_DEPARTMENT_OTHER): Payer: Self-pay

## 2021-02-18 LAB — THYROID PANEL WITH TSH
Free Thyroxine Index: 2.3 (ref 1.2–4.9)
T3 Uptake Ratio: 28 % (ref 24–39)
T4, Total: 8.2 ug/dL (ref 4.5–12.0)
TSH: 1.45 u[IU]/mL (ref 0.450–4.500)

## 2021-02-18 NOTE — Telephone Encounter (Signed)
-----   Message from Orma Render, NP sent at 02/17/2021  6:18 PM EDT ----- Triglycerides slightly elevated with near optimal LDL. Recommend decreasing saturated fats and carbohydrates in diet along with increased physical activity. Will repeat labs in 6 months for re-evaluation.  A1c is down to 6.2!! Plan to continue with increased dose of semaglutide and will follow-up in 3-6 months.   BUN shows slight elevation, fairly consistent with previous results. Creatinine on high end of normal. Avoid NSAIDS and medications known to be hard on the kidneys.   CBC excellent.  Awaiting TSH results.

## 2021-02-18 NOTE — Progress Notes (Signed)
Thyroid looks good. No changes to the plan of care at this time.

## 2021-02-18 NOTE — Progress Notes (Signed)
X-ray consistent with distant sprain and spurring to the heal and mid foot. Recommend compression sleeve for the ankle for added stability, especially when working. Plan to mail ankle exercises to patient. Icing, compression, and rest will allow for faster healing of ligaments/sprain.

## 2021-02-18 NOTE — Telephone Encounter (Signed)
Results released by Sarabeth Early, AGNP and reviewed by patient via MyChart. Instructed patient to contact the office with any questions or concerns.  

## 2021-02-20 ENCOUNTER — Telehealth (HOSPITAL_BASED_OUTPATIENT_CLINIC_OR_DEPARTMENT_OTHER): Payer: Self-pay

## 2021-02-20 ENCOUNTER — Encounter (HOSPITAL_BASED_OUTPATIENT_CLINIC_OR_DEPARTMENT_OTHER): Payer: Self-pay | Admitting: Nurse Practitioner

## 2021-02-20 NOTE — Telephone Encounter (Signed)
-----   Message from Orma Render, NP sent at 02/18/2021  5:29 PM EDT ----- X-ray consistent with distant sprain and spurring to the heal and mid foot. Recommend compression sleeve for the ankle for added stability, especially when working. Plan to mail ankle exercises to patient. Icing, compression, and rest will allow for faster healing of ligaments/sprain.

## 2021-02-20 NOTE — Telephone Encounter (Signed)
Results released by Worthy Keeler, AGNP and reviewed by patient via Victor patient to contact the office with any questions or concerns.

## 2021-02-27 ENCOUNTER — Other Ambulatory Visit (HOSPITAL_BASED_OUTPATIENT_CLINIC_OR_DEPARTMENT_OTHER): Payer: Self-pay | Admitting: Nurse Practitioner

## 2021-02-27 DIAGNOSIS — M792 Neuralgia and neuritis, unspecified: Secondary | ICD-10-CM

## 2021-02-27 MED ORDER — GABAPENTIN 100 MG PO CAPS: ORAL_CAPSULE | ORAL | 0 refills | Status: DC

## 2021-02-28 ENCOUNTER — Telehealth (HOSPITAL_BASED_OUTPATIENT_CLINIC_OR_DEPARTMENT_OTHER): Payer: Self-pay | Admitting: Nurse Practitioner

## 2021-02-28 NOTE — Telephone Encounter (Signed)
Notification sent to patient that [facility] prior authorization approved. Will plan to start on 1.7mg  dose per week and move up to 2.4mg  in 4 weeks if patient tolerates well.

## 2021-03-01 ENCOUNTER — Other Ambulatory Visit (HOSPITAL_COMMUNITY): Payer: Self-pay

## 2021-03-01 MED FILL — Gabapentin Cap 100 MG: ORAL | 30 days supply | Qty: 152 | Fill #0 | Status: AC

## 2021-03-03 ENCOUNTER — Other Ambulatory Visit (HOSPITAL_BASED_OUTPATIENT_CLINIC_OR_DEPARTMENT_OTHER): Payer: Self-pay

## 2021-03-04 ENCOUNTER — Other Ambulatory Visit (HOSPITAL_COMMUNITY): Payer: Self-pay

## 2021-03-04 MED FILL — Semaglutide (Weight Mngmt) Soln Auto-Injector 1.7 MG/0.75ML: SUBCUTANEOUS | 28 days supply | Qty: 3 | Fill #0 | Status: AC

## 2021-03-05 ENCOUNTER — Encounter (HOSPITAL_BASED_OUTPATIENT_CLINIC_OR_DEPARTMENT_OTHER): Payer: Self-pay

## 2021-03-05 ENCOUNTER — Ambulatory Visit (HOSPITAL_BASED_OUTPATIENT_CLINIC_OR_DEPARTMENT_OTHER): Payer: 59 | Admitting: Physical Therapy

## 2021-03-06 ENCOUNTER — Other Ambulatory Visit: Payer: Self-pay

## 2021-03-06 ENCOUNTER — Other Ambulatory Visit (HOSPITAL_COMMUNITY): Payer: Self-pay

## 2021-03-06 ENCOUNTER — Encounter: Payer: Self-pay | Admitting: Cardiology

## 2021-03-06 ENCOUNTER — Ambulatory Visit: Payer: 59 | Admitting: Cardiology

## 2021-03-06 DIAGNOSIS — R072 Precordial pain: Secondary | ICD-10-CM

## 2021-03-06 DIAGNOSIS — I10 Essential (primary) hypertension: Secondary | ICD-10-CM

## 2021-03-06 DIAGNOSIS — E7800 Pure hypercholesterolemia, unspecified: Secondary | ICD-10-CM | POA: Diagnosis not present

## 2021-03-06 MED ORDER — ROSUVASTATIN CALCIUM 20 MG PO TABS
10.0000 mg | ORAL_TABLET | Freq: Every day | ORAL | 5 refills | Status: DC
  Filled 2021-03-06: qty 30, 60d supply, fill #0

## 2021-03-06 NOTE — Progress Notes (Signed)
Cardiology Office Note:    Date:  03/06/2021   ID:  Michelle Ray, DOB [birth year], MRN [id number]  PCP:  Orma Render, NP  [facility] Cardiologist:  Kate Sable, MD  [facility] Electrophysiologist:  None   Referring MD: Philmore Pali, NP   Chief Complaint  Patient presents with  . Other    Follow up post ECHO and CT. Meds reviewed verbally with patient.     History of Present Illness:    Michelle Ray is a 55 y.o. female with a hx of hypertension, hyperlipidemia, former smoker x30 years who presents for follow-up.  Patient was last seen due to chest pain.  Due to symptoms and risk factors, echocardiogram and coronary CTA was ordered to evaluate presence of CAD and also cardiac function.  She denies any further episodes of chest pain since her last clinic visit.  Feels well otherwise.  Prior notes  has a history of early MI in her father at age 62.    Past Medical History:  Diagnosis Date  . Allergy   . Anemia   . Aortic atherosclerosis ([place])   . Benign secondary hypertension due to renal artery stenosis (HCC)   . Constipation    uses OTC stool softener prn only- this is not chronic  . Diabetes mellitus without complication ([place])   . GERD (gastroesophageal reflux disease)   . Hyperlipemia   . Hyperlipidemia   . Hypertension     Past Surgical History:  Procedure Laterality Date  . ABLATION    . TUBAL LIGATION    . uterine fibroid removal    . WISDOM TOOTH EXTRACTION      Current Medications: Current Meds  Medication Sig  . AMBULATORY NON FORMULARY MEDICATION IBgard Take 1-2 capsules by mouth three times daily as needed.  Marland Kitchen amLODipine (NORVASC) 10 MG tablet TAKE 1 TABLET BY MOUTH ONCE A DAY  . benazepril (LOTENSIN) 20 MG tablet TAKE 1 TABLET BY MOUTH ONCE A DAY  . esomeprazole (NEXIUM) 40 MG capsule TAKE 1 CAPSULE BY MOUTH ONCE A DAY  . furosemide (LASIX) 40 MG tablet TAKE 1 TABLET BY MOUTH ONCE A DAY  . gabapentin (NEURONTIN) 100 MG capsule TAKE 1 CAPSULE  BY MOUTH 2 TIMES DAILY AND 3 CAPSULES EVERY NIGHT AT BEDTIME  . omeprazole (PRILOSEC) 40 MG capsule Take 40 mg by mouth daily.  . ondansetron (ZOFRAN) 4 MG tablet TAKE 1 TABLET BY MOUTH EVERY 6 TO 8 HOURS AS NEEDED  . Semaglutide-Weight Management 1.7 MG/0.75ML SOAJ INJECT 1.7 MG INTO THE SKIN ONCE A WEEK.  . triamterene-hydrochlorothiazide (MAXZIDE-25) 37.5-25 MG tablet TAKE 1 TABLET BY MOUTH ONCE A DAY  . [DISCONTINUED] rosuvastatin (CRESTOR) 10 MG tablet Take 10 mg by mouth daily.     Allergies:   Macrobid [nitrofurantoin] and Pravastatin   Social History   Socioeconomic History  . Marital status: Married    Spouse name: Randall Hiss  . Number of children: Not on file  . Years of education: Not on file  . Highest education level: Master's degree (e.g., MA, MS, MEng, MEd, MSW, MBA)  Occupational History  . 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 Smoking status: Former Research scientist (life sciences)  . Smokeless tobacco: Never Used  Vaping Use  . Vaping Use: Never used  Substance and Sexual Activity  . Alcohol use: No  . Drug use: No  . Sexual activity: Yes    Partners: Male  Birth control/protection: Post-menopausal  Other Topics Concern  . 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 Married to [place]. Adult children.    Social Determinants of Health   Financial Resource Strain: Not on file  Food Insecurity: Not on file  Transportation Needs: Not on file  Physical Activity: Not on file  Stress: Not on file  Social Connections: Not on file     Family History: The patient's family history includes Atrial fibrillation in her mother; Breast cancer in her mother; Heart disease in her maternal grandfather; Heart failure in her sister; Hypertension in her maternal grandmother; Pulmonary embolism in her mother; Stroke in her paternal grandfather. There is no history of Colon cancer or Colon polyps.  ROS:   Please see the history of  present illness.     All other systems reviewed and are negative.  EKGs/Labs/Other Studies Reviewed:    The following studies were reviewed today:   EKG:  EKG is  ordered today.  The ekg ordered today demonstrates normal sinus rhythm.  Recent Labs: 02/17/2021: ALT 18; BUN 27; Creatinine, Ser 0.97; Hemoglobin 13.7; Platelets 319; Potassium 3.5; Sodium 141; TSH 1.450  Recent Lipid Panel    Component Value Date/Time   CHOL 184 02/17/2021 1120   TRIG 180 (H) 02/17/2021 1120   HDL 46 02/17/2021 1120   CHOLHDL 4.0 02/17/2021 1120   VLDL 36 02/17/2021 1120   LDLCALC 102 (H) 02/17/2021 1120     Risk Assessment/Calculations:     Physical Exam:    VS:  BP 110/76 (BP Location: Left Arm, Patient Position: Sitting, Cuff Size: Normal)   Pulse 66   Ht 5\' 3"  (1.6 m)   Wt 242 lb (109.8 kg)   SpO2 96%   BMI 42.87 kg/m     Wt Readings from Last 3 Encounters:  03/06/21 242 lb (109.8 kg)  02/17/21 244 lb 3.2 oz (110.8 kg)  12/10/20 249 lb (112.9 kg)     GEN:  Well nourished, well developed in no acute distress HEENT: Normal NECK: No JVD; No carotid bruits LYMPHATICS: No lymphadenopathy CARDIAC: RRR, no murmurs, rubs, gallops RESPIRATORY:  Clear to auscultation without rales, wheezing or rhonchi  ABDOMEN: Soft, non-tender, non-distended MUSCULOSKELETAL:  No edema; No deformity  SKIN: Warm and dry NEUROLOGIC:  Alert and oriented x 3 PSYCHIATRIC:  Normal affect   ASSESSMENT:    1. Precordial pain   2. Primary hypertension   3. Pure hypercholesterolemia    PLAN:    In order of problems listed above:  1. Prior chest pain, currently resolved.  Risk factors of hypertension, hyperlipidemia, family history of early CAD, former smoker.  Echocardiogram showed normal systolic function, EF 60 to 65%, no wall motion abnormalities.  Coronary CTA showed a calcium score of 25.7, minimal calcification in the mid LAD causing less than 25% stenosis.  No obstructive CAD noted.  Patient made  aware of results and reassured. 2. Hypertension, BP controlled.  Continue amlodipine, benazepril. 3. Hyperlipidemia, cholesterol not controlled, increase Crestor to 20 mg daily.  Repeat fasting lipid profile in 6 months.  Follow-up in 6 months after repeat fasting lipid profile.    Medication Adjustments/Labs and Tests Ordered: Current medicines are reviewed at length with the patient today.  Concerns regarding medicines are outlined above.  Orders Placed This Encounter  Procedures  . Lipid panel  . EKG 12-Lead   Meds ordered this encounter  Medications  . rosuvastatin (CRESTOR) 20 MG tablet  Sig: Take 0.5 tablets (10 mg total) by mouth daily.    Dispense:  30 tablet    Refill:  5    Patient Instructions  Medication Instructions:   Your physician has recommended you make the following change in your medication:   INCREASE your Crestor to 20 MG daily.   *If you need a refill on your cardiac medications before your next appointment, please call your pharmacy*   Lab Work:  Your physician recommends that you return for a FASTING lipid profile: in 6 months just prior to your follow up. - You will need to be fasting. Please do not have anything to eat or drink after midnight the morning you have the lab work. You [place]qu[place] only have water or black coffee with no cream or sugar. - You will need to be fasting. Please do not have anything to eat or drink after midnight the morning you have the lab work. You Fahr only have water or black coffee with no cream or sugar.   Testing/Procedures: None ordered   Follow-Up: At [facility], you and your health needs are our priority.  As part of our continuing mission to provide you with exceptional heart care, we have created designated Provider Care Teams.  These Care Teams include your primary Cardiologist (physician) and Advanced Practice Providers (APPs -  Physician Assistants and Nurse Practitioners) who all work together to provide you  with the care you need, when you need it.  We recommend signing up for the patient portal called "MyChart".  Sign up information is provided on this After Visit Summary.  MyChart is used to connect with patients for Virtual Visits (Telemedicine).  Patients are able to view lab/test results, encounter notes, upcoming appointments, etc.  Non-urgent messages can be sent to your provider as well.   To learn more about what you can do with MyChart, go to [email or web address].    Your next appointment:   6 month(s)  The format for your next appointment:   In Person  Provider:   Kate Sable, MD   Other Instructions     Signed, Kate Sable, MD  03/06/2021 12:29 PM    [place]

## 2021-03-06 NOTE — Patient Instructions (Signed)
Medication Instructions:   Your physician has recommended you make the following change in your medication:   INCREASE your Crestor to 20 MG daily.   *If you need a refill on your cardiac medications before your next appointment, please call your pharmacy*   Lab Work:  Your physician recommends that you return for a FASTING lipid profile: in 6 months just prior to your follow up. - You will need to be fasting. Please do not have anything to eat or drink after midnight the morning you have the lab work. You Creasy only have water or black coffee with no cream or sugar. - You will need to be fasting. Please do not have anything to eat or drink after midnight the morning you have the lab work. You Miltner only have water or black coffee with no cream or sugar.   Testing/Procedures: None ordered   Follow-Up: At [facility], you and your health needs are our priority.  As part of our continuing mission to provide you with exceptional heart care, we have created designated Provider Care Teams.  These Care Teams include your primary Cardiologist (physician) and Advanced Practice Providers (APPs -  Physician Assistants and Nurse Practitioners) who all work together to provide you with the care you need, when you need it.  We recommend signing up for the patient portal called "MyChart".  Sign up information is provided on this After Visit Summary.  MyChart is used to connect with patients for Virtual Visits (Telemedicine).  Patients are able to view lab/test results, encounter notes, upcoming appointments, etc.  Non-urgent messages can be sent to your provider as well.   To learn more about what you can do with MyChart, go to [email or web address].    Your next appointment:   6 month(s)  The format for your next appointment:   In Person  Provider:   Kate Sable, MD   Other Instructions

## 2021-03-10 ENCOUNTER — Other Ambulatory Visit: Payer: Self-pay

## 2021-03-10 DIAGNOSIS — I83893 Varicose veins of bilateral lower extremities with other complications: Secondary | ICD-10-CM

## 2021-03-14 ENCOUNTER — Other Ambulatory Visit (HOSPITAL_COMMUNITY): Payer: Self-pay

## 2021-03-19 ENCOUNTER — Encounter (HOSPITAL_BASED_OUTPATIENT_CLINIC_OR_DEPARTMENT_OTHER): Payer: Self-pay | Admitting: Nurse Practitioner

## 2021-03-19 ENCOUNTER — Other Ambulatory Visit (HOSPITAL_COMMUNITY): Payer: Self-pay

## 2021-03-19 MED ORDER — EZETIMIBE 10 MG PO TABS
10.0000 mg | ORAL_TABLET | Freq: Every day | ORAL | 1 refills | Status: DC
  Filled 2021-03-19: qty 90, 90d supply, fill #0

## 2021-03-21 ENCOUNTER — Other Ambulatory Visit (HOSPITAL_COMMUNITY): Payer: Self-pay

## 2021-03-28 ENCOUNTER — Other Ambulatory Visit (HOSPITAL_BASED_OUTPATIENT_CLINIC_OR_DEPARTMENT_OTHER): Payer: Self-pay | Admitting: Nurse Practitioner

## 2021-03-28 DIAGNOSIS — Z6841 Body Mass Index (BMI) 40.0 and over, adult: Secondary | ICD-10-CM

## 2021-03-28 DIAGNOSIS — M792 Neuralgia and neuritis, unspecified: Secondary | ICD-10-CM

## 2021-03-28 DIAGNOSIS — E782 Mixed hyperlipidemia: Secondary | ICD-10-CM

## 2021-03-28 DIAGNOSIS — I700 Atherosclerosis of aorta: Secondary | ICD-10-CM

## 2021-03-28 DIAGNOSIS — E118 Type 2 diabetes mellitus with unspecified complications: Secondary | ICD-10-CM

## 2021-03-28 DIAGNOSIS — I10 Essential (primary) hypertension: Secondary | ICD-10-CM

## 2021-03-28 MED ORDER — CITALOPRAM HYDROBROMIDE 20 MG PO TABS
20.0000 mg | ORAL_TABLET | Freq: Every day | ORAL | 3 refills | Status: DC
  Filled 2021-03-28: qty 30, 30d supply, fill #0

## 2021-03-28 MED ORDER — SEMAGLUTIDE-WEIGHT MANAGEMENT 2.4 MG/0.75ML ~~LOC~~ SOAJ
2.4000 mg | SUBCUTANEOUS | 6 refills | Status: DC
  Filled 2021-03-28: qty 3, 28d supply, fill #0

## 2021-03-29 ENCOUNTER — Other Ambulatory Visit (HOSPITAL_COMMUNITY): Payer: Self-pay

## 2021-04-01 ENCOUNTER — Ambulatory Visit: Payer: 59 | Admitting: Physician Assistant

## 2021-04-01 ENCOUNTER — Other Ambulatory Visit: Payer: Self-pay

## 2021-04-01 ENCOUNTER — Ambulatory Visit (HOSPITAL_COMMUNITY)
Admission: RE | Admit: 2021-04-01 | Discharge: 2021-04-01 | Disposition: A | Discharge status: Home / Self Care | Payer: 59 | Source: Ambulatory Visit | Attending: Vascular Surgery | Admitting: Vascular Surgery

## 2021-04-01 VITALS — BP 94/61 | HR 69 | Temp 98.0°F | Resp 20 | Ht 63.0 in | Wt 238.4 lb

## 2021-04-01 DIAGNOSIS — I872 Venous insufficiency (chronic) (peripheral): Secondary | ICD-10-CM | POA: Diagnosis not present

## 2021-04-01 DIAGNOSIS — I83893 Varicose veins of bilateral lower extremities with other complications: Secondary | ICD-10-CM | POA: Diagnosis not present

## 2021-04-02 ENCOUNTER — Encounter: Payer: Self-pay | Admitting: Physician Assistant

## 2021-04-02 NOTE — Progress Notes (Signed)
Requested by:  Orma Render, NP [place],  [place] [id number]  Reason for consultation: Evaluation of RLE edema and painful varicose veins    History of Present Illness   Michelle Ray is a 55 y.o. ([birth year]) female who presents for evaluation of right leg edema and painful varicose veins  Venous symptoms include: aching, heavy, tired, swelling, and pain which is worse at the end of the day Onset/duration:  Varicose veins first noticed during pregnancy 30 years ago; over the past year the pain and edema has worsened  Occupation:  Therapist, sports Aggravating factors: sitting, standing for long periods of time Alleviating factors: elevation Compression:  Knee high compression daily which helps alleviate some of the pain Pain medications:  NSAIDs Previous vein procedures:  None  History of DVT:  None  Past Medical History:  Diagnosis Date  . Allergy   . Anemia   . Aortic atherosclerosis ([place])   . Benign secondary hypertension due to renal artery stenosis (HCC)   . Constipation    uses OTC stool softener prn only- this is not chronic  . Diabetes mellitus without complication ([place])   . GERD (gastroesophageal reflux disease)   . Hyperlipemia   . Hyperlipidemia   . Hypertension     Past Surgical History:  Procedure Laterality Date  . ABLATION    . TUBAL LIGATION    . uterine fibroid removal    . WISDOM TOOTH EXTRACTION      Social History   Socioeconomic History  . Marital status: Married    Spouse name: Randall Hiss  . Number of children: Not on file  . Years of education: Not on file  . Highest education level: Master's degree (e.g., MA, MS, MEng, MEd, MSW, MBA)  Occupational History  . 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 Smoking status: Former Research scientist (life sciences)  . Smokeless tobacco: Never Used  Vaping Use  . Vaping Use: Never used  Substance and Sexual Activity  . Alcohol use: No  . Drug use: No  . Sexual activity: Yes     Partners: Male    Birth control/protection: Post-menopausal  Other Topics Concern  . 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 Married to Many. Adult children.    Social Determinants of Health   Financial Resource Strain: Not on file  Food Insecurity: Not on file  Transportation Needs: Not on file  Physical Activity: Not on file  Stress: Not on file  Social Connections: Not on file  Intimate Partner Violence: Not on file    Family History  Problem Relation Age of Onset  . Atrial fibrillation Mother   . Pulmonary embolism Mother   . Breast cancer Mother   . Heart failure Sister   . Hypertension Maternal Grandmother   . Heart disease Maternal Grandfather   . Stroke Paternal Grandfather   . Colon cancer Neg Hx   . Colon polyps Neg Hx     Current Outpatient Medications  Medication Sig Dispense Refill  . AMBULATORY NON FORMULARY MEDICATION IBgard Take 1-2 capsules by mouth three times daily as needed. 12 capsule 0  . amLODipine (NORVASC) 10 MG tablet TAKE 1 TABLET BY MOUTH ONCE A DAY 90 tablet 2  . benazepril (LOTENSIN) 20 MG tablet TAKE 1 TABLET BY MOUTH ONCE A DAY 30 tablet 4  . citalopram (CELEXA) 20 MG tablet Start with  1/2 tablet (10mg ) by mouth at bedtime for 6 days then increase to full tablet (20mg ) by mouth daily. 30 tablet 3  . esomeprazole (NEXIUM) 40 MG capsule TAKE 1 CAPSULE BY MOUTH ONCE A DAY 420 capsule 0  . ezetimibe (ZETIA) 10 MG tablet Take 1 tablet (10 mg total) by mouth daily. 90 tablet 1  . furosemide (LASIX) 40 MG tablet TAKE 1 TABLET BY MOUTH ONCE A DAY 30 tablet 4  . omeprazole (PRILOSEC) 40 MG capsule Take 40 mg by mouth daily.    . ondansetron (ZOFRAN) 4 MG tablet TAKE 1 TABLET BY MOUTH EVERY 6 TO 8 HOURS AS NEEDED 30 tablet 0  . Semaglutide-Weight Management 2.4 MG/0.75ML SOAJ Inject 2.4 mg into the skin once a week. 3 mL 6  . triamterene-hydrochlorothiazide (MAXZIDE-25) 37.5-25 MG tablet TAKE 1 TABLET BY  MOUTH ONCE A DAY 30 tablet 4   No current facility-administered medications for this visit.    Allergies  Allergen Reactions  . Macrobid [Nitrofurantoin]   . Pravastatin     Joint pain    REVIEW OF SYSTEMS (negative unless checked):   Cardiac:  []  Chest pain or chest pressure? []  Shortness of breath upon activity? []  Shortness of breath when lying flat? []  Irregular heart rhythm?  Vascular:  []  Pain in calf, thigh, or hip brought on by walking? []  Pain in feet at night that wakes you up from your sleep? []  Blood clot in your veins? []  Leg swelling?  Pulmonary:  []  Oxygen at home? []  Productive cough? []  Wheezing?  Neurologic:  []  Sudden weakness in arms or legs? []  Sudden numbness in arms or legs? []  Sudden onset of difficult speaking or slurred speech? []  Temporary loss of vision in one eye? []  Problems with dizziness?  Gastrointestinal:  []  Blood in stool? []  Vomited blood?  Genitourinary:  []  Burning when urinating? []  Blood in urine?  Psychiatric:  []  Major depression  Hematologic:  []  Bleeding problems? []  Problems with blood clotting?  Dermatologic:  []  Rashes or ulcers?  Constitutional:  []  Fever or chills?  Ear/Nose/Throat:  []  Change in hearing? []  Nose bleeds? []  Sore throat?  Musculoskeletal:  []  Back pain? []  Joint pain? []  Muscle pain?   Physical Examination     Vitals:   04/01/21 1343  BP: 94/61  Pulse: 69  Resp: 20  Temp: 98 F (36.7 C)  TempSrc: Temporal  SpO2: 98%  Weight: 238 lb 6.4 oz (108.1 kg)  Height: 5\' 3"  (1.6 m)   Body mass index is 42.23 kg/m.  General:  WDWN in NAD; vital signs documented above Gait: Not observed HENT: WNL, normocephalic Pulmonary: normal non-labored breathing Cardiac: regular HR Abdomen: soft, NT, no masses Skin: without rashes Vascular Exam/Pulses:  Right Left  DP 2+ (normal) 2+ (normal)   Extremities: No significant edema on exam; scattered spider veins; ropey varicose  veins along path of GSV in R lower leg Musculoskeletal: no muscle wasting or atrophy  Neurologic: A&O X 3;  No focal weakness or paresthesias are detected Psychiatric:  The pt has Normal affect.  Non-invasive Vascular Imaging   BLE Venous Insufficiency Duplex :   RLE:   Negative for DVT and SVT,   GSV reflux from SFJ to knee,  GSV diameter >79mm  SSV reflux in mid calf,  deep venous reflux R CFV    Medical Decision Making   Michelle Ray is a 55 y.o. 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 Scot Dock in 3 months to see if she would be a candidate for laser ablation therapy and stab phlebectomy   Dagoberto Ligas, PA-C [facility] Office: [phone]  04/02/2021, 8:57 AM  Clinic MD: Carlis Abbott

## 2021-04-03 ENCOUNTER — Encounter (HOSPITAL_BASED_OUTPATIENT_CLINIC_OR_DEPARTMENT_OTHER): Payer: Self-pay | Admitting: Radiology

## 2021-04-03 ENCOUNTER — Other Ambulatory Visit: Payer: Self-pay

## 2021-04-03 ENCOUNTER — Ambulatory Visit (HOSPITAL_BASED_OUTPATIENT_CLINIC_OR_DEPARTMENT_OTHER)
Admission: RE | Admit: 2021-04-03 | Discharge: 2021-04-03 | Disposition: A | Discharge status: Home / Self Care | Payer: 59 | Source: Ambulatory Visit | Attending: Nurse Practitioner | Admitting: Nurse Practitioner

## 2021-04-03 DIAGNOSIS — Z1231 Encounter for screening mammogram for malignant neoplasm of breast: Secondary | ICD-10-CM | POA: Insufficient documentation

## 2021-04-14 ENCOUNTER — Other Ambulatory Visit (HOSPITAL_COMMUNITY): Payer: Self-pay

## 2021-04-14 MED FILL — Esomeprazole Magnesium Cap Delayed Release 40 MG (Base Eq): ORAL | 90 days supply | Qty: 90 | Fill #0 | Status: AC

## 2021-04-16 ENCOUNTER — Other Ambulatory Visit (HOSPITAL_COMMUNITY): Payer: Self-pay

## 2021-04-24 ENCOUNTER — Other Ambulatory Visit (HOSPITAL_COMMUNITY): Payer: Self-pay

## 2021-04-24 ENCOUNTER — Encounter (HOSPITAL_BASED_OUTPATIENT_CLINIC_OR_DEPARTMENT_OTHER): Payer: Self-pay | Admitting: Nurse Practitioner

## 2021-04-24 ENCOUNTER — Other Ambulatory Visit: Payer: Self-pay

## 2021-04-24 ENCOUNTER — Ambulatory Visit (HOSPITAL_BASED_OUTPATIENT_CLINIC_OR_DEPARTMENT_OTHER): Payer: 59 | Admitting: Nurse Practitioner

## 2021-04-24 VITALS — BP 115/71 | HR 68 | Ht 63.0 in | Wt 235.2 lb

## 2021-04-24 DIAGNOSIS — Z6841 Body Mass Index (BMI) 40.0 and over, adult: Secondary | ICD-10-CM

## 2021-04-24 DIAGNOSIS — I700 Atherosclerosis of aorta: Secondary | ICD-10-CM

## 2021-04-24 DIAGNOSIS — I10 Essential (primary) hypertension: Secondary | ICD-10-CM

## 2021-04-24 DIAGNOSIS — E782 Mixed hyperlipidemia: Secondary | ICD-10-CM

## 2021-04-24 DIAGNOSIS — M109 Gout, unspecified: Secondary | ICD-10-CM

## 2021-04-24 DIAGNOSIS — E118 Type 2 diabetes mellitus with unspecified complications: Secondary | ICD-10-CM | POA: Diagnosis not present

## 2021-04-24 MED ORDER — BENAZEPRIL HCL 20 MG PO TABS
ORAL_TABLET | Freq: Every day | ORAL | 1 refills | Status: DC
  Filled 2021-04-24: qty 90, 90d supply, fill #0
  Filled 2021-11-15: qty 90, 90d supply, fill #1

## 2021-04-24 MED ORDER — CITALOPRAM HYDROBROMIDE 20 MG PO TABS
20.0000 mg | ORAL_TABLET | Freq: Every day | ORAL | 1 refills | Status: DC
  Filled 2021-04-24: qty 90, 90d supply, fill #0
  Filled 2021-08-20: qty 90, 90d supply, fill #1

## 2021-04-24 MED ORDER — DEXAMETHASONE SODIUM PHOSPHATE 10 MG/ML IJ SOLN
10.0000 mg | Freq: Once | INTRAMUSCULAR | Status: AC
Start: 2021-04-24 — End: 2021-04-24
  Administered 2021-04-24: 10 mg via INTRAMUSCULAR

## 2021-04-24 MED ORDER — TRIAMTERENE-HCTZ 37.5-25 MG PO TABS
1.0000 | ORAL_TABLET | Freq: Every day | ORAL | 1 refills | Status: DC
  Filled 2021-04-24: qty 90, 90d supply, fill #0
  Filled 2021-11-15: qty 90, 90d supply, fill #1

## 2021-04-24 MED ORDER — SEMAGLUTIDE-WEIGHT MANAGEMENT 2.4 MG/0.75ML ~~LOC~~ SOAJ
2.4000 mg | SUBCUTANEOUS | 6 refills | Status: DC
Start: 2021-04-24 — End: 2022-01-11
  Filled 2021-04-24: qty 3, 28d supply, fill #0
  Filled 2021-06-03: qty 3, 28d supply, fill #1
  Filled 2021-06-30: qty 3, 28d supply, fill #2
  Filled 2021-08-11: qty 3, 28d supply, fill #3
  Filled 2021-09-08: qty 3, 28d supply, fill #4
  Filled 2021-10-19: qty 3, 28d supply, fill #5
  Filled 2021-11-15: qty 3, 28d supply, fill #6

## 2021-04-24 MED ORDER — KETOROLAC TROMETHAMINE 60 MG/2ML IM SOLN
60.0000 mg | Freq: Once | INTRAMUSCULAR | Status: AC
Start: 2021-04-24 — End: 2021-04-24
  Administered 2021-04-24: 60 mg via INTRAMUSCULAR

## 2021-04-24 MED ORDER — ESOMEPRAZOLE MAGNESIUM 40 MG PO CPDR
DELAYED_RELEASE_CAPSULE | Freq: Every day | ORAL | 1 refills | Status: DC
  Filled 2021-04-24 – 2021-07-28 (×2): qty 90, 90d supply, fill #0
  Filled 2021-10-19: qty 90, 90d supply, fill #1

## 2021-04-24 MED ORDER — EZETIMIBE 10 MG PO TABS
10.0000 mg | ORAL_TABLET | Freq: Every day | ORAL | 1 refills | Status: DC
  Filled 2021-04-24 – 2021-07-28 (×2): qty 90, 90d supply, fill #0
  Filled 2021-11-15: qty 90, 90d supply, fill #1

## 2021-04-24 MED ORDER — FUROSEMIDE 40 MG PO TABS
ORAL_TABLET | Freq: Every day | ORAL | 1 refills | Status: DC
  Filled 2021-04-24: qty 90, 90d supply, fill #0
  Filled 2021-11-15: qty 90, 90d supply, fill #1

## 2021-04-24 MED ORDER — AMLODIPINE BESYLATE 10 MG PO TABS
ORAL_TABLET | Freq: Every day | ORAL | 1 refills | Status: DC
  Filled 2021-04-24: qty 90, 90d supply, fill #0
  Filled 2021-08-22: qty 90, 90d supply, fill #1

## 2021-04-24 MED ORDER — PREDNISONE 10 MG (21) PO TBPK
ORAL_TABLET | ORAL | 1 refills | Status: DC
  Filled 2021-04-24: qty 21, 6d supply, fill #0
  Filled 2021-06-03: qty 21, 6d supply, fill #1

## 2021-04-24 NOTE — Patient Instructions (Signed)
Gout  Gout is a condition that causes painful swelling of the joints. Gout is a type of inflammation of the joints (arthritis). This condition is caused by having too much uric acid in the body. Uric acid is a chemical that forms when the body breaks down substances called purines. Purines are important for building body proteins. When the body has too much uric acid, sharp crystals can form and build up inside the joints. This causes pain and swelling. Gout attacks can happen quickly and [place] be very painful (acute gout). Over time, the attacks can affect more joints and become more frequent (chronic gout). Gout can also cause uric acid to build up under the skin and inside the kidneys. What are the causes? This condition is caused by too much uric acid in your blood. This can happen because:  Your kidneys do not remove enough uric acid from your blood. This is the most common cause.  Your body makes too much uric acid. This can happen with some cancers and cancer treatments. It can also occur if your body is breaking down too many red blood cells (hemolytic anemia).  You eat too many foods that are high in purines. These foods include organ meats and some seafood. Alcohol, especially beer, is also high in purines. A gout attack [place] be triggered by trauma or stress. What increases the risk? You are more likely to develop this condition if you:  Have a family history of gout.  Are female and middle-aged.  Are female and have gone through menopause.  Are obese.  Frequently drink alcohol, especially beer.  Are dehydrated.  Lose weight too quickly.  Have an organ transplant.  Have lead poisoning.  Take certain medicines, including aspirin, cyclosporine, diuretics, levodopa, and niacin.  Have kidney disease.  Have a skin condition called psoriasis. What are the signs or symptoms? An attack of acute gout happens quickly. It usually occurs in just one joint. The most common place is  the big toe. Attacks often start at night. Other joints that [place] be affected include joints of the feet, ankle, knee, fingers, wrist, or elbow. Symptoms of this condition [place] include:  Severe pain.  Warmth.  Swelling.  Stiffness.  Tenderness. The affected joint [place] be very painful to touch.  Shiny, red, or purple skin.  Chills and fever. Chronic gout [place] cause symptoms more frequently. More joints [place] be involved. You [place] also have white or yellow lumps (tophi) on your hands or feet or in other areas near your joints.   How is this diagnosed? This condition is diagnosed based on your symptoms, medical history, and physical exam. You [place] have tests, such as:  Blood tests to measure uric acid levels.  Removal of joint fluid with a thin needle (aspiration) to look for uric acid crystals.  X-rays to look for joint damage. How is this treated? Treatment for this condition has two phases: treating an acute attack and preventing future attacks. Acute gout treatment [place] include medicines to reduce pain and swelling, including:  NSAIDs.  Steroids. These are strong anti-inflammatory medicines that can be taken by mouth (orally) or injected into a joint.  Colchicine. This medicine relieves pain and swelling when it is taken soon after an attack. It can be given by mouth or through an IV. Preventive treatment [place] include:  Daily use of smaller doses of NSAIDs or colchicine.  Use of a medicine that reduces uric acid levels in your blood.  Changes to your diet.   You [place] need to see a dietitian about what to eat and drink to prevent gout. Follow these instructions at home: During a gout attack  If directed, put ice on the affected area: ? Put ice in a plastic bag. ? Place a towel between your skin and the bag. ? Leave the ice on for 20 minutes, 2-3 times a day.  Raise (elevate) the affected joint above the level of your heart as often as possible.  Rest the joint as much as possible.  If the affected joint is in your leg, you Ganas be given crutches to use.  Follow instructions from your health care provider about eating or drinking restrictions.   Avoiding future gout attacks  Follow a low-purine diet as told by your dietitian or health care provider. Avoid foods and drinks that are high in purines, including liver, kidney, anchovies, asparagus, herring, mushrooms, mussels, and beer.  Maintain a healthy weight or lose weight if you are overweight. If you want to lose weight, talk with your health care provider. It is important that you do not lose weight too quickly.  Start or maintain an exercise program as told by your health care provider. Eating and drinking  Drink enough fluids to keep your urine pale yellow.  If you drink alcohol: ? Limit how much you use to:  0-1 drink a day for women.  0-2 drinks a day for men. ? Be aware of how much alcohol is in your drink. In the [place] one drink equals one 12 oz bottle of beer (355 mL) one 5 oz glass of wine (148 mL), or one 1 oz glass of hard liquor (44 mL). General instructions  Take over-the-counter and prescription medicines only as told by your health care provider.  Do not drive or use heavy machinery while taking prescription pain medicine.  Return to your normal activities as told by your health care provider. Ask your health care provider what activities are safe for you.  Keep all follow-up visits as told by your health care provider. This is important. Contact a health care provider if you have:  Another gout attack.  Continuing symptoms of a gout attack after 10 days of treatment.  Side effects from your medicines.  Chills or a fever.  Burning pain when you urinate.  Pain in your lower back or belly. Get help right away if you:  Have severe or uncontrolled pain.  Cannot urinate. Summary  Gout is painful swelling of the joints caused by inflammation.  The most common site of pain is the big  toe, but it can affect other joints in the body.  Medicines and dietary changes can help to prevent and treat gout attacks. This information is not intended to replace advice given to you by your health care provider. Make sure you discuss any questions you have with your health care provider. Document Revised: 06/08/2018 Document Reviewed: 06/08/2018 Elsevier Patient Education  2021 Elsevier Inc.  

## 2021-04-24 NOTE — Progress Notes (Signed)
Acute Office Visit  Subjective:    Patient ID: Michelle Ray, female    DOB: 11-12-1966, 55 y.o.   MRN: [id number]  Chief Complaint  Patient presents with  . Foot Swelling    Patient presents with complaints of left foot pain, swelling, and redness x 4 days (Sunday). Patient states she noticed on Sunday when she was getting ready for church that her left foot was red and swollen. Patient states she has tried ibuprofen, ice, and elevating the foot with little relief    HPI Patient is in today for pain in the left great toe that started suddenly upon awakening on Sunday morning.  Endorses erythema and edema to the right dorsal surface of the MTP joint with pain. She has used ice, elevation, and ibuprofen with little relief of symptoms. She endorses the pain is now extending medially of the joint and the erythema appears to be worsening. She denies any history of gout. She has had no injuries to the foot recently or historically. She denies pain elsewhere.   Review of Systems All review of systems negative except what is listed in the HPI     Objective:    Physical Exam Constitutional:      Appearance: She is well-developed and well-nourished.  Cardiovascular:     Pulses:          Dorsalis pedis pulses are 2+ on the right side and 2+ on the left side.       Posterior tibial pulses are 2+ on the right side and 2+ on the left side.  Musculoskeletal:     Left foot: Tenderness present.  Lymphadenopathy:   No other adenopathy present. Skin:    General: Skin is intact.  Neurological:     Mental Status: She is alert.  Psychiatric:        Mood and Affect: Mood and affect normal.     Foot/Ankle Musculoskeletal Exam  General      Constitutional: well-developed and well-nourished   Labored breathing: no   Psychiatric: normal mood and affect and no acute distress   Neurological: alert   Skin: intact   Lymphadenopathy: none  Gait    Limp: left  Inspection    Inspection -  Right      Right foot/ankle inspection is normal.     Inspection - Left      Erythema: moderate       Effusion: none       Edema: mild       Ecchymosis: none       Deformity: none       Alignment: normal       Previous foot incision: no previous incision     Previous ankle incision: no previous incision  Palpation    Palpation - Right      Right foot/ankle palpation is unremarkable.      Palpation - Left       Increased warmth: moderate       Masses: none       Crepitus: none       Tenderness: present         Anterior medial joint line: moderate    Range of Motion    Range of Motion - Right      Right foot/ankle range of motion is normal and full.      Range of Motion - Left      Left foot/ankle range of motion is normal and full.    Neurovascular  Neurovascular - Right      Dorsalis pedis: 2+     Posterior tibial: 2+     Capillary refill: well-perfused    Neurovascular - Left      Dorsalis pedis: 2+     Posterior tibial: 2+     Capillary refill: well-perfused   BP 115/71   Pulse 68   Ht 5\' 3"  (1.6 m)   Wt 235 lb 3.2 oz (106.7 kg)   SpO2 97%   BMI 41.66 kg/m  Wt Readings from Last 3 Encounters:  04/24/21 235 lb 3.2 oz (106.7 kg)  04/01/21 238 lb 6.4 oz (108.1 kg)  03/06/21 242 lb (109.8 kg)      Assessment & Plan:   Problem List Items Addressed This Visit    Acute gout involving toe of left foot - Primary    Sx and presentation consistent with gout to the left great toe at the MTP joint.  Steroid and ketorolac injection IM today Start steroid taper for 6 days, monitoring for rebound symptoms when complete Rest, ice, elevation, and compression recommended Monitor for worsening or returning symptoms. F/U PRN      Relevant Medications   predniSONE (STERAPRED UNI-PAK 21 TAB) 10 MG (21) TBPK tablet   HTN (hypertension), benign (Chronic)   Relevant Medications   amLODipine (NORVASC) 10 MG tablet   benazepril (LOTENSIN) 20 MG tablet    ezetimibe (ZETIA) 10 MG tablet   furosemide (LASIX) 40 MG tablet   triamterene-hydrochlorothiazide (MAXZIDE-25) 37.5-25 MG tablet   Semaglutide-Weight Management 2.4 MG/0.75ML SOAJ   Controlled type 2 diabetes mellitus with complication, without long-term current use of insulin (HCC)   Relevant Medications   benazepril (LOTENSIN) 20 MG tablet   Semaglutide-Weight Management 2.4 MG/0.75ML SOAJ   Mixed hyperlipidemia   Relevant Medications   amLODipine (NORVASC) 10 MG tablet   benazepril (LOTENSIN) 20 MG tablet   ezetimibe (ZETIA) 10 MG tablet   furosemide (LASIX) 40 MG tablet   triamterene-hydrochlorothiazide (MAXZIDE-25) 37.5-25 MG tablet   Semaglutide-Weight Management 2.4 MG/0.75ML SOAJ   BMI 40.0-44.9, adult (HCC)   Relevant Medications   Semaglutide-Weight Management 2.4 MG/0.75ML SOAJ   Aortic atherosclerosis (HCC)   Relevant Medications   amLODipine (NORVASC) 10 MG tablet   benazepril (LOTENSIN) 20 MG tablet   ezetimibe (ZETIA) 10 MG tablet   furosemide (LASIX) 40 MG tablet   triamterene-hydrochlorothiazide (MAXZIDE-25) 37.5-25 MG tablet   Semaglutide-Weight Management 2.4 MG/0.75ML SOAJ       Meds ordered this encounter  Medications  . amLODipine (NORVASC) 10 MG tablet    Sig: TAKE 1 TABLET BY MOUTH ONCE A DAY    Dispense:  90 tablet    Refill:  1  . benazepril (LOTENSIN) 20 MG tablet    Sig: TAKE 1 TABLET BY MOUTH ONCE A DAY    Dispense:  90 tablet    Refill:  1  . citalopram (CELEXA) 20 MG tablet    Sig: Take 1 tablet (20 mg total) by mouth at bedtime.    Dispense:  90 tablet    Refill:  1  . ezetimibe (ZETIA) 10 MG tablet    Sig: Take 1 tablet (10 mg total) by mouth daily.    Dispense:  90 tablet    Refill:  1  . furosemide (LASIX) 40 MG tablet    Sig: TAKE 1 TABLET BY MOUTH ONCE A DAY    Dispense:  90 tablet    Refill:  1  . triamterene-hydrochlorothiazide (MAXZIDE-25) 37.5-25 MG tablet  Sig: TAKE 1 TABLET BY MOUTH ONCE A DAY    Dispense:  90 tablet     Refill:  1  . Semaglutide-Weight Management 2.4 MG/0.75ML SOAJ    Sig: Inject 2.4 mg into the skin once a week.    Dispense:  3 mL    Refill:  6  . esomeprazole (NEXIUM) 40 MG capsule    Sig: TAKE 1 CAPSULE BY MOUTH ONCE A DAY    Dispense:  90 capsule    Refill:  1  . predniSONE (STERAPRED UNI-PAK 21 TAB) 10 MG (21) TBPK tablet    Sig: Take 6 tablets starting tomorrow, each day take one less tablet until you have finished the packet.    Dispense:  21 tablet    Refill:  1  . dexamethasone (DECADRON) injection 10 mg  . ketorolac (TORADOL) injection 60 mg     Orma Render, NP

## 2021-04-25 DIAGNOSIS — M109 Gout, unspecified: Secondary | ICD-10-CM | POA: Insufficient documentation

## 2021-04-25 NOTE — Assessment & Plan Note (Signed)
Sx and presentation consistent with gout to the left great toe at the MTP joint.  Steroid and ketorolac injection IM today Start steroid taper for 6 days, monitoring for rebound symptoms when complete Rest, ice, elevation, and compression recommended Monitor for worsening or returning symptoms. F/U PRN

## 2021-05-12 ENCOUNTER — Other Ambulatory Visit: Payer: Self-pay

## 2021-05-12 ENCOUNTER — Ambulatory Visit: Payer: 59 | Attending: Nurse Practitioner | Admitting: Physical Therapy

## 2021-05-12 ENCOUNTER — Encounter: Payer: Self-pay | Admitting: Physical Therapy

## 2021-05-12 DIAGNOSIS — N393 Stress incontinence (female) (male): Secondary | ICD-10-CM | POA: Diagnosis not present

## 2021-05-12 DIAGNOSIS — M6281 Muscle weakness (generalized): Secondary | ICD-10-CM | POA: Diagnosis not present

## 2021-05-12 DIAGNOSIS — R278 Other lack of coordination: Secondary | ICD-10-CM | POA: Diagnosis not present

## 2021-05-12 NOTE — Therapy (Signed)
[facility] [address], [place], [place], [address] Phone: [phone]   Fax:  [phone]  Physical Therapy Evaluation  Patient Details  Name: Michelle Ray MRN: [id number] Date of Birth: 11/06/1966 Referring Provider (PT): Jacolyn Reedy, NP   Encounter Date: 05/12/2021   PT End of Session - 05/12/21 1112     Visit Number 1    Date for PT Re-Evaluation 08/04/21    Authorization Type UMR    Authorization - Visit Number 1    Authorization - Number of Visits 25    PT Start Time 8527    PT Stop Time 1130    PT Time Calculation (min) 39 min    Activity Tolerance Patient tolerated treatment well    Behavior During Therapy WFL for tasks assessed/performed             Past Medical History:  Diagnosis Date   Allergy    Anemia    Aortic atherosclerosis ([place])    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    Subjective Assessment - 05/12/21 1052     Subjective Patient reports leakage with coughing, laughing, holding urine with work and dribble.    Patient Stated Goals help control leakage    Currently in Pain? No/denies                [facility] PT Assessment - 05/12/21 0001       Assessment   Medical Diagnosis N39.3 Stress incontinence in female    Referring Provider (PT) Jacolyn Reedy, NP    Prior Therapy None      Precautions   Precautions None      Restrictions   Weight Bearing Restrictions No      Balance Screen   Has the patient fallen in the past 6 months Yes    How  many times? 1   has ankle issues so she will just fall   Has the patient had a decrease in activity level because of a fear of falling?  No    Is the patient reluctant to leave their home because of a fear of falling?  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, lifting, works i nICU    Leisure none      Cognition   Overall Cognitive Status Within Functional Limits for tasks assessed      Observation/Other Assessments   Focus on Therapeutic Outcomes (FOTO)  [id number]      Posture/Postural Control   Posture/Postural Control No significant limitations      ROM / Strength   AROM / PROM / Strength AROM;PROM;Strength      AROM   Overall AROM Comments lumbar ROM is full      Strength   Right Hip Internal Rotation 4/5    Right Hip ABduction 4+/5  Left Hip Internal Rotation 4/5    Left Hip ABduction 4+/5      Palpation   SI assessment  ASIS are equal    Palpation comment decreased mobility of lower rib cage,                        Objective measurements completed on examination: See above findings.     Pelvic Floor Special Questions - 05/12/21 0001     Prior Pregnancies Yes    Number of Pregnancies 3    Number of Vaginal Deliveries 3   alot of stitiches, last pregnancy grade 3 tear   Currently Sexually Active Yes    Is this Painful No    Urinary Leakage Yes    Pad use 3 thick pads that get saturated; none at night    Activities that cause leaking Coughing;Laughing;Sneezing;With strong urge;Lifting;Walking;Exercising;Intercourse    Fecal incontinence No   strain to have a bowel movement   Fluid intake 8-9 glasses of water   somettimes 1 glass of water   Falling out feeling (prolapse) No    Skin Integrity Intact;Other    Skin Integrity other very dry    Prolapse Anterior Wall   just above the introitus when patient bears down   Pelvic Floor Internal  Exam Patient confirms identification and approves PT to assess pelvic floor and treatment    Exam Type Vaginal    Palpation tightness in the perineal body, posterior vaginal wall, along the introitus, increased tightness int he left side to the urethra spincter    Strength weak squeeze, no lift   1/5 for anterior wall             [facility] Adult PT Treatment/Exercise - 05/12/21 0001       Self-Care   Self-Care Other Self-Care Comments    Other Self-Care Comments  education on vaginal moiturizers                    PT Education - 05/12/21 1130     Education Details education on vaginal moisturizers    Person(s) Educated Patient    Methods Explanation;Handout;Demonstration    Comprehension Verbalized understanding;Returned demonstration              PT Short Term Goals - 05/12/21 1140       PT SHORT TERM GOAL #1   Title independent with intiial HEP    Time 4    Period Weeks    Status New    Target Date 06/09/21      PT SHORT TERM GOAL #2   Title education on vaginal moisturizers, lubricants, and vaginal health to improve tissue quality    Time 4    Period Weeks    Status New    Target Date 06/09/21               PT Long Term Goals - 05/12/21 1140       PT LONG TERM GOAL #1   Title independent with advanced HEP for pelvic floor and core strength    Time 4    Period Months    Status New    Target Date 09/11/21      PT LONG TERM GOAL #2   Title able to contract the pelvic floor in a circular fashion at 3/5 holding for 10 seconds    Time 4    Period Months    Status New    Target Date 09/11/21  PT LONG TERM GOAL #3   Title urinary leakage decreased >/= 80% due to improved strength and mobility of the pelvic floor with activity    Time 4    Period Months    Status New    Target Date 09/11/21      PT LONG TERM GOAL #4   Title urinary leakage during intercourse decreased >/= 75% due to improve pelvic floor strength    Time 4     Period Months    Status New    Target Date 09/11/21      PT LONG TERM GOAL #5   Title wearing 1 light day pad due to reduction of urinary leakage    Time 4    Period Months    Status New    Target Date 09/11/21                    Plan - 05/12/21 1131     Clinical Impression Statement Patient is a 55 year old female with stress incontinence for the past several years. Patient reports she will leak urine with laughing, sneezinf, coughing, lifting, bending, exericse and during intercourse. She does not leak during the night. She wears 3 pads curing the day and they will become full saturated. she does have to strain to have a bowel movement. Abodminal strength is 2/5. Bilateral hip internal rotation and abduction 4/5. Pelvic floor strength is 2/5 posteriorly and anteriorly is 1/5. She restrictions in the posterior vaginal canal, perineal body, left side of the urethra, and the sides of the introitus. Vaginal area is very dry and lack of color. Anterior wall weakness that goes to just above the introitus when bearing down. Decreased movement of the lower rib cage. Patient will benefit from skilled therapy to improve pelvic floor coordination and improve strength to reduce her leakage.    Personal Factors and Comorbidities Comorbidity 2;Fitness;Age;Sex    Comorbidities urterine fibroid removal; grade 3 vaginal tear with her last child    Examination-Activity Limitations Continence;Lift;Locomotion Level    Examination-Participation Restrictions Interpersonal Relationship;Occupation;Community Activity    Stability/Clinical Decision Making Stable/Uncomplicated    Clinical Decision Making Low    Rehab Potential Excellent    PT Frequency 1x / week   every other week   PT Duration Other (comment)   4 months   PT Treatment/Interventions ADLs/Self Care Home Management;Biofeedback;Therapeutic activities;Therapeutic exercise;Neuromuscular re-education;Patient/family education;Manual  techniques;Dry needling;Spinal Manipulations    PT Next Visit Plan education on vaginal lubricants; manual work to the pelvic floor post. introitus, and left side of urethra; lower rib cage mobility, lower abdominal engagement    Consulted and Agree with Plan of Care Patient             Patient will benefit from skilled therapeutic intervention in order to improve the following deficits and impairments:  Decreased coordination, Increased fascial restricitons, Decreased endurance, Decreased activity tolerance, Increased muscle spasms, Decreased strength  Visit Diagnosis: Muscle weakness (generalized) - Plan: PT plan of care cert/re-cert  Other lack of coordination - Plan: PT plan of care cert/re-cert  Stress incontinence (female) (female) - Plan: PT plan of care cert/re-cert     Problem List Patient Active Problem List   Diagnosis Date Noted   Acute gout involving toe of left foot 04/25/2021   Varicose veins of both legs with edema 02/17/2021   Stress incontinence in female 02/17/2021   Pain and swelling of right ankle 02/17/2021   Controlled type 2 diabetes mellitus with complication, without  long-term current use of insulin ([place]) 02/17/2021   Mixed hyperlipidemia 02/17/2021   Right upper quadrant abdominal pain 02/17/2021   BMI 40.0-44.9, adult ([place]) 02/17/2021   Aortic atherosclerosis ([place]) 02/17/2021   Chronic thumb pain, bilateral 02/17/2021   Vomiting and diarrhea 05/29/2016   Diarrhea 05/29/2016   HTN (hypertension), benign 05/29/2016    Earlie Counts, PT 05/12/21 11:47 AM  [facility] [address] [place], [place], [address] Phone: [phone]   Fax:  [phone]  Name: Heddy Dishon MRN: [id number] Date of Birth: Oct 21, 1966

## 2021-05-12 NOTE — Patient Instructions (Signed)

## 2021-05-26 ENCOUNTER — Ambulatory Visit: Payer: 59 | Admitting: Physical Therapy

## 2021-05-26 ENCOUNTER — Ambulatory Visit (HOSPITAL_BASED_OUTPATIENT_CLINIC_OR_DEPARTMENT_OTHER): Payer: 59 | Admitting: Nurse Practitioner

## 2021-06-03 ENCOUNTER — Other Ambulatory Visit (HOSPITAL_COMMUNITY): Payer: Self-pay

## 2021-07-01 ENCOUNTER — Other Ambulatory Visit (HOSPITAL_COMMUNITY): Payer: Self-pay

## 2021-07-04 ENCOUNTER — Other Ambulatory Visit (HOSPITAL_COMMUNITY): Payer: Self-pay

## 2021-07-04 ENCOUNTER — Encounter (HOSPITAL_BASED_OUTPATIENT_CLINIC_OR_DEPARTMENT_OTHER): Payer: Self-pay | Admitting: Nurse Practitioner

## 2021-07-04 DIAGNOSIS — N3090 Cystitis, unspecified without hematuria: Secondary | ICD-10-CM

## 2021-07-04 MED ORDER — CIPROFLOXACIN HCL 500 MG PO TABS
500.0000 mg | ORAL_TABLET | Freq: Two times a day (BID) | ORAL | 0 refills | Status: DC
  Filled 2021-07-04: qty 10, 5d supply, fill #0

## 2021-07-04 MED ORDER — FLUCONAZOLE 150 MG PO TABS
150.0000 mg | ORAL_TABLET | Freq: Once | ORAL | 0 refills | Status: AC
  Filled 2021-07-04: qty 2, 3d supply, fill #0

## 2021-07-09 ENCOUNTER — Encounter: Payer: Self-pay | Admitting: Physical Therapy

## 2021-07-09 ENCOUNTER — Ambulatory Visit: Payer: 59 | Attending: Nurse Practitioner | Admitting: Physical Therapy

## 2021-07-09 ENCOUNTER — Other Ambulatory Visit: Payer: Self-pay

## 2021-07-09 DIAGNOSIS — M6281 Muscle weakness (generalized): Secondary | ICD-10-CM | POA: Diagnosis not present

## 2021-07-09 DIAGNOSIS — R278 Other lack of coordination: Secondary | ICD-10-CM | POA: Diagnosis not present

## 2021-07-09 DIAGNOSIS — N393 Stress incontinence (female) (male): Secondary | ICD-10-CM | POA: Insufficient documentation

## 2021-07-09 NOTE — Therapy (Addendum)
[facility] [address], [place], [place], [address] Phone: [phone]   Fax:  [phone]  Physical Therapy Treatment  Patient Details  Name: Michelle Ray MRN: [id number] Date of Birth: 1966/07/02 Referring Provider (PT): Jacolyn Reedy, NP   Encounter Date: 07/09/2021   PT End of Session - 07/09/21 1100     Visit Number 2    Date for PT Re-Evaluation 08/04/21    Authorization Type UMR    Authorization - Visit Number 2    Authorization - Number of Visits 25    PT Start Time 7425    PT Stop Time 1055    PT Time Calculation (min) 40 min    Activity Tolerance Patient tolerated treatment well    Behavior During Therapy WFL for tasks assessed/performed             Past Medical History:  Diagnosis Date   Allergy    Anemia    Aortic atherosclerosis ([place])    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   Subjective Assessment - 07/09/21 1019     Subjective I only wear pads with travel or at work. Wearing 1-2 pads per day at work. Urinary leakage is 50% better. I still dribble after I urinate. The moistruzers are helping.    Patient Stated Goals help control leakage    Currently in Pain? No/denies    Multiple Pain Sites No                            Pelvic Floor Special Questions - 07/09/21 0001     Pelvic Floor Internal Exam Patient confirms identification and approves PT to assess pelvic floor and treatment    Exam Type Vaginal    Strength fair squeeze, definite  lift               [facility] Adult PT Treatment/Exercise - 07/09/21 0001       Self-Care   Self-Care Other Self-Care Comments    Other Self-Care Comments  discussed with patient on vaginal lubricants and what ingredients to avoid      Neuro Re-ed    Neuro Re-ed Details  therapist in the vaginal canal giving tactile cues to contract in a circular way      Lumbar Exercises: Stretches   Active Hamstring Stretch Right;Left;1 rep;30 seconds    Active Hamstring Stretch Limitations sitting    Lower Trunk Rotation 2 reps;30 seconds    Lower Trunk Rotation Limitations sitting    Hip Flexor Stretch Right;Left;1 rep;30 seconds    Hip Flexor Stretch Limitations bring arm over head on the side she is stretching    Piriformis Stretch Right;Left;1 rep;30 seconds    Piriformis Stretch Limitations sitting    Other Lumbar Stretch Exercise sitting hip adductor stretch, holding ofr 30 sec, right, left      Lumbar Exercises: Supine   Clam 10 reps;1 second  one leg at a time   Clam Limitations with therapist finger in the vaginal canal give tactile cues    Bent Knee Raise 10 reps;1 second    Bent Knee Raise Limitations with therapist finger in the vaginal canal to give tactile cues    Dead Bug 10 reps   just arms   Dead Bug Limitations with therapist finger in the vaginal canal giving tactile cues      Manual Therapy   Manual Therapy Internal Pelvic Floor;Soft tissue mobilization    Soft tissue mobilization bilateral hip adductors and lateral side of the labia majora    Internal Pelvic Floor along the introitus, posterior vaginal wall, and levator ani                    PT Education - 07/09/21 1056     Education Details Access Code: [email or web address]    Person(s) Educated Patient    Methods Explanation;Demonstration;Verbal cues;Handout    Comprehension Returned demonstration;Verbalized understanding              PT Short Term Goals - 07/09/21 1104       PT SHORT TERM GOAL #1    Title independent with intiial HEP    Time 4    Period Weeks    Status On-going      PT SHORT TERM GOAL #2   Title education on vaginal moisturizers, lubricants, and vaginal health to improve tissue quality    Time 4    Period Weeks    Status Achieved               PT Long Term Goals - 05/12/21 1140       PT LONG TERM GOAL #1   Title independent with advanced HEP for pelvic floor and core strength    Time 4    Period Months    Status New    Target Date 09/11/21      PT LONG TERM GOAL #2   Title able to contract the pelvic floor in a circular fashion at 3/5 holding for 10 seconds    Time 4    Period Months    Status New    Target Date 09/11/21      PT LONG TERM GOAL #3   Title urinary leakage decreased >/= 80% due to improved strength and mobility of the pelvic floor with activity    Time 4    Period Months    Status New    Target Date 09/11/21      PT LONG TERM GOAL #4   Title urinary leakage during intercourse decreased >/= 75% due to improve pelvic floor strength    Time 4    Period Months    Status New    Target Date 09/11/21      PT LONG TERM GOAL #5   Title wearing 1 light day pad due to reduction of urinary leakage    Time 4    Period Months    Status New    Target Date 09/11/21                   Plan - 07/09/21 1100     Clinical Impression Statement Patient is not wearing 1-2 pads when she is out or at work now. Patient pelvic floor strength has increased to 3/5 with a good circular contraction. Patient has difficulty holding the pelvic floor contraction with leg movements. She is using the moisturizers and it is helping  with vaginal health. Patient will benefit from skilled therapy tto improve pelvic floor coordinaiton and improve strength to reduce her leakage.    Personal Factors and Comorbidities Comorbidity 2;Fitness;Age;Sex    Comorbidities urterine fibroid removal; grade 3 vaginal tear with her last child    Examination-Activity  Limitations Continence;Lift;Locomotion Level    Examination-Participation Restrictions Interpersonal Relationship;Occupation;Community Activity    Stability/Clinical Decision Making Stable/Uncomplicated    Rehab Potential Excellent    PT Frequency 1x / week   every other week   PT Duration Other (comment)   months   PT Treatment/Interventions ADLs/Self Care Home Management;Biofeedback;Therapeutic activities;Therapeutic exercise;Neuromuscular re-education;Patient/family education;Manual techniques;Dry needling;Spinal Manipulations    PT Next Visit Plan work on pelvic floor strength in standing and progress dead bug    PT Home Exercise Plan Access Code: [email or web address]    Recommended Other Services MD signed initial note    Consulted and Agree with Plan of Care Patient             Patient will benefit from skilled therapeutic intervention in order to improve the following deficits and impairments:  Decreased coordination, Increased fascial restricitons, Decreased endurance, Decreased activity tolerance, Increased muscle spasms, Decreased strength  Visit Diagnosis: Muscle weakness (generalized)  Other lack of coordination  Stress incontinence (female) (female)     Problem List Patient Active Problem List   Diagnosis Date Noted   Acute gout involving toe of left foot 04/25/2021   Varicose veins of both legs with edema 02/17/2021   Stress incontinence in female 02/17/2021   Pain and swelling of right ankle 02/17/2021   Controlled type 2 diabetes mellitus with complication, without long-term current use of insulin ([place]) 02/17/2021   Mixed hyperlipidemia 02/17/2021   Right upper quadrant abdominal pain 02/17/2021   BMI 40.0-44.9, adult ([place]) 02/17/2021   Aortic atherosclerosis ([place]) 02/17/2021   Chronic thumb pain, bilateral 02/17/2021   Vomiting and diarrhea 05/29/2016   Diarrhea 05/29/2016   HTN (hypertension), benign 05/29/2016    Earlie Counts, PT 07/09/21 11:06 AM  [facility] [address], [place], [place], [address] Phone: [phone]   Fax:  [phone]  Name: Michelle Ray MRN: [id number] Date of Birth: 11/23/1966  PHYSICAL THERAPY DISCHARGE SUMMARY  Visits from Start of Care: 2  Current functional level related to goals / functional outcomes: See above. Patient has cancelled all the appointments. She has not rescheduled any further appointments.    Remaining deficits: See above.    Education / Equipment: HEP   Patient agrees to discharge. Patient goals were not met. Patient is being discharged due to not returning since the last visit. Thank you for the referral. Earlie Counts, PT 08/06/21 10:20 AM

## 2021-07-09 NOTE — Patient Instructions (Addendum)
  Lubrication Used for intercourse to reduce friction Avoid ones that have glycerin, warming gels, tingling gels, icing or cooling gel, scented Avoid parabens due to a preservative similar to female sex hormone Bahri need to be reapplied once or several times during sexual activity Can be applied to both partners genitals prior to vaginal penetration to minimize friction or irritation Prevent irritation and mucosal tears that cause post coital pain and increased the risk of vaginal and urinary tract infections Oil-based lubricants cannot be used with condoms due to breaking them down.  Least likely to irritate vaginal tissue.  Plant based-lubes are safe Silicone-based lubrication are thicker and last long and used for post-menopausal women  Vaginal Lubricators Here is a list of some suggested lubricators you can use for intercourse. Use the most hypoallergenic product.  You can place on you or your partner.  Slippery Stuff ( water based) Sylk or Sliquid Natural H2O ( good  if frequent UTI's)- walmart, amazon Sliquid organics silk-(aloe and silicone based ) [employer] (www.blossom-organics.com)- (aloe based ) Coconut oil, olive oil -not good with condoms  PJur Woman Nude- (water based) amazon Uberlube- ( silicon) [place] has an organic one Yes lubricant- (water based and has plant oil based similar to silicone) [employer] Platinum-Silicone, Target, [facility] Olive and Bee intimate cream-  www.oliveandbee.com.au Pink - [employer] stuff Erosense Sync- walmart, amazon Coconu- FailLinks.co.uk  Things to avoid in lubricants are glycerin, warming gels, tingling gels, icing or cooling  gels, and scented gels.  Also avoid Vaseline. KY jelly, Replens, and Astroglide contain chlorhexidine which kills good bacteria(lactobacilli)  Things to avoid in the vaginal area Do not use things to irritate the vulvar area No lotions- see below Soaps you  can use :Aveeno, [place], Good  Clean Love cleanser if needed. Must be gentle No deodorants No douches Good to sleep without underwear to let the vaginal area to air out No scrubbing: spread the lips to let warm water rinse over labias and pat dry  Creams that can be used on the Vulva Area V [employer], [place] MoonMaid Botanical Pro-Meno Wild Yam Cream Coconut oil, olive oil Cleo by [employer] labial moisturizer -Amazon,  [place] Releveum ( lidocaine) or [place] [employer] Yes Moisturizer    Access Code: [email or web address] URL: [email or web address] Date: 07/09/2021 Prepared by: Earlie Counts  Exercises Bent Knee Fallouts - 1 x daily - 4 x weekly - 2 sets - 10 reps Supine March - 1 x daily - 4 x weekly - 2 sets - 10 reps Dead Bug Alternating Arm Extension - 1 x daily - 4 x weekly - 2 sets - 10 reps  URL: [email or web address] Date: 07/09/2021 Prepared by: Earlie Counts  Exercises Bent Knee Fallouts - 1 x daily - 4 x weekly - 2 sets - 10 reps Supine March - 1 x daily - 4 x weekly - 2 sets - 10 reps Dead Bug Alternating Arm Extension - 1 x daily - 4 x weekly - 2 sets - 10 reps  [facility] [address] [place], [place] [address] Phone # [phone] Fax [phone]

## 2021-07-17 ENCOUNTER — Ambulatory Visit: Payer: 59 | Admitting: Vascular Surgery

## 2021-07-21 ENCOUNTER — Encounter: Payer: 59 | Admitting: Physical Therapy

## 2021-07-28 ENCOUNTER — Other Ambulatory Visit (HOSPITAL_COMMUNITY): Payer: Self-pay

## 2021-08-07 ENCOUNTER — Encounter: Payer: 59 | Admitting: Physical Therapy

## 2021-08-11 ENCOUNTER — Other Ambulatory Visit (HOSPITAL_COMMUNITY): Payer: Self-pay

## 2021-08-20 ENCOUNTER — Other Ambulatory Visit (HOSPITAL_COMMUNITY): Payer: Self-pay

## 2021-08-21 ENCOUNTER — Ambulatory Visit (HOSPITAL_BASED_OUTPATIENT_CLINIC_OR_DEPARTMENT_OTHER): Payer: 59 | Admitting: Nurse Practitioner

## 2021-08-21 ENCOUNTER — Encounter: Payer: 59 | Admitting: Physical Therapy

## 2021-08-22 ENCOUNTER — Other Ambulatory Visit (HOSPITAL_COMMUNITY): Payer: Self-pay

## 2021-08-25 NOTE — Patient Instructions (Addendum)
Recommendations from today's visit: Follow-up in 6 months for DM, BP CT abd ordered today for pain and to check kidney Labs have been ordered and should be available for [facility] lab to see. If they need a paper copy, let me know and we can fax those over to [facility] Call if you have any changes in your pain, questions, or concerns.

## 2021-08-25 NOTE — Progress Notes (Signed)
Virtual Visit Encounter telephone visit.   I connected with  Michelle Ray on 08/29/21 at  8:10 AM EDT by secure audio and/or video enabled telemedicine application. I verified that I am speaking with the correct person using two identifiers.   I introduced myself as a Designer, jewellery with the practice. The limitations of evaluation and management by telemedicine discussed with the patient and the availability of in person appointments. The patient expressed verbal understanding and consent to proceed.  Participating parties in this visit include: Myself and patient  The patient is: Patient Location: Home I am: Provider Location: Office/Clinic Subjective:    CC and HPI: Michelle Ray is a 55 y.o. year old female presenting for follow up of DM, Weight. She is also experiencing ongoing abdominal pain.   Abdominal Pain: Chronic in nature, but over the past 2-3 weeks has changed.  Comes from the back under the right rib cage and radiates up to shoulders and down to the right hip.  Occasional nausea with no vomiting or diarrhea Constant in nature and does not change with eating or fasting.  She expresses tenderness with palpation over the RUQ and epigastric regions.  No fevers or chills. Intermittent abdominal distension and bloating.  She has had a colonoscopy about 4 years ago and was told to repeat in 5 years due to history of polyps.  Previous CT showed atrophy of R kidney- no follow-up has been made.  The pain is stabbing in nature, which is a change from aching in nature in the past. The change is significant enough that she is concerned and would like to have repeat imaging to determine if this is the kidney or another complication.  Blood pressures have been stable.  Has had urinary symptoms recently for which she was treated for UTI.    Diabetes and HTN Doing well with her diabetes. On Ozempic with no side effects.  Average fasting BG 110 in the mornings She has also experienced  significant weight loss since starting the Ozempic, which she is happy about. Her blood pressures have been stable No dizziness, HA, ShOB, CP  Past medical history, Surgical history, Family history not pertinant except as noted below, Social history, Allergies, and medications have been entered into the medical record, reviewed, and corrections made.   Review of Systems:  All review of systems negative except what is listed in the HPI  Objective:    Alert and oriented x 4 Speaking in clear sentences with no shortness of breath. No distress.  Impression and Recommendations:    Problem List Items Addressed This Visit     HTN (hypertension), benign - Primary (Chronic)    Well controlled. No changes to medication. Labs ordered- she will do these at the lab at work. F/U in 6 months or sooner if needed for labs      Relevant Orders   Comprehensive metabolic panel   CBC with Differential/Platelet   Hemoglobin A1c   Controlled type 2 diabetes mellitus with complication, without long-term current use of insulin (HCC)    Appears to be doing very well based on average fasting BG She is losing weight and eating better She is on medication for lipids and BP is well controlled.  UTD on annual eye exam Continue medications- she will have labs checked at work Will make any changes to plan of care as needed once results received. F/U in 6 months or sooner if needed Will need microalbumin and foot exam at next visit with labs  Relevant Orders   Comprehensive metabolic panel   CBC with Differential/Platelet   Hemoglobin A1c   Mixed hyperlipidemia    On Zetia Labs ordered by cardiology to be done next week- will follow Has had 17lb weight loss on Ozempic. BP stable Follow-up with cardiology       Relevant Orders   Comprehensive metabolic panel   CBC with Differential/Platelet   Hemoglobin A1c   Right upper quadrant abdominal pain    Symptoms changing with increased frequency  and sharp stabbing sensation as opposed to dull ache Concerns for atrophy of kidney found on imaging in 2017 on the right side that Cosens be contributing to her symptoms.  Alternative considerations are liver/gallbladder given the details.  Will obtain labs- she will have these done at work- and CT abdomen for further evaluation Emergency warnings discussed with patient She will let us know if her symptoms change. Will follow-up once imaging results and labs received.       Relevant Orders   CT Abdomen Pelvis Wo Contrast   Small right kidney    See RUQ pain Incidental finding on abdominal CT 2017 No follow-up since that time Reassess with imaging given new pain      Atrophy of right kidney    Incidental finding on imaging in 2017. Current symptoms of RUQ abdominal pain, which have been chronic but are changing in nature and frequency in the past 3 weeks.  Concerns over kidney function causing issues CT ordered today for abdomen Will make changes to plan of care based on imaging results.         I discussed the assessment and treatment plan with the patient. The patient was provided an opportunity to ask questions and all were answered. The patient agreed with the plan and demonstrated an understanding of the instructions.   The patient was advised to call back or seek an in-person evaluation if the symptoms worsen or if the condition fails to improve as anticipated.  Follow-Up: in 6 months  I provided 20 minutes of non-face-to-face interaction with this non face-to-face encounter including intake, same-day documentation, and chart review.   Orma Render, NP , DNP, AGNP-c [place] [facility] [phone] (fax)

## 2021-08-29 ENCOUNTER — Encounter (HOSPITAL_BASED_OUTPATIENT_CLINIC_OR_DEPARTMENT_OTHER): Payer: Self-pay | Admitting: Nurse Practitioner

## 2021-08-29 ENCOUNTER — Other Ambulatory Visit: Payer: Self-pay

## 2021-08-29 ENCOUNTER — Ambulatory Visit (INDEPENDENT_AMBULATORY_CARE_PROVIDER_SITE_OTHER): Payer: 59 | Admitting: Nurse Practitioner

## 2021-08-29 VITALS — Ht 63.0 in | Wt 232.0 lb

## 2021-08-29 DIAGNOSIS — I10 Essential (primary) hypertension: Secondary | ICD-10-CM | POA: Diagnosis not present

## 2021-08-29 DIAGNOSIS — E118 Type 2 diabetes mellitus with unspecified complications: Secondary | ICD-10-CM

## 2021-08-29 DIAGNOSIS — E782 Mixed hyperlipidemia: Secondary | ICD-10-CM | POA: Diagnosis not present

## 2021-08-29 DIAGNOSIS — N261 Atrophy of kidney (terminal): Secondary | ICD-10-CM | POA: Diagnosis not present

## 2021-08-29 DIAGNOSIS — R1011 Right upper quadrant pain: Secondary | ICD-10-CM | POA: Diagnosis not present

## 2021-08-29 DIAGNOSIS — N270 Small kidney, unilateral: Secondary | ICD-10-CM | POA: Diagnosis not present

## 2021-08-29 NOTE — Assessment & Plan Note (Signed)
Well controlled. No changes to medication. Labs ordered- she will do these at the lab at work. F/U in 6 months or sooner if needed for labs

## 2021-08-29 NOTE — Assessment & Plan Note (Signed)
On Zetia Labs ordered by cardiology to be done next week- will follow Has had 17lb weight loss on Ozempic. BP stable Follow-up with cardiology

## 2021-08-29 NOTE — Assessment & Plan Note (Signed)
See RUQ pain Incidental finding on abdominal CT 2017 No follow-up since that time Reassess with imaging given new pain

## 2021-08-29 NOTE — Assessment & Plan Note (Signed)
Appears to be doing very well based on average fasting BG She is losing weight and eating better She is on medication for lipids and BP is well controlled.  UTD on annual eye exam Continue medications- she will have labs checked at work Will make any changes to plan of care as needed once results received. F/U in 6 months or sooner if needed Will need microalbumin and foot exam at next visit with labs

## 2021-08-29 NOTE — Assessment & Plan Note (Signed)
Symptoms changing with increased frequency and sharp stabbing sensation as opposed to dull ache Concerns for atrophy of kidney found on imaging in 2017 on the right side that Schlie be contributing to her symptoms.  Alternative considerations are liver/gallbladder given the details.  Will obtain labs- she will have these done at work- and CT abdomen for further evaluation Emergency warnings discussed with patient She will let us know if her symptoms change. Will follow-up once imaging results and labs received.

## 2021-08-29 NOTE — Assessment & Plan Note (Addendum)
>>  ASSESSMENT AND PLAN FOR ATROPHY OF RIGHT KIDNEY WRITTEN ON 08/29/2021  2:51 PM BY Emmert Roethler E, NP  Incidental finding on imaging in 2017. Current symptoms of RUQ abdominal pain, which have been chronic but are changing in nature and frequency in the past 3 weeks.  Concerns over kidney function causing issues CT ordered today for abdomen Will make changes to plan of care based on imaging results.   >>ASSESSMENT AND PLAN FOR SMALL RIGHT KIDNEY WRITTEN ON 08/29/2021  2:55 PM BY Jock Mahon E, NP  See RUQ pain Incidental finding on abdominal CT 2017 No follow-up since that time Reassess with imaging given new pain

## 2021-09-08 ENCOUNTER — Other Ambulatory Visit (HOSPITAL_BASED_OUTPATIENT_CLINIC_OR_DEPARTMENT_OTHER): Payer: Self-pay

## 2021-09-09 ENCOUNTER — Other Ambulatory Visit (HOSPITAL_COMMUNITY): Payer: Self-pay

## 2021-09-11 ENCOUNTER — Other Ambulatory Visit (HOSPITAL_COMMUNITY): Payer: Self-pay

## 2021-09-12 ENCOUNTER — Other Ambulatory Visit (HOSPITAL_COMMUNITY): Payer: Self-pay

## 2021-09-15 ENCOUNTER — Other Ambulatory Visit (HOSPITAL_COMMUNITY): Payer: Self-pay

## 2021-09-17 ENCOUNTER — Other Ambulatory Visit: Payer: Self-pay

## 2021-09-17 ENCOUNTER — Other Ambulatory Visit (HOSPITAL_COMMUNITY): Payer: Self-pay

## 2021-09-17 ENCOUNTER — Ambulatory Visit
Admission: RE | Admit: 2021-09-17 | Discharge: 2021-09-17 | Disposition: A | Discharge status: Home / Self Care | Payer: 59 | Source: Ambulatory Visit | Attending: Nurse Practitioner | Admitting: Nurse Practitioner

## 2021-09-17 DIAGNOSIS — R1011 Right upper quadrant pain: Secondary | ICD-10-CM | POA: Diagnosis not present

## 2021-09-17 DIAGNOSIS — K449 Diaphragmatic hernia without obstruction or gangrene: Secondary | ICD-10-CM | POA: Diagnosis not present

## 2021-09-18 ENCOUNTER — Other Ambulatory Visit (HOSPITAL_COMMUNITY): Payer: Self-pay

## 2021-09-18 ENCOUNTER — Other Ambulatory Visit (HOSPITAL_BASED_OUTPATIENT_CLINIC_OR_DEPARTMENT_OTHER): Payer: Self-pay

## 2021-09-18 ENCOUNTER — Other Ambulatory Visit (HOSPITAL_BASED_OUTPATIENT_CLINIC_OR_DEPARTMENT_OTHER): Payer: Self-pay | Admitting: Nurse Practitioner

## 2021-09-20 ENCOUNTER — Encounter (HOSPITAL_BASED_OUTPATIENT_CLINIC_OR_DEPARTMENT_OTHER): Payer: Self-pay | Admitting: Nurse Practitioner

## 2021-09-20 DIAGNOSIS — K429 Umbilical hernia without obstruction or gangrene: Secondary | ICD-10-CM

## 2021-09-23 ENCOUNTER — Other Ambulatory Visit (HOSPITAL_COMMUNITY): Payer: Self-pay

## 2021-09-23 ENCOUNTER — Other Ambulatory Visit (HOSPITAL_BASED_OUTPATIENT_CLINIC_OR_DEPARTMENT_OTHER): Payer: Self-pay | Admitting: Nurse Practitioner

## 2021-09-24 ENCOUNTER — Other Ambulatory Visit (HOSPITAL_COMMUNITY): Payer: Self-pay

## 2021-09-27 ENCOUNTER — Other Ambulatory Visit (HOSPITAL_COMMUNITY)
Admission: RE | Admit: 2021-09-27 | Discharge: 2021-09-27 | Disposition: A | Discharge status: Home / Self Care | Payer: 59 | Source: Ambulatory Visit | Attending: Nurse Practitioner | Admitting: Nurse Practitioner

## 2021-09-27 DIAGNOSIS — I10 Essential (primary) hypertension: Secondary | ICD-10-CM | POA: Insufficient documentation

## 2021-09-27 DIAGNOSIS — E782 Mixed hyperlipidemia: Secondary | ICD-10-CM | POA: Diagnosis not present

## 2021-09-27 DIAGNOSIS — E118 Type 2 diabetes mellitus with unspecified complications: Secondary | ICD-10-CM | POA: Insufficient documentation

## 2021-09-27 LAB — HEMOGLOBIN A1C
Hgb A1c MFr Bld: 5.8 % — ABNORMAL HIGH (ref 4.8–5.6)
Mean Plasma Glucose: 119.76 mg/dL

## 2021-09-27 LAB — COMPREHENSIVE METABOLIC PANEL
ALT: 20 U/L (ref 0–44)
AST: 19 U/L (ref 15–41)
Albumin: 4.3 g/dL (ref 3.5–5.0)
Alkaline Phosphatase: 65 U/L (ref 38–126)
Anion gap: 8 (ref 5–15)
BUN: 18 mg/dL (ref 6–20)
CO2: 27 mmol/L (ref 22–32)
Calcium: 9.3 mg/dL (ref 8.9–10.3)
Chloride: 101 mmol/L (ref 98–111)
Creatinine, Ser: 0.95 mg/dL (ref 0.44–1.00)
GFR, Estimated: >60 mL/min (ref >60)
Glucose, Bld: 132 mg/dL — ABNORMAL HIGH (ref 70–99)
Potassium: 2.9 mmol/L — ABNORMAL LOW (ref 3.5–5.1)
Sodium: 136 mmol/L (ref 135–145)
Total Bilirubin: 0.6 mg/dL (ref 0.3–1.2)
Total Protein: 8.5 g/dL — ABNORMAL HIGH (ref 6.5–8.1)

## 2021-09-27 LAB — CBC WITH DIFFERENTIAL/PLATELET
Abs Immature Granulocytes: 0.04 10*3/uL (ref 0.00–0.07)
Basophils Absolute: 0 10*3/uL (ref 0.0–0.1)
Basophils Relative: 0 %
Eosinophils Absolute: 0.2 10*3/uL (ref 0.0–0.5)
Eosinophils Relative: 3 %
HCT: 39.3 % (ref 36.0–46.0)
Hemoglobin: 13.2 g/dL (ref 12.0–15.0)
Immature Granulocytes: 0 %
Lymphocytes Relative: 30 %
Lymphs Abs: 2.7 10*3/uL (ref 0.7–4.0)
MCH: 29.7 pg (ref 26.0–34.0)
MCHC: 33.6 g/dL (ref 30.0–36.0)
MCV: 88.3 fL (ref 80.0–100.0)
Monocytes Absolute: 0.6 10*3/uL (ref 0.1–1.0)
Monocytes Relative: 6 %
Neutro Abs: 5.6 10*3/uL (ref 1.7–7.7)
Neutrophils Relative %: 61 %
Platelets: 313 10*3/uL (ref 150–400)
RBC: 4.45 MIL/uL (ref 3.87–5.11)
RDW: 13 % (ref 11.5–15.5)
WBC: 9.2 10*3/uL (ref 4.0–10.5)
nRBC: 0 % (ref 0.0–0.2)

## 2021-09-29 ENCOUNTER — Other Ambulatory Visit (HOSPITAL_COMMUNITY): Payer: Self-pay

## 2021-09-29 ENCOUNTER — Other Ambulatory Visit (HOSPITAL_BASED_OUTPATIENT_CLINIC_OR_DEPARTMENT_OTHER): Payer: Self-pay | Admitting: Nurse Practitioner

## 2021-09-29 DIAGNOSIS — E876 Hypokalemia: Secondary | ICD-10-CM

## 2021-09-29 DIAGNOSIS — M109 Gout, unspecified: Secondary | ICD-10-CM

## 2021-09-29 MED ORDER — POTASSIUM CHLORIDE CRYS ER 20 MEQ PO TBCR
EXTENDED_RELEASE_TABLET | ORAL | 2 refills | Status: DC
  Filled 2021-09-29: qty 37, 30d supply, fill #0
  Filled 2021-11-15: qty 37, 30d supply, fill #1
  Filled 2022-01-16: qty 37, 30d supply, fill #2

## 2021-09-29 MED ORDER — PREDNISONE 10 MG (21) PO TBPK
ORAL_TABLET | ORAL | 1 refills | Status: DC
  Filled 2021-09-29: qty 21, 6d supply, fill #0
  Filled 2021-10-21: qty 21, 6d supply, fill #1

## 2021-09-30 ENCOUNTER — Other Ambulatory Visit (HOSPITAL_COMMUNITY): Payer: Self-pay

## 2021-09-30 MED FILL — Ondansetron HCl Tab 4 MG: ORAL | 7 days supply | Qty: 30 | Fill #0 | Status: AC

## 2021-10-01 ENCOUNTER — Ambulatory Visit: Payer: 59 | Admitting: Vascular Surgery

## 2021-10-20 ENCOUNTER — Other Ambulatory Visit (HOSPITAL_COMMUNITY): Payer: Self-pay

## 2021-10-22 ENCOUNTER — Other Ambulatory Visit (HOSPITAL_COMMUNITY): Payer: Self-pay

## 2021-11-15 ENCOUNTER — Other Ambulatory Visit (HOSPITAL_BASED_OUTPATIENT_CLINIC_OR_DEPARTMENT_OTHER): Payer: Self-pay | Admitting: Nurse Practitioner

## 2021-11-17 ENCOUNTER — Other Ambulatory Visit (HOSPITAL_COMMUNITY): Payer: Self-pay

## 2021-11-17 MED ORDER — AMLODIPINE BESYLATE 10 MG PO TABS
ORAL_TABLET | Freq: Every day | ORAL | 1 refills | Status: DC
  Filled 2021-11-17: qty 90, 90d supply, fill #0
  Filled 2022-03-23: qty 90, 90d supply, fill #1

## 2021-11-17 MED ORDER — CITALOPRAM HYDROBROMIDE 20 MG PO TABS
20.0000 mg | ORAL_TABLET | Freq: Every day | ORAL | 1 refills | Status: DC
Start: 2021-11-17 — End: 2022-02-27
  Filled 2021-11-17: qty 90, 90d supply, fill #0

## 2021-12-10 ENCOUNTER — Other Ambulatory Visit: Payer: Self-pay

## 2021-12-10 ENCOUNTER — Ambulatory Visit: Payer: 59 | Admitting: Vascular Surgery

## 2021-12-10 ENCOUNTER — Encounter: Payer: Self-pay | Admitting: Vascular Surgery

## 2021-12-10 VITALS — BP 113/72 | HR 71 | Temp 97.6°F | Resp 16 | Ht 63.0 in | Wt 234.0 lb

## 2021-12-10 DIAGNOSIS — I83893 Varicose veins of bilateral lower extremities with other complications: Secondary | ICD-10-CM

## 2021-12-10 DIAGNOSIS — I872 Venous insufficiency (chronic) (peripheral): Secondary | ICD-10-CM | POA: Diagnosis not present

## 2021-12-10 NOTE — Progress Notes (Signed)
REASON FOR VISIT:   Follow-up of chronic venous insufficiency.  MEDICAL ISSUES:   CHRONIC VENOUS INSUFFICIENCY: This patient has painful varicose veins of the right lower extremity with both superficial and deep venous reflux.  We have discussed the importance of intermittent leg elevation and the proper positioning for this.  In addition, I have encouraged her to avoid prolonged sitting and standing.  We discussed importance of exercise specifically walking and water aerobics.  I have encouraged her to continue to wear her thigh-high compression stockings.  We also discussed the importance of maintaining healthy weight as abdominal obesity especially increases lower extremity venous pressure.  Given that she is having persistent symptoms I think she would be a candidate for laser ablation of the right great saphenous vein with 10-20 stab phlebectomies.  I would likely cannulate the vein in the mid to distal thigh.  The vein does get superficial in the distal thigh.  I have discussed the indications for endovenous laser ablation of the right GSV, that is to lower the pressure in the veins and potentially help relieve the symptoms from venous hypertension.  I have also discussed alternative options such as conservative treatment as described above. I have discussed the potential complications of the procedure, including bleeding, bruising, leg swelling, deep venous thrombosis (<1% risk), or failure of the vein to close <1% risk).  Given the vein becomes more superficial in the distal thigh I have explained that there is some risk of skin irritation in this area.  I have also explained that venous insufficiency is a chronic disease, and that the patient is at risk for recurrent varicose veins in the future.  All of the patient's questions were encouraged and answered. They are agreeable to proceed.   I have discussed with the patient the indications for stab phlebectomy.  I have explained to the  patient that that will have small scars from the stab incisions.  I explained that the other risks include bruising, bleeding, and phlebitis.    HPI:   Michelle Ray is a pleasant 56 y.o. female who was seen by Arlee Muslim, PA on 04/01/2021.  She presented for evaluation of right lower extremity swelling and painful varicose veins.  She was having aching pain heaviness and a tired feeling in her legs which was worse at the end of the day.  She has had symptoms for 30 years.  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  Her symptoms were relieved with elevation.  She had been wearing knee-high compression stockings.  She has no previous history of DVT and has had no previous venous procedures.  She had both superficial and deep venous reflux on her duplex scan.  She was fitted for thigh-high compression stockings with a gradient of 20 to 30 mmHg.  She was encouraged to elevate her legs and try to avoid prolonged sitting and standing.  She was to take ibuprofen as needed for symptoms.  She comes in for a 61-month follow-up visit.  It was felt that if her symptoms had not improved she might be a candidate for laser ablation and stab phlebectomies.  On my history, the patient describes aching pain heaviness and a tired feeling in her legs which is aggravated by sitting and standing and relieved with elevation.  She works as a Marine scientist and is on her feet for long hours.  Her symptoms are worse at the end of the day.  She has been wearing  her thigh-high compression stockings which helped some but she is still having significant symptoms.  She tries to elevate her legs every day.  She tries to stay active.  She has no previous history of DVT and has had no previous venous procedures.  Past Medical History:  Diagnosis Date   Allergy    Anemia    Aortic atherosclerosis (HCC)    Benign secondary hypertension due to renal artery stenosis (HCC)    Constipation    uses OTC stool softener prn only- this is not  chronic   Diabetes mellitus without complication (HCC)    GERD (gastroesophageal reflux disease)    Hyperlipemia    Hyperlipidemia    Hypertension     Family History  Problem Relation Age of Onset   Atrial fibrillation Mother    Pulmonary embolism Mother    Breast cancer Mother 21   Heart failure Sister    Hypertension Maternal Grandmother    Heart disease Maternal Grandfather    Stroke Paternal Grandfather    Colon cancer Neg Hx    Colon polyps Neg Hx     SOCIAL HISTORY: Social History   Tobacco Use   Smoking status: Former   Smokeless tobacco: Never  Substance Use Topics   Alcohol use: No    Allergies  Allergen Reactions   Macrobid [Nitrofurantoin]    Pravastatin     Joint pain    Current Outpatient Medications  Medication Sig Dispense Refill   AMBULATORY NON FORMULARY MEDICATION IBgard Take 1-2 capsules by mouth three times daily as needed. 12 capsule 0   amLODipine (NORVASC) 10 MG tablet TAKE 1 TABLET BY MOUTH ONCE A DAY 90 tablet 1   benazepril (LOTENSIN) 20 MG tablet TAKE 1 TABLET BY MOUTH ONCE A DAY 90 tablet 1   citalopram (CELEXA) 20 MG tablet Take 1 tablet by mouth at bedtime. 90 tablet 1   esomeprazole (NEXIUM) 40 MG capsule TAKE 1 CAPSULE BY MOUTH ONCE A DAY 90 capsule 1   ezetimibe (ZETIA) 10 MG tablet Take 1 tablet (10 mg total) by mouth daily. 90 tablet 1   furosemide (LASIX) 40 MG tablet TAKE 1 TABLET BY MOUTH ONCE A DAY 90 tablet 1   ondansetron (ZOFRAN) 4 MG tablet TAKE 1 TABLET BY MOUTH EVERY 6 TO 8 HOURS AS NEEDED 30 tablet 0   potassium chloride SA (KLOR-CON M) 20 MEQ tablet Take 1 tablet by mouth twice a day for 1 week then once a day. Recheck in 4 weeks. 37 tablet 2   predniSONE (STERAPRED UNI-PAK 21 TAB) 10 MG (21) TBPK tablet Take 6 tablets starting tomorrow, each day take one less tablet until you have finished the packet. 21 tablet 1   Semaglutide-Weight Management 2.4 MG/0.75ML SOAJ Inject 2.4 mg into the skin once a week. 3 mL 6    triamterene-hydrochlorothiazide (MAXZIDE-25) 37.5-25 MG tablet TAKE 1 TABLET BY MOUTH ONCE A DAY 90 tablet 1   No current facility-administered medications for this visit.    REVIEW OF SYSTEMS:  [X]  denotes positive finding, [ ]  denotes negative finding Cardiac  Comments:  Chest pain or chest pressure:    Shortness of breath upon exertion:    Short of breath when lying flat:    Irregular heart rhythm:        Vascular    Pain in calf, thigh, or hip brought on by ambulation:    Pain in feet at night that wakes you up from your sleep:  Blood clot in your veins:    Leg swelling:  x       Pulmonary    Oxygen at home:    Productive cough:     Wheezing:         Neurologic    Sudden weakness in arms or legs:     Sudden numbness in arms or legs:     Sudden onset of difficulty speaking or slurred speech:    Temporary loss of vision in one eye:     Problems with dizziness:         Gastrointestinal    Blood in stool:     Vomited blood:         Genitourinary    Burning when urinating:     Blood in urine:        Psychiatric    Major depression:         Hematologic    Bleeding problems:    Problems with blood clotting too easily:        Skin    Rashes or ulcers:        Constitutional    Fever or chills:     PHYSICAL EXAM:   Vitals:   12/10/21 1526  BP: 113/72  Pulse: 71  Resp: 16  Temp: 97.6 F (36.4 C)  TempSrc: Temporal  SpO2: 97%  Weight: 234 lb (106.1 kg)  Height: 5\' 3"  (1.6 m)    GENERAL: The patient is a well-nourished female, in no acute distress. The vital signs are documented above. CARDIAC: There is a regular rate and rhythm.  VASCULAR: I do not detect carotid bruits. She has palpable pedal pulses.  She has a biphasic dorsalis pedis and posterior tibial signal bilaterally.  She has mild bilateral lower extremity swelling. She has some dilated varicose veins in her medial right calf as documented in the photograph below.   I did look at her right  great saphenous vein myself with the SonoSite.  She has reflux from the saphenofemoral junction down to the knee.  The vein is significantly dilated with diameters of 6 to 7 mm.  Of note, the vein exits the fascia in the mid thigh and in some areas in the distal thigh becomes quite superficial.  I would likely cannulate the vein in the mid to distal thigh but try to avoid the area where the vein is too superficial.  PULMONARY: There is good air exchange bilaterally without wheezing or rales. ABDOMEN: Soft and non-tender with normal pitched bowel sounds.  MUSCULOSKELETAL: There are no major deformities or cyanosis. NEUROLOGIC: No focal weakness or paresthesias are detected. SKIN: There are no ulcers or rashes noted. PSYCHIATRIC: The patient has a normal affect.  DATA:    VENOUS DUPLEX: I have independently interpreted the venous duplex scan that was done on 04/01/2021.  This was of the right lower extremity only.  There was no evidence of DVT.  There was deep venous reflux in the common femoral vein.  There was superficial venous reflux in the right great saphenous vein from the saphenofemoral junction to the knee.  Diameters of the vein ranged from 6 to 7 mm.     Deitra Mayo [facility] [phone]

## 2021-12-19 IMAGING — DX DG ANKLE COMPLETE 3+V*R*
3 series · 3 of 3 positions shown · non-contrast
Comparison: None.

CLINICAL DATA: Pain and swelling over the last week. Symptoms worse
along the medial side

EXAM:
RIGHT ANKLE - COMPLETE 3+ VIEW

[ankle ap]
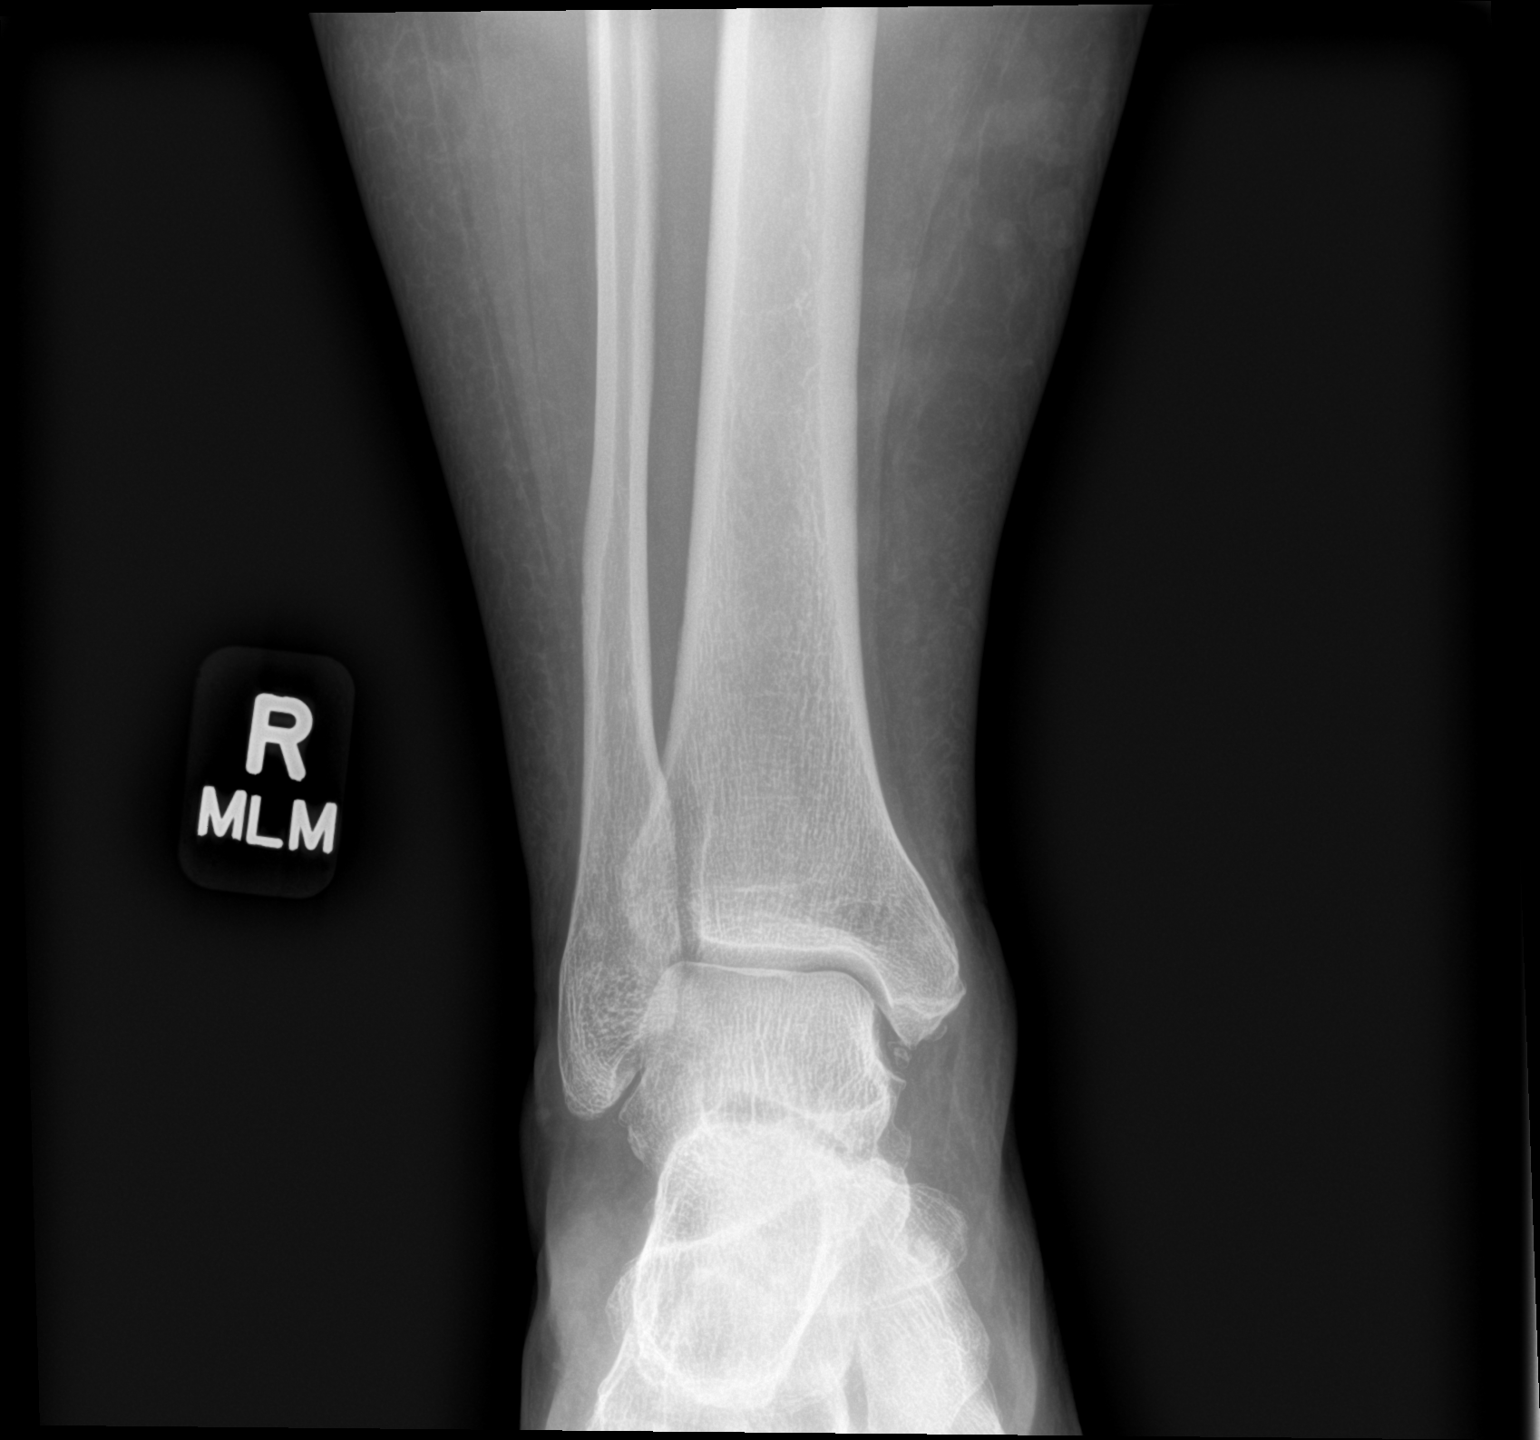

[ankle obl]
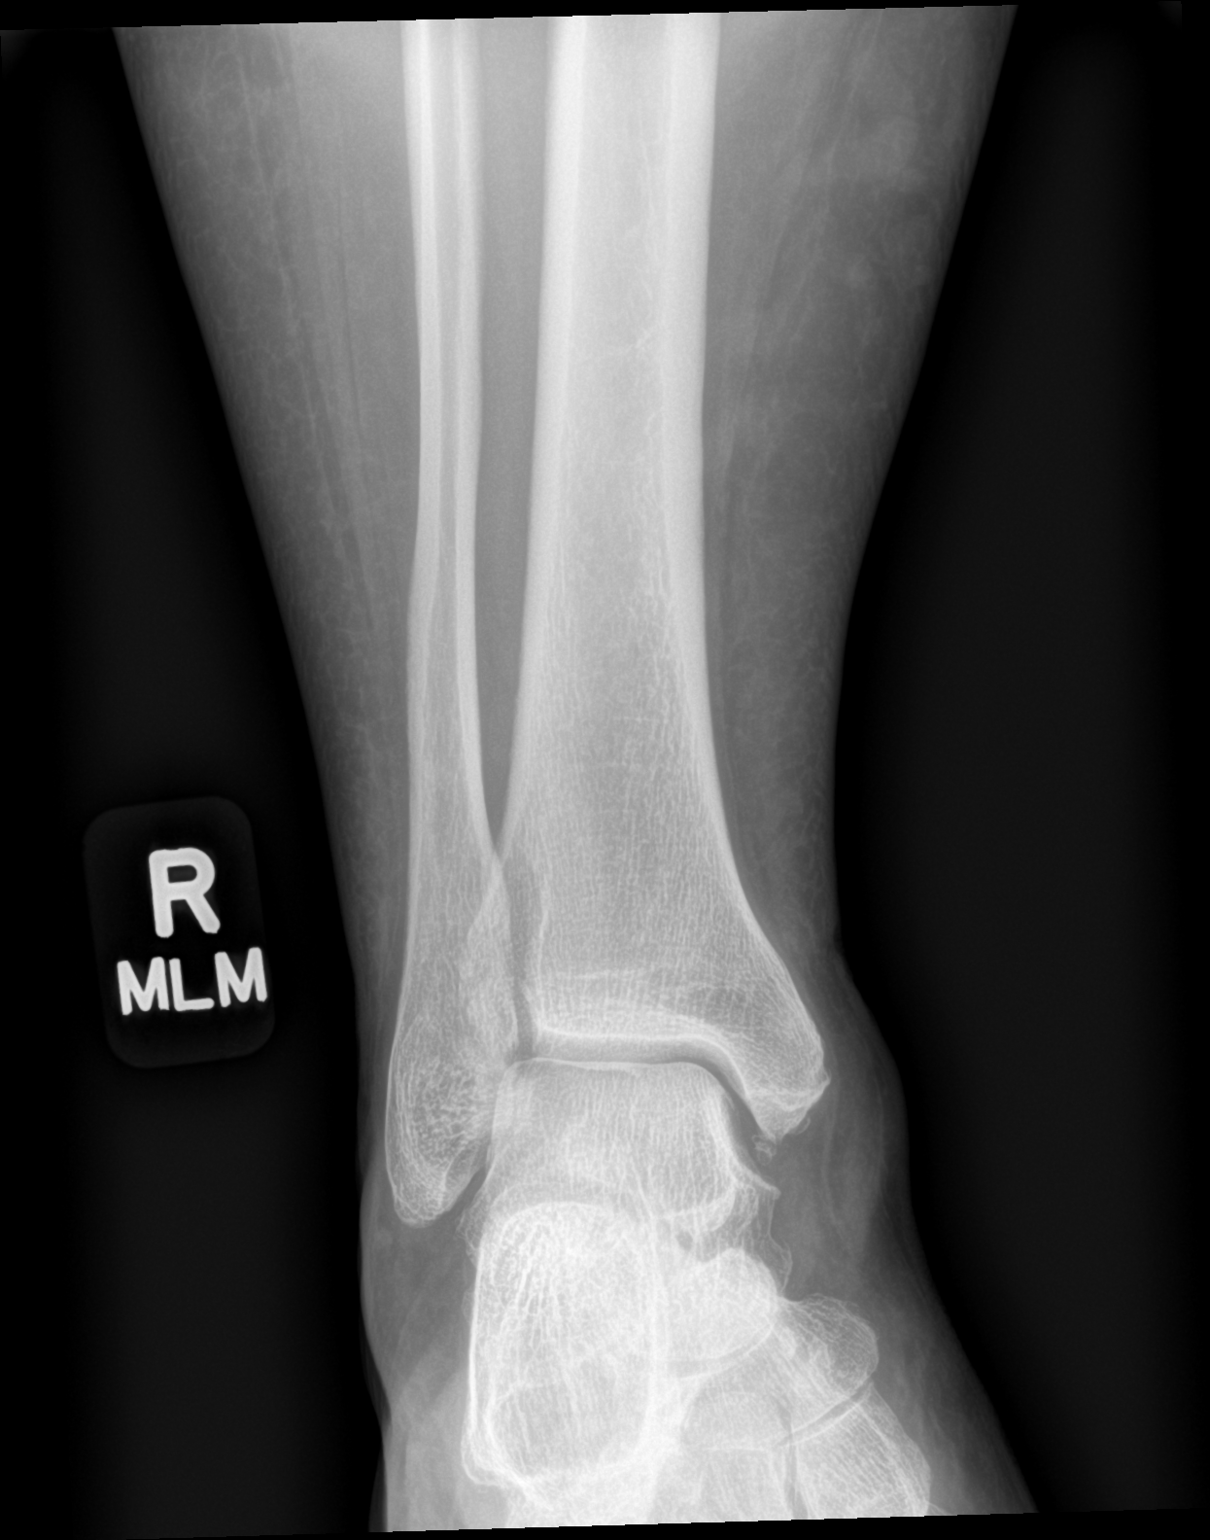

[ankle lat]
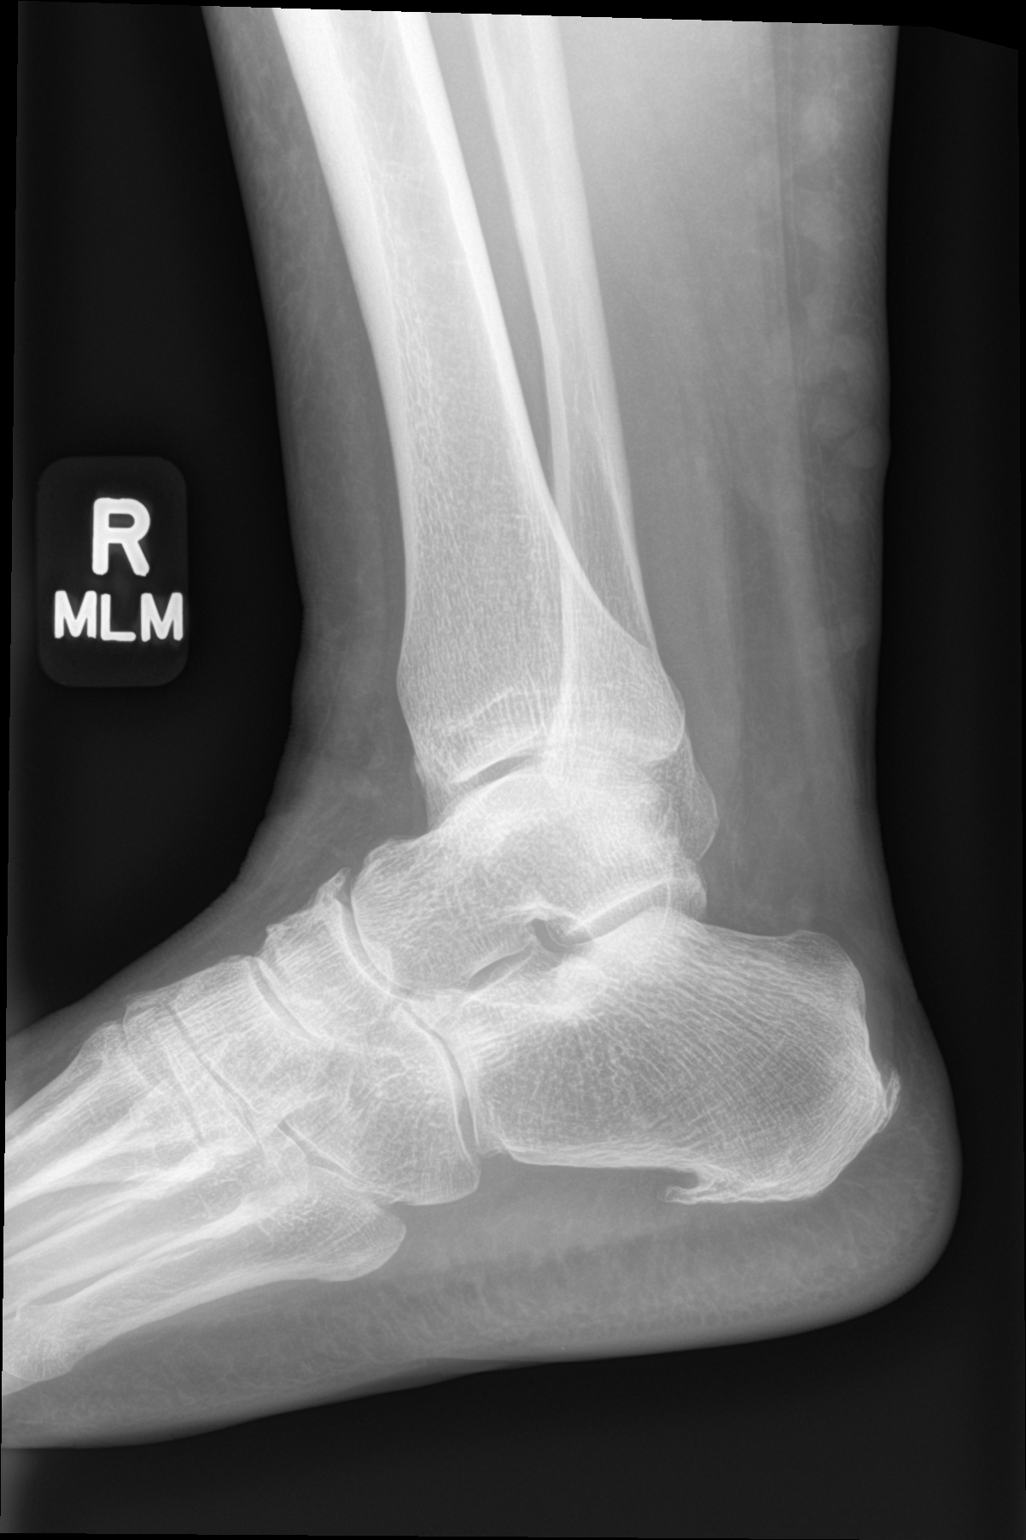

[3 of 3 positions shown; findings below may reference images not displayed]

FINDINGS: No visible joint effusion. Tibiotalar articulation appears normal.
There is medial soft tissue swelling which is nonspecific. Small
corticated calcifications inferior to the medial malleolus are
probably secondary to distant ligamentous injuries. Calcaneal
spurring is present. There is also spurring at the talonavicular
joint of the midfoot. Numerous venous varicosities are visible.
IMPRESSION: Medial soft tissue swelling which is nonspecific. No sign of acute
fracture. No visible joint effusion. Small corticated calcifications
inferior to the medial malleolus are probably secondary to distant
ligamentous injuries. Calcaneal spurring.

## 2021-12-29 ENCOUNTER — Other Ambulatory Visit: Payer: Self-pay | Admitting: *Deleted

## 2021-12-29 DIAGNOSIS — I83811 Varicose veins of right lower extremities with pain: Secondary | ICD-10-CM

## 2021-12-31 ENCOUNTER — Telehealth: Payer: 59 | Admitting: Physician Assistant

## 2021-12-31 DIAGNOSIS — B9689 Other specified bacterial agents as the cause of diseases classified elsewhere: Secondary | ICD-10-CM | POA: Diagnosis not present

## 2021-12-31 DIAGNOSIS — J019 Acute sinusitis, unspecified: Secondary | ICD-10-CM | POA: Diagnosis not present

## 2021-12-31 MED ORDER — AMOXICILLIN-POT CLAVULANATE 875-125 MG PO TABS: 1.0000 | ORAL_TABLET | Freq: Two times a day (BID) | ORAL | 0 refills | Status: DC

## 2021-12-31 NOTE — Progress Notes (Signed)
Virtual Visit Consent   Michelle Ray, you are scheduled for a virtual visit with a [facility] provider today.     Just as with appointments in the office, your consent must be obtained to participate.  Your consent will be active for this visit and any virtual visit you [place] have with one of our providers in the next 365 days.     If you have a MyChart account, a copy of this consent can be sent to you electronically.  All virtual visits are billed to your insurance company just like a traditional visit in the office.    As this is a virtual visit, video technology does not allow for your provider to perform a traditional examination.  This [place] limit your provider's ability to fully assess your condition.  If your provider identifies any concerns that need to be evaluated in person or the need to arrange testing (such as labs, EKG, etc.), we will make arrangements to do so.     Although advances in technology are sophisticated, we cannot ensure that it will always work on either your end or our end.  If the connection with a video visit is poor, the visit [place] have to be switched to a telephone visit.  With either a video or telephone visit, we are not always able to ensure that we have a secure connection.     I need to obtain your verbal consent now.   Are you willing to proceed with your visit today?    Barrie Helseth has provided verbal consent on 12/31/2021 for a virtual visit (video or telephone).   Leeanne Rio, [place]   Date: 12/31/2021 10:40 AM   Virtual Visit via Video Note   I, Leeanne Rio, connected with  Michelle Ray  ([id number], 1966-11-23) on 12/31/21 at 10:30 AM EST by a video-enabled telemedicine application and verified that I am speaking with the correct person using two identifiers.  Location: Patient: Virtual Visit Location Patient: Home Provider: Virtual Visit Location Provider: Home Office   I discussed the limitations of evaluation and management by  telemedicine and the availability of in person appointments. The patient expressed understanding and agreed to proceed.    History of Present Illness: Michelle Ray is a 56 y.o. who identifies as a female who was assigned female at birth, and is being seen today for URI symptoms. Notes that she has a worsening of symptoms yesterday with fever,  increased nasal congestion, head congestion, cough. Fever has resolved as of this morning. Cough has worsened and is now more productive with yellow/green phlegm. Is associated with maxillary sinus pain and upper teeth pain. Spoke with [facility] who recommended COVID test. This was negative. She does note some milder symptoms ongoing from last week prior to worsening yesterday. Has taken OTC medications without much relief.   HPI: HPI  Problems:  Patient Active Problem List   Diagnosis Date Noted   Small right kidney 08/29/2021   Atrophy of right kidney 08/29/2021   Acute gout involving toe of left foot 04/25/2021   Varicose veins of both legs with edema 02/17/2021   Stress incontinence in female 02/17/2021   Pain and swelling of right ankle 02/17/2021   Controlled type 2 diabetes mellitus with complication, without long-term current use of insulin ([place]) 02/17/2021   Mixed hyperlipidemia 02/17/2021   Right upper quadrant abdominal pain 02/17/2021   BMI 40.0-44.9, adult ([place]) 02/17/2021   Aortic atherosclerosis ([place]) 02/17/2021   Chronic thumb pain, bilateral  02/17/2021   Vomiting and diarrhea 05/29/2016   Diarrhea 05/29/2016   HTN (hypertension), benign 05/29/2016    Allergies:  Allergies  Allergen Reactions   Macrobid [Nitrofurantoin]    Pravastatin     Joint pain   Medications:  Current Outpatient Medications:    amoxicillin-clavulanate (AUGMENTIN) 875-125 MG tablet, Take 1 tablet by mouth 2 (two) times daily., Disp: 14 tablet, Rfl: 0   AMBULATORY NON FORMULARY MEDICATION, IBgard Take 1-2 capsules by mouth three times daily as needed., Disp: 12  capsule, Rfl: 0   amLODipine (NORVASC) 10 MG tablet, TAKE 1 TABLET BY MOUTH ONCE A DAY, Disp: 90 tablet, Rfl: 1   benazepril (LOTENSIN) 20 MG tablet, TAKE 1 TABLET BY MOUTH ONCE A DAY, Disp: 90 tablet, Rfl: 1   citalopram (CELEXA) 20 MG tablet, Take 1 tablet by mouth at bedtime., Disp: 90 tablet, Rfl: 1   esomeprazole (NEXIUM) 40 MG capsule, TAKE 1 CAPSULE BY MOUTH ONCE A DAY, Disp: 90 capsule, Rfl: 1   ezetimibe (ZETIA) 10 MG tablet, Take 1 tablet (10 mg total) by mouth daily., Disp: 90 tablet, Rfl: 1   furosemide (LASIX) 40 MG tablet, TAKE 1 TABLET BY MOUTH ONCE A DAY, Disp: 90 tablet, Rfl: 1   ondansetron (ZOFRAN) 4 MG tablet, TAKE 1 TABLET BY MOUTH EVERY 6 TO 8 HOURS AS NEEDED, Disp: 30 tablet, Rfl: 0   potassium chloride SA (KLOR-CON M) 20 MEQ tablet, Take 1 tablet by mouth twice a day for 1 week then once a day. Recheck in 4 weeks., Disp: 37 tablet, Rfl: 2   Semaglutide-Weight Management 2.4 MG/0.75ML SOAJ, Inject 2.4 mg into the skin once a week., Disp: 3 mL, Rfl: 6   triamterene-hydrochlorothiazide (MAXZIDE-25) 37.5-25 MG tablet, TAKE 1 TABLET BY MOUTH ONCE A DAY, Disp: 90 tablet, Rfl: 1  Observations/Objective: Patient is well-developed, well-nourished in no acute distress.  Resting comfortably at home.  Head is normocephalic, atraumatic.  No labored breathing. Speech is clear and coherent with logical content.  Patient is alert and oriented at baseline.   Assessment and Plan: 1. Acute bacterial sinusitis - amoxicillin-clavulanate (AUGMENTIN) 875-125 MG tablet; Take 1 tablet by mouth 2 (two) times daily.  Dispense: 14 tablet; Refill: 0  Negative COVID test. Double worsening of symptoms raising concern of bacterial etiology. Rx Augmentin.  Increase fluids.  Rest.  Saline nasal spray.  Probiotic.  Mucinex as directed.  Humidifier in bedroom. Flonase as directed.  Call or return to clinic if symptoms are not improving.   Follow Up Instructions: I discussed the assessment and  treatment plan with the patient. The patient was provided an opportunity to ask questions and all were answered. The patient agreed with the plan and demonstrated an understanding of the instructions.  A copy of instructions were sent to the patient via MyChart unless otherwise noted below.   The patient was advised to call back or seek an in-person evaluation if the symptoms worsen or if the condition fails to improve as anticipated.  Time:  I spent 10 minutes with the patient via telehealth technology discussing the above problems/concerns.    Leeanne Rio, PA-C

## 2021-12-31 NOTE — Patient Instructions (Signed)
Michelle Ray, thank you for joining Leeanne Rio, PA-C for today's virtual visit.  While this provider is not your primary care provider (PCP), if your PCP is located in our provider database this encounter information will be shared with them immediately following your visit.  Consent: (Patient) Michelle Ray provided verbal consent for this virtual visit at the beginning of the encounter.  Current Medications:  Current Outpatient Medications:    AMBULATORY NON FORMULARY MEDICATION, IBgard Take 1-2 capsules by mouth three times daily as needed., Disp: 12 capsule, Rfl: 0   amLODipine (NORVASC) 10 MG tablet, TAKE 1 TABLET BY MOUTH ONCE A DAY, Disp: 90 tablet, Rfl: 1   benazepril (LOTENSIN) 20 MG tablet, TAKE 1 TABLET BY MOUTH ONCE A DAY, Disp: 90 tablet, Rfl: 1   citalopram (CELEXA) 20 MG tablet, Take 1 tablet by mouth at bedtime., Disp: 90 tablet, Rfl: 1   esomeprazole (NEXIUM) 40 MG capsule, TAKE 1 CAPSULE BY MOUTH ONCE A DAY, Disp: 90 capsule, Rfl: 1   ezetimibe (ZETIA) 10 MG tablet, Take 1 tablet (10 mg total) by mouth daily., Disp: 90 tablet, Rfl: 1   furosemide (LASIX) 40 MG tablet, TAKE 1 TABLET BY MOUTH ONCE A DAY, Disp: 90 tablet, Rfl: 1   ondansetron (ZOFRAN) 4 MG tablet, TAKE 1 TABLET BY MOUTH EVERY 6 TO 8 HOURS AS NEEDED, Disp: 30 tablet, Rfl: 0   potassium chloride SA (KLOR-CON M) 20 MEQ tablet, Take 1 tablet by mouth twice a day for 1 week then once a day. Recheck in 4 weeks., Disp: 37 tablet, Rfl: 2   Semaglutide-Weight Management 2.4 MG/0.75ML SOAJ, Inject 2.4 mg into the skin once a week., Disp: 3 mL, Rfl: 6   triamterene-hydrochlorothiazide (MAXZIDE-25) 37.5-25 MG tablet, TAKE 1 TABLET BY MOUTH ONCE A DAY, Disp: 90 tablet, Rfl: 1   Medications ordered in this encounter:  No orders of the defined types were placed in this encounter.    *If you need refills on other medications prior to your next appointment, please contact your pharmacy*  Follow-Up: Call back or seek  an in-person evaluation if the symptoms worsen or if the condition fails to improve as anticipated.  Other Instructions Please take antibiotic as directed.  Increase fluid intake.  Use Saline nasal spray.  Take a daily multivitamin. Flonase OTC daily.  Place a humidifier in the bedroom.  Please call or return clinic if symptoms are not improving.  Sinusitis Sinusitis is redness, soreness, and swelling (inflammation) of the paranasal sinuses. Paranasal sinuses are air pockets within the bones of your face (beneath the eyes, the middle of the forehead, or above the eyes). In healthy paranasal sinuses, mucus is able to drain out, and air is able to circulate through them by way of your nose. However, when your paranasal sinuses are inflamed, mucus and air can become trapped. This can allow bacteria and other germs to grow and cause infection. Sinusitis can develop quickly and last only a short time (acute) or continue over a long period (chronic). Sinusitis that lasts for more than 12 weeks is considered chronic.  CAUSES  Causes of sinusitis include: Allergies. Structural abnormalities, such as displacement of the cartilage that separates your nostrils (deviated septum), which can decrease the air flow through your nose and sinuses and affect sinus drainage. Functional abnormalities, such as when the small hairs (cilia) that line your sinuses and help remove mucus do not work properly or are not present. SYMPTOMS  Symptoms of acute and chronic sinusitis  are the same. The primary symptoms are pain and pressure around the affected sinuses. Other symptoms include: Upper toothache. Earache. Headache. Bad breath. Decreased sense of smell and taste. A cough, which worsens when you are lying flat. Fatigue. Fever. Thick drainage from your nose, which often is green and [place] contain pus (purulent). Swelling and warmth over the affected sinuses. DIAGNOSIS  Your caregiver will perform a physical exam.  During the exam, your caregiver [place]: Look in your nose for signs of abnormal growths in your nostrils (nasal polyps). Tap over the affected sinus to check for signs of infection. View the inside of your sinuses (endoscopy) with a special imaging device with a light attached (endoscope), which is inserted into your sinuses. If your caregiver suspects that you have chronic sinusitis, one or more of the following tests [place] be recommended: Allergy tests. Nasal culture A sample of mucus is taken from your nose and sent to a lab and screened for bacteria. Nasal cytology A sample of mucus is taken from your nose and examined by your caregiver to determine if your sinusitis is related to an allergy. TREATMENT  Most cases of acute sinusitis are related to a viral infection and will resolve on their own within 10 days. Sometimes medicines are prescribed to help relieve symptoms (pain medicine, decongestants, nasal steroid sprays, or saline sprays).  However, for sinusitis related to a bacterial infection, your caregiver will prescribe antibiotic medicines. These are medicines that will help kill the bacteria causing the infection.  Rarely, sinusitis is caused by a fungal infection. In theses cases, your caregiver will prescribe antifungal medicine. For some cases of chronic sinusitis, surgery is needed. Generally, these are cases in which sinusitis recurs more than 3 times per year, despite other treatments. HOME CARE INSTRUCTIONS  Drink plenty of water. Water helps thin the mucus so your sinuses can drain more easily. Use a humidifier. Inhale steam 3 to 4 times a day (for example, sit in the bathroom with the shower running). Apply a warm, moist washcloth to your face 3 to 4 times a day, or as directed by your caregiver. Use saline nasal sprays to help moisten and clean your sinuses. Take over-the-counter or prescription medicines for pain, discomfort, or fever only as directed by your caregiver. SEEK  IMMEDIATE MEDICAL CARE IF: You have increasing pain or severe headaches. You have nausea, vomiting, or drowsiness. You have swelling around your face. You have vision problems. You have a stiff neck. You have difficulty breathing. MAKE SURE YOU:  Understand these instructions. Will watch your condition. Will get help right away if you are not doing well or get worse. Document Released: 11/16/2005 Document Revised: 02/08/2012 Document Reviewed: 12/01/2011 [facility] Patient Information 2014 [place], [place].    If you have been instructed to have an in-person evaluation today at a local Urgent Care facility, please use the link below. It will take you to a list of all of our available [facility], including address, phone number and hours of operation. Please do not delay care.  [facility]  If you or a family member do not have a primary care provider, use the link below to schedule a visit and establish care. When you choose a [employer] primary care physician or advanced practice provider, you gain a long-term partner in health. Find a Primary Care Provider  Learn more about [facility] in-office and virtual care options: [facility]

## 2022-01-07 ENCOUNTER — Ambulatory Visit (HOSPITAL_BASED_OUTPATIENT_CLINIC_OR_DEPARTMENT_OTHER): Payer: 59 | Admitting: Nurse Practitioner

## 2022-01-11 ENCOUNTER — Other Ambulatory Visit (HOSPITAL_BASED_OUTPATIENT_CLINIC_OR_DEPARTMENT_OTHER): Payer: Self-pay | Admitting: Nurse Practitioner

## 2022-01-11 DIAGNOSIS — I10 Essential (primary) hypertension: Secondary | ICD-10-CM

## 2022-01-11 DIAGNOSIS — Z6841 Body Mass Index (BMI) 40.0 and over, adult: Secondary | ICD-10-CM

## 2022-01-11 DIAGNOSIS — E118 Type 2 diabetes mellitus with unspecified complications: Secondary | ICD-10-CM

## 2022-01-11 DIAGNOSIS — I700 Atherosclerosis of aorta: Secondary | ICD-10-CM

## 2022-01-11 DIAGNOSIS — E782 Mixed hyperlipidemia: Secondary | ICD-10-CM

## 2022-01-12 ENCOUNTER — Other Ambulatory Visit (HOSPITAL_COMMUNITY): Payer: Self-pay

## 2022-01-12 MED ORDER — WEGOVY 2.4 MG/0.75ML ~~LOC~~ SOAJ
2.4000 mg | SUBCUTANEOUS | 6 refills | Status: DC
  Filled 2022-01-12: qty 3, 28d supply, fill #0
  Filled 2022-02-20: qty 3, 28d supply, fill #1

## 2022-01-12 NOTE — Telephone Encounter (Signed)
Called & LM for patient to give me a call back regarding refill request

## 2022-01-16 ENCOUNTER — Other Ambulatory Visit (HOSPITAL_COMMUNITY): Payer: Self-pay

## 2022-01-21 ENCOUNTER — Other Ambulatory Visit: Payer: Self-pay | Admitting: *Deleted

## 2022-01-21 ENCOUNTER — Other Ambulatory Visit (HOSPITAL_COMMUNITY): Payer: Self-pay

## 2022-01-21 MED ORDER — LORAZEPAM 1 MG PO TABS: ORAL_TABLET | ORAL | 0 refills | Status: DC

## 2022-01-21 MED ORDER — LORAZEPAM 1 MG PO TABS
2.0000 mg | ORAL_TABLET | ORAL | 0 refills | Status: DC
  Filled 2022-01-21: qty 2, 1d supply, fill #0

## 2022-01-28 ENCOUNTER — Encounter: Payer: Self-pay | Admitting: Vascular Surgery

## 2022-01-28 ENCOUNTER — Ambulatory Visit (INDEPENDENT_AMBULATORY_CARE_PROVIDER_SITE_OTHER): Payer: 59 | Admitting: Vascular Surgery

## 2022-01-28 ENCOUNTER — Other Ambulatory Visit: Payer: Self-pay

## 2022-01-28 VITALS — BP 119/72 | HR 69 | Temp 98.8°F | Resp 16 | Ht 63.0 in | Wt 236.0 lb

## 2022-01-28 DIAGNOSIS — I872 Venous insufficiency (chronic) (peripheral): Secondary | ICD-10-CM

## 2022-01-28 DIAGNOSIS — I83811 Varicose veins of right lower extremities with pain: Secondary | ICD-10-CM | POA: Diagnosis not present

## 2022-01-28 HISTORY — PX: ENDOVENOUS ABLATION SAPHENOUS VEIN W/ LASER: SUR449

## 2022-01-28 NOTE — Progress Notes (Signed)
? ?  Date: 01/28/2022 ? ? ?Michelle Ray DOB:04-14-1966 ? ?Consent signed: Yes     ? ?Surgeon: Gae Gallop MD  ? ?Procedure: Laser Ablation: right Greater Saphenous Vein ? ?BP 119/72 (BP Location: Left Arm, Patient Position: Sitting, Cuff Size: Large)   Pulse 69   Temp 98.8 ?F (37.1 ?C) (Temporal)   Resp 16   Ht 5\' 3"  (1.6 m)   Wt 236 lb (107 kg)   SpO2 98%   BMI 41.81 kg/m?  ? ?Tumescent Anesthesia: 475 cc 0.9% NaCl with 50 cc Lidocaine HCL 1%  and 15 cc 8.4% NaHCO3 ? ?Local Anesthesia: 6 cc Lidocaine HCL and NaHCO3 (ratio 2:1) ? ?7 watts continuous mode ?    ?Total energy: 1073 Joules    ?Total time: 153 seconds  ?Treatment Length 20 cm  ? ?Laser Fiber Ref. #   [id number]   Lot #  [id number] ? ? ? ?Stab Phlebectomy: 10-20  Sites: Calf and ankle  ? ?Patient tolerated procedure well ? ?Notes: Patient wore face mask.  All staff members wore facial masks and facial shields/goggles. Mrs. Zender took Ativan 1 mg 2 tablets on 01-28-2022 at 7:20 AM.    ? ?Description of Procedure: ? ?After marking the course of the secondary varicosities, the patient was placed on the operating table in the supine position, and the right leg was prepped and draped in sterile fashion.   Local anesthetic was administered and under ultrasound guidance the saphenous vein was accessed with a micro needle and guide wire; then the mirco puncture sheath was placed.  A guide wire was inserted saphenofemoral junction , followed by a 5 french sheath.  The position of the sheath and then the laser fiber below the junction was confirmed using the ultrasound.  Tumescent anesthesia was administered along the course of the saphenous vein using ultrasound guidance. The patient was placed in Trendelenburg position and protective laser glasses were placed on patient and staff, and the laser was fired at 7 watts continuous mode for a total of 1073 joules. ? ? ?For stab phlebectomies, local anesthetic was administered at the previously marked varicosities, and  tumescent anesthesia was administered around the vessels.  Ten to 20 stab wounds were made using the tip of an 11 blade. And using the vein hook, the phlebectomies were performed using a hemostat to avulse the varicosities.  Adequate hemostasis was achieved.  ? ? ? ?Steri strips were applied to the stab wounds and ABD pads and thigh high compression stockings were applied.  Ace wrap bandages were applied over the phlebectomy sites and at the top of the saphenofemoral junction. Blood loss was less than 15 cc.  Discharge instructions reviewed with patient and hardcopy of discharge instructions given to patient to take home. The patient ambulated out of the operating room having tolerated the procedure well.  ?

## 2022-01-28 NOTE — Progress Notes (Signed)
? ?Patient name: Michelle Ray MRN: [id number] DOB: 1966/10/03 Sex: female ? ?REASON FOR VISIT: For laser ablation of the right great saphenous vein and 10-20 stabs ? ?HPI: ?Michelle Ray is a 56 y.o. female who I last saw on 12/10/2021 with painful varicose veins of the right lower extremity and chronic venous insufficiency.  She had CEAP C2 venous disease.  She had failed conservative treatment and I felt that she was a candidate for laser ablation right great saphenous vein with 10-20 stabs.  The great saphenous vein exited the fascia in the mid thigh and became more superficial in the distal thigh.  The vein was quite large with diameters from 6 to 7 mm.  I felt that I would likely cannulate the vein in the mid to distal thigh. ? ?Current Outpatient Medications  ?Medication Sig Dispense Refill  ? AMBULATORY NON FORMULARY MEDICATION IBgard ?Take 1-2 capsules by mouth three times daily as needed. 12 capsule 0  ? amLODipine (NORVASC) 10 MG tablet TAKE 1 TABLET BY MOUTH ONCE A DAY 90 tablet 1  ? amoxicillin-clavulanate (AUGMENTIN) 875-125 MG tablet Take 1 tablet by mouth 2 (two) times daily. 14 tablet 0  ? benazepril (LOTENSIN) 20 MG tablet TAKE 1 TABLET BY MOUTH ONCE A DAY 90 tablet 1  ? citalopram (CELEXA) 20 MG tablet Take 1 tablet by mouth at bedtime. 90 tablet 1  ? esomeprazole (NEXIUM) 40 MG capsule TAKE 1 CAPSULE BY MOUTH ONCE A DAY 90 capsule 1  ? ezetimibe (ZETIA) 10 MG tablet Take 1 tablet (10 mg total) by mouth daily. 90 tablet 1  ? furosemide (LASIX) 40 MG tablet TAKE 1 TABLET BY MOUTH ONCE A DAY 90 tablet 1  ? LORazepam (ATIVAN) 1 MG tablet Take 2 tablets 30 minutes prior to leaving house on day of office surgery. 2 tablet 0  ? LORazepam (ATIVAN) 1 MG tablet Take 2 tablets (2 mg total) by mouth 30 minutes prior to leaving house on day of office surgery 2 tablet 0  ? ondansetron (ZOFRAN) 4 MG tablet TAKE 1 TABLET BY MOUTH EVERY 6 TO 8 HOURS AS NEEDED 30 tablet 0  ? potassium chloride SA (KLOR-CON M) 20 MEQ  tablet Take 1 tablet by mouth twice a day for 1 week then once a day. Recheck in 4 weeks. 37 tablet 2  ? Semaglutide-Weight Management (WEGOVY) 2.4 MG/0.75ML SOAJ Inject 2.4 mg into the skin once a week. 3 mL 6  ? triamterene-hydrochlorothiazide (MAXZIDE-25) 37.5-25 MG tablet TAKE 1 TABLET BY MOUTH ONCE A DAY 90 tablet 1  ? ?No current facility-administered medications for this visit.  ? ? ?PHYSICAL EXAM: ?Vitals:  ? 01/28/22 0838  ?BP: 119/72  ?Pulse: 69  ?Resp: 16  ?Temp: 98.8 ?F (37.1 ?C)  ?TempSrc: Temporal  ?SpO2: 98%  ?Weight: 236 lb (107 kg)  ?Height: 5\' 3"  (1.6 m)  ? ? ?Body mass index is 41.81 kg/m?. ? ?PROCEDURE: Laser ablation of the right great saphenous vein with 10-20 stabs ? ?TECHNIQUE: The patient was taken to the exam room and the dilated varicose veins were marked with the patient standing.  Patient was then placed supine.  I looked at the right great saphenous vein myself with the SonoSite.  It exited the fascia in the mid thigh.  I felt that I could cannulate this in the distal thigh above the knee before it became too superficial.  The right leg was prepped and draped in usual sterile fashion.  Under ultrasound guidance, after the skin was  anesthetized, I cannulated the right great saphenous vein with a micropuncture needle and a micropuncture sheath was introduced over wire.  I then advanced the J-wire to just below the saphenofemoral junction.  The dilator and peel-away sheath were advanced over the wire and the wire and dilator removed.  The laser fiber was positioned at the end of the sheath and the sheath retracted.  I position this approximately 2.5 cm distal to the saphenofemoral junction.  Tumescent anesthesia was then administered circumferentially around the vein.  The patient was placed in Trendelenburg.  Laser ablation was performed of the right great saphenous vein from 2-1/2 cm distal to the saphenofemoral junction to    the junction of the mid and distal thigh. ? ?Attention was  then turned to stab use.  Tumescent anesthesia was administered all the marked areas.  Making approximately 15 small stab incisions with an 11 blade the vein was hooked and brought above the skin and grasped with a hemostat.  It was bluntly excised.  Pressure was held for hemostasis.  Steri-Strips were applied.  A pressure dressing was applied.  The patient tolerated the procedure well.  She will return in 1 week for a follow-up duplex. ? ?Deitra Mayo ?[facility] ?[phone] ? ? ?

## 2022-02-04 ENCOUNTER — Encounter: Payer: Self-pay | Admitting: Vascular Surgery

## 2022-02-04 ENCOUNTER — Ambulatory Visit (INDEPENDENT_AMBULATORY_CARE_PROVIDER_SITE_OTHER): Payer: 59 | Admitting: Vascular Surgery

## 2022-02-04 ENCOUNTER — Other Ambulatory Visit: Payer: Self-pay

## 2022-02-04 ENCOUNTER — Ambulatory Visit (HOSPITAL_COMMUNITY)
Admission: RE | Admit: 2022-02-04 | Discharge: 2022-02-04 | Disposition: A | Discharge status: Home / Self Care | Payer: 59 | Source: Ambulatory Visit | Attending: Vascular Surgery | Admitting: Vascular Surgery

## 2022-02-04 VITALS — BP 127/84 | HR 64 | Temp 98.4°F | Resp 14

## 2022-02-04 DIAGNOSIS — I872 Venous insufficiency (chronic) (peripheral): Secondary | ICD-10-CM

## 2022-02-04 DIAGNOSIS — I83811 Varicose veins of right lower extremities with pain: Secondary | ICD-10-CM | POA: Insufficient documentation

## 2022-02-04 NOTE — Progress Notes (Signed)
? ?Patient name: Michelle Ray MRN: [id number] DOB: May 14, 1966 Sex: female ? ?REASON FOR VISIT: Follow-up after laser ablation of the right great saphenous vein with 10-20 stabs. ? ?HPI: ?Michelle Ray is a 56 y.o. female who had presented with painful varicose veins of her right lower extremity and chronic venous insufficiency.  She had CEAP C2 venous disease.  On 01/28/2022 she underwent laser ablation of the right great saphenous vein to the mid to distal thigh.  The vein exited the fascia in the mid thigh and became more superficial.  She has no specific complaints today.  She has been wearing her thigh-high stockings. ? ?Current Outpatient Medications  ?Medication Sig Dispense Refill  ? AMBULATORY NON FORMULARY MEDICATION IBgard ?Take 1-2 capsules by mouth three times daily as needed. 12 capsule 0  ? amLODipine (NORVASC) 10 MG tablet TAKE 1 TABLET BY MOUTH ONCE A DAY 90 tablet 1  ? amoxicillin-clavulanate (AUGMENTIN) 875-125 MG tablet Take 1 tablet by mouth 2 (two) times daily. 14 tablet 0  ? benazepril (LOTENSIN) 20 MG tablet TAKE 1 TABLET BY MOUTH ONCE A DAY 90 tablet 1  ? citalopram (CELEXA) 20 MG tablet Take 1 tablet by mouth at bedtime. 90 tablet 1  ? esomeprazole (NEXIUM) 40 MG capsule TAKE 1 CAPSULE BY MOUTH ONCE A DAY 90 capsule 1  ? ezetimibe (ZETIA) 10 MG tablet Take 1 tablet (10 mg total) by mouth daily. 90 tablet 1  ? furosemide (LASIX) 40 MG tablet TAKE 1 TABLET BY MOUTH ONCE A DAY 90 tablet 1  ? LORazepam (ATIVAN) 1 MG tablet Take 2 tablets 30 minutes prior to leaving house on day of office surgery. 2 tablet 0  ? LORazepam (ATIVAN) 1 MG tablet Take 2 tablets (2 mg total) by mouth 30 minutes prior to leaving house on day of office surgery 2 tablet 0  ? ondansetron (ZOFRAN) 4 MG tablet TAKE 1 TABLET BY MOUTH EVERY 6 TO 8 HOURS AS NEEDED 30 tablet 0  ? potassium chloride SA (KLOR-CON M) 20 MEQ tablet Take 1 tablet by mouth twice a day for 1 week then once a day. Recheck in 4 weeks. 37 tablet 2  ?  Semaglutide-Weight Management (WEGOVY) 2.4 MG/0.75ML SOAJ Inject 2.4 mg into the skin once a week. 3 mL 6  ? triamterene-hydrochlorothiazide (MAXZIDE-25) 37.5-25 MG tablet TAKE 1 TABLET BY MOUTH ONCE A DAY 90 tablet 1  ? ?No current facility-administered medications for this visit.  ? ?REVIEW OF SYSTEMS: Valu.Nieves ] denotes positive finding; [  ] denotes negative finding  ?CARDIOVASCULAR:  '[ ]'$  chest pain   '[ ]'$  dyspnea on exertion  '[ ]'$  leg swelling  ?CONSTITUTIONAL:  '[ ]'$  fever   '[ ]'$  chills ? ?PHYSICAL EXAM: ? ?Vitals:  ? 02/04/22 1038  ?BP: 127/84  ?Pulse: 64  ?Resp: 14  ?Temp: 98.4 ?F (36.9 ?C)  ?TempSrc: Temporal  ?SpO2: 98%  ? ? ?There is no height or weight on file to calculate BMI. ? ?GENERAL: The patient is a well-nourished female, in no acute distress. The vital signs are documented above. ?CARDIOVASCULAR: There is a regular rate and rhythm. ?PULMONARY: There is good air exchange bilaterally without wheezing or rales. ?VASCULAR: She has no significant bruising.  She has no significant leg swelling.  Incisions are healing nicely. ? ?DATA: ? ?VENOUS DUPLEX: I have independently interpreted her venous duplex scan today.  This shows no evidence of DVT in the right lower extremity.  The right great saphenous vein is successfully closed from  3.1 cm distal to the saphenofemoral junction to the knee. ? ?MEDICAL ISSUES: ? ?S/P LASER ABLATION RIGHT GREAT SAPHENOUS VEIN WITH 10-20 STABS: The patient is doing well status post laser ablation of the right great saphenous vein with stab phlebectomies.  She has 1 more week with her thigh-high compression stockings.  I encouraged her to elevate her legs daily.  We also discussed the importance of exercise and continued use of compression therapy.  I will see her back as needed. ? ?Michelle Ray ?[facility] ?[phone] ? ? ?

## 2022-02-20 ENCOUNTER — Other Ambulatory Visit (HOSPITAL_COMMUNITY): Payer: Self-pay

## 2022-02-20 ENCOUNTER — Other Ambulatory Visit (HOSPITAL_BASED_OUTPATIENT_CLINIC_OR_DEPARTMENT_OTHER): Payer: Self-pay | Admitting: Nurse Practitioner

## 2022-02-22 ENCOUNTER — Other Ambulatory Visit (HOSPITAL_COMMUNITY): Payer: Self-pay

## 2022-02-22 MED ORDER — ESOMEPRAZOLE MAGNESIUM 40 MG PO CPDR
DELAYED_RELEASE_CAPSULE | Freq: Every day | ORAL | 1 refills | Status: DC
  Filled 2022-02-22: qty 90, 90d supply, fill #0
  Filled 2022-05-26: qty 90, 90d supply, fill #1

## 2022-02-23 ENCOUNTER — Other Ambulatory Visit (HOSPITAL_COMMUNITY): Payer: Self-pay

## 2022-02-27 ENCOUNTER — Ambulatory Visit (INDEPENDENT_AMBULATORY_CARE_PROVIDER_SITE_OTHER): Payer: 59 | Admitting: Nurse Practitioner

## 2022-02-27 ENCOUNTER — Encounter (HOSPITAL_BASED_OUTPATIENT_CLINIC_OR_DEPARTMENT_OTHER): Payer: Self-pay | Admitting: Nurse Practitioner

## 2022-02-27 ENCOUNTER — Other Ambulatory Visit (HOSPITAL_COMMUNITY): Payer: Self-pay

## 2022-02-27 VITALS — BP 117/82 | HR 86 | Temp 98.7°F | Ht 63.0 in | Wt 240.0 lb

## 2022-02-27 DIAGNOSIS — Z6841 Body Mass Index (BMI) 40.0 and over, adult: Secondary | ICD-10-CM

## 2022-02-27 DIAGNOSIS — G2581 Restless legs syndrome: Secondary | ICD-10-CM | POA: Diagnosis not present

## 2022-02-27 DIAGNOSIS — E782 Mixed hyperlipidemia: Secondary | ICD-10-CM | POA: Diagnosis not present

## 2022-02-27 DIAGNOSIS — E785 Hyperlipidemia, unspecified: Secondary | ICD-10-CM

## 2022-02-27 DIAGNOSIS — E118 Type 2 diabetes mellitus with unspecified complications: Secondary | ICD-10-CM | POA: Diagnosis not present

## 2022-02-27 DIAGNOSIS — E1169 Type 2 diabetes mellitus with other specified complication: Secondary | ICD-10-CM

## 2022-02-27 DIAGNOSIS — I152 Hypertension secondary to endocrine disorders: Secondary | ICD-10-CM

## 2022-02-27 DIAGNOSIS — M797 Fibromyalgia: Secondary | ICD-10-CM | POA: Diagnosis not present

## 2022-02-27 DIAGNOSIS — I10 Essential (primary) hypertension: Secondary | ICD-10-CM | POA: Diagnosis not present

## 2022-02-27 DIAGNOSIS — I700 Atherosclerosis of aorta: Secondary | ICD-10-CM | POA: Diagnosis not present

## 2022-02-27 DIAGNOSIS — E1159 Type 2 diabetes mellitus with other circulatory complications: Secondary | ICD-10-CM | POA: Diagnosis not present

## 2022-02-27 MED ORDER — DULOXETINE HCL 20 MG PO CPEP
ORAL_CAPSULE | ORAL | 3 refills | Status: DC
Start: 2022-02-27 — End: 2022-08-28
  Filled 2022-02-27: qty 60, 30d supply, fill #0
  Filled 2022-04-07: qty 60, 30d supply, fill #1
  Filled 2022-05-16: qty 60, 30d supply, fill #2
  Filled 2022-06-25: qty 60, 30d supply, fill #3

## 2022-02-27 MED ORDER — CARBIDOPA-LEVODOPA 25-250 MG PO TABS
1.0000 | ORAL_TABLET | Freq: Every day | ORAL | 3 refills | Status: DC
  Filled 2022-02-27: qty 30, 30d supply, fill #0
  Filled 2022-04-07: qty 30, 30d supply, fill #1
  Filled 2022-04-30: qty 30, 30d supply, fill #2
  Filled 2022-08-28: qty 30, 30d supply, fill #3

## 2022-02-27 MED ORDER — TIRZEPATIDE 7.5 MG/0.5ML ~~LOC~~ SOAJ
7.5000 mg | SUBCUTANEOUS | 1 refills | Status: DC
  Filled 2022-02-27 – 2022-04-07 (×2): qty 6, 84d supply, fill #0

## 2022-02-27 MED ORDER — CYCLOBENZAPRINE HCL 10 MG PO TABS
ORAL_TABLET | ORAL | 6 refills | Status: DC
  Filled 2022-02-27: qty 90, 30d supply, fill #0

## 2022-02-27 MED ORDER — TIRZEPATIDE 5 MG/0.5ML ~~LOC~~ SOAJ
5.0000 mg | SUBCUTANEOUS | 0 refills | Status: DC
  Filled 2022-02-27: qty 2, 28d supply, fill #0

## 2022-02-27 NOTE — Patient Instructions (Signed)
It was a pleasure seeing you today. I hope your time spent with [place] was pleasant and helpful. Please let us know if there is anything we can do to improve the service you receive.  ? ?Today we discussed concerns with: ? ?RLS (restless legs syndrome) ? ?Fibromyalgia muscle pain ? ?Controlled type 2 diabetes mellitus with complication, without long-term current use of insulin ([place]) ? ?HTN (hypertension), benign ? ?Mixed hyperlipidemia ? ?BMI 40.0-44.9, adult ([place]) ? ?Aortic atherosclerosis ([place]) ? ? ?The following orders have been placed for you today: ?Mounjaro start with '5mg'$  once a week for 4 weeks then increase to 7.'5mg'$  once a week. We can always go up on this if needed.  ? ?I have stopped the celexa and we will start cymbalta. Start with '20mg'$  daily and increase to '40mg'$  after 7 days.  ? ?I have sent in the flexeril in for you to try for the pain and carbidopa-levodopa for the restless legs.  ?  ? ?Orders Placed This Encounter  ?Procedures  ? CBC with Differential/Platelet  ?  Order Specific Question:   Release to patient  ?  Answer:   Immediate  ? Comprehensive metabolic panel  ?  Order Specific Question:   Has the patient fasted?  ?  Answer:   Yes  ?  Order Specific Question:   Release to patient  ?  Answer:   Immediate  ? Lipid panel  ?  Order Specific Question:   Has the patient fasted?  ?  Answer:   Yes  ?  Order Specific Question:   Release to patient  ?  Answer:   Immediate  ? VITAMIN D 25 Hydroxy (Vit-D Deficiency, Fractures)  ?  Order Specific Question:   Release to patient  ?  Answer:   Immediate  ? Hemoglobin A1c  ?  Order Specific Question:   Release to patient  ?  Answer:   Immediate  ? ? ? ?Important Office Information ?Lab Results ?If labs were ordered, please note that you will see results through [place] as soon as they come available from [place].  ?It takes up to 5 business days for the results to be routed to me and for me to review them once all of the lab results have come through from [facility].  I will make recommendations based on your results and send these through [place] or someone from the office will call you to discuss. If your labs are abnormal, we [place] contact you to schedule a visit to discuss the results and make recommendations.  ?If you have not heard from [place] within 5 business days or you have waited longer than a week and your lab results have not come through on [place], please feel free to call the office or send a message through [place] to follow-up on these labs.  ? ?Referrals ?If referrals were placed today, the office where the referral was sent will contact you either by phone or through [place] to set up scheduling. Please note that it can take up to a week for the referral office to contact you. If you do not hear from them in a week, please contact the referral office directly to inquire about scheduling.  ? ?Condition Treated ?If your condition worsens or you begin to have new symptoms, please schedule a follow-up appointment for further evaluation. If you are not sure if an appointment is needed, you [place] call the office to leave a message for the nurse and someone will contact you with  recommendations.  ?If you have an urgent or life threatening emergency, please do not call the office, but seek emergency evaluation by calling 911 or going to the nearest emergency room for evaluation.  ? ?MyChart and Phone Calls ?Please do not use MyChart for urgent messages. It Jue take up to 3 business days for MyChart messages to be read by staff and if they are unable to handle the request, an additional 3 business days for them to be routed to me and for my response.  ?Messages sent to the provider through [place] do not come directly to the provider, please allow time for these messages to be routed and for me to respond.  ?We get a large volume of MyChart messages daily and these are responded to in the order received.  ? ?For urgent messages, please call the office at [phone] and speak  with the front office staff or leave a message on the line of my assistant for guidance.  ?We are seeing patients from the hours of 8:00 am through 5:00 pm and calls directly to the nurse [place] not be answered immediately due to seeing patients, but your call will be returned as soon as possible.  ?Phone  messages received after 4:00 PM Monday through Thursday [place] not be returned until the following business day. Phone messages received after 11:00 AM on Friday [place] not be returned until Monday.  ? ?After Hours ?We share on call hours with providers from other offices. If you have an urgent need after hours that cannot wait until the next business day, please contact the on call provider by calling the office number. A nurse will speak with you and contact the provider if needed for recommendations.  ?If you have an urgent or life threatening emergency after hours, please do not call the on call provider, but seek emergency evaluation by calling 911 or going to the nearest emergency room for evaluation.  ? ?Paperwork ?All paperwork requires a minimum of 5 days to complete and return to you or the designated personnel. Please keep this in mind when bringing in forms or sending requests for paperwork completion to the office.  ?  ?

## 2022-02-27 NOTE — Progress Notes (Signed)
? ?Established Patient Office Visit ? ?Subjective:  ?Patient ID: Michelle Ray, female    DOB: 1966-07-08  Age: 56 y.o. MRN: [id number] ? ?CC:  ?Chief Complaint  ?Patient presents with  ? Diabetes  ? Hypertension  ? ? ?HPI ?Michelle Ray presents for f/u diabetes and HTN. She also has concerns with Restless legs and chronic pain.  ? ?DM ?Michelle Ray reports: ?- doing well on Ozempic, no side effects to report ?- monitoring diet and working on increasing activity levels ?- high stress job in ICU  ?- Feels like medication is not as effective as it has been- she has been gaining weight in the recent months, which is concerning for her. ?- has heard from others the effects of Mounjaro and is interested in trying this to see if she can get added control ?- BG levels are mostly stable ?- Interested in CGM to help with monitoring BG more closely to help with improved control and dietary choices ?- No hypoglycemic episodes or sx ? ?HTN ?Michelle Ray reports: ?- no CP, ShOB, LE edema, palpitations, HA, vision changes ?- tolerating medication w/o SE ?- BP well controlled when monitoring at home ? ?RLS ?Michelle Ray reports: ?- chronic nightly involuntary movements of LE  ?- has noticed this for quite some time, but recently her daughter brought it to her attention  ?- movements and pain interfere with sleep ?- has not taken anything for this in the past ?- has taken gabapentin in the past but side effects were not tolerable ? ?Pain ?Michelle Ray reports: ?- chronic joint pain in pinpoint locations that affect her QOL  ?- suspects that her symptoms are related to fibromyalgia based on her knowledge ?- interested in changing her celexa to cymbalta to see if this is helpful for control ? ? ?Outpatient Medications Prior to Visit  ?Medication Sig Dispense Refill  ? AMBULATORY NON FORMULARY MEDICATION IBgard ?Take 1-2 capsules by mouth three times daily as needed. 12 capsule 0  ? amLODipine (NORVASC) 10 MG tablet TAKE 1 TABLET BY MOUTH ONCE A DAY 90  tablet 1  ? amoxicillin-clavulanate (AUGMENTIN) 875-125 MG tablet Take 1 tablet by mouth 2 (two) times daily. 14 tablet 0  ? benazepril (LOTENSIN) 20 MG tablet TAKE 1 TABLET BY MOUTH ONCE A DAY 90 tablet 1  ? esomeprazole (NEXIUM) 40 MG capsule TAKE 1 CAPSULE BY MOUTH ONCE A DAY 90 capsule 1  ? ezetimibe (ZETIA) 10 MG tablet Take 1 tablet (10 mg total) by mouth daily. 90 tablet 1  ? furosemide (LASIX) 40 MG tablet TAKE 1 TABLET BY MOUTH ONCE A DAY 90 tablet 1  ? LORazepam (ATIVAN) 1 MG tablet Take 2 tablets 30 minutes prior to leaving house on day of office surgery. 2 tablet 0  ? LORazepam (ATIVAN) 1 MG tablet Take 2 tablets (2 mg total) by mouth 30 minutes prior to leaving house on day of office surgery 2 tablet 0  ? ondansetron (ZOFRAN) 4 MG tablet TAKE 1 TABLET BY MOUTH EVERY 6 TO 8 HOURS AS NEEDED 30 tablet 0  ? potassium chloride SA (KLOR-CON M) 20 MEQ tablet Take 1 tablet by mouth twice a day for 1 week then once a day. Recheck in 4 weeks. 37 tablet 2  ? triamterene-hydrochlorothiazide (MAXZIDE-25) 37.5-25 MG tablet TAKE 1 TABLET BY MOUTH ONCE A DAY 90 tablet 1  ? citalopram (CELEXA) 20 MG tablet Take 1 tablet by mouth at bedtime. 90 tablet 1  ? Semaglutide-Weight Management (WEGOVY) 2.4 MG/0.75ML SOAJ Inject 2.4 mg  into the skin once a week. 3 mL 6  ? ?No facility-administered medications prior to visit.  ? ? ?Allergies  ?Allergen Reactions  ? Crestor [Rosuvastatin]   ?  Joint pain  ? Macrobid [Nitrofurantoin]   ? Pravastatin   ?  Joint pain  ? ? ?ROS ?Review of Systems ?All review of systems negative except what is listed in the HPI ? ?  ?Objective:  ?  ?Physical Exam ?Vitals and nursing note reviewed.  ?Constitutional:   ?   Appearance: Normal appearance.  ?HENT:  ?   Head: Normocephalic.  ?Eyes:  ?   Extraocular Movements: Extraocular movements intact.  ?   Conjunctiva/sclera: Conjunctivae normal.  ?   Pupils: Pupils are equal, round, and reactive to light.  ?Neck:  ?   Vascular: No carotid bruit.   ?Cardiovascular:  ?   Rate and Rhythm: Normal rate and regular rhythm.  ?   Pulses: Normal pulses.  ?   Heart sounds: Normal heart sounds.  ?Pulmonary:  ?   Effort: Pulmonary effort is normal.  ?   Breath sounds: Normal breath sounds.  ?Musculoskeletal:     ?   General: Tenderness present.  ?   Cervical back: Normal range of motion.  ?   Right lower leg: No edema.  ?   Left lower leg: No edema.  ?   Comments: Pinpoint tenderness present in various locations. L knee particularly painful. No edema, warmth, redness, or decreased ROM present.   ?Skin: ?   General: Skin is warm and dry.  ?   Capillary Refill: Capillary refill takes less than 2 seconds.  ?Neurological:  ?   General: No focal deficit present.  ?   Mental Status: She is alert and oriented to person, place, and time.  ?Psychiatric:     ?   Mood and Affect: Mood normal.     ?   Behavior: Behavior normal.     ?   Thought Content: Thought content normal.     ?   Judgment: Judgment normal.  ? ? ?BP 117/82   Pulse 86   Temp 98.7 ?F (37.1 ?C)   Ht '5\' 3"'$  (1.6 m)   Wt 240 lb (108.9 kg)   SpO2 98%   BMI 42.51 kg/m?  ?Wt Readings from Last 3 Encounters:  ?02/27/22 240 lb (108.9 kg)  ?01/28/22 236 lb (107 kg)  ?12/10/21 234 lb (106.1 kg)  ? ? ?  ?Assessment & Plan:  ? ?Problem List Items Addressed This Visit   ? ? Hypertension associated with diabetes ([place])  ?  Chronic. Well controlled on current regimen. ?No concerning findings today- will assess labs today.  ?On statin therapy. Continue current regimen.  ?Notify of any new or concerning symptoms.  ? ?  ?  ? Relevant Medications  ? tirzepatide [facility]) 5 MG/0.5ML Pen  ? tirzepatide (MOUNJARO) 7.5 MG/0.5ML Pen  ? Controlled type 2 diabetes mellitus with complication, without long-term current use of insulin ([place]) - Primary  ?  Chronic. Tolerating Ozempic well, but wishes for improved control and advantages with weight loss.  ?Will transition to mounjaro and include CGM to regimen to help with improved  monitoring.  ?I do feel that the educational benefit of CGM will be helpful to maintain optimal control.  ?HTN and HLD are well managed.  ?She has had an eye exam- we will need to get records of that.  ?Labs today.  ?No alarm sx present.  ?Continue with diet and exercise  and notify immediately if new symptoms present.  ?  ?  ? Relevant Medications  ? tirzepatide [facility]) 5 MG/0.5ML Pen  ? tirzepatide (MOUNJARO) 7.5 MG/0.5ML Pen  ? Other Relevant Orders  ? CBC with Differential/Platelet (Completed)  ? Comprehensive metabolic panel (Completed)  ? Lipid panel (Completed)  ? VITAMIN D 25 Hydroxy (Vit-D Deficiency, Fractures) (Completed)  ? Hemoglobin A1c (Completed)  ? Hyperlipidemia associated with type 2 diabetes mellitus ([place])  ? Relevant Medications  ? tirzepatide [facility]) 5 MG/0.5ML Pen  ? tirzepatide (MOUNJARO) 7.5 MG/0.5ML Pen  ? BMI 40.0-44.9, adult ([place])  ?  BMI fluctuating recently with initial improvement while on Ozempic, but currently experiencing and increase in weight.  ?Patient interested in transitioning to [facility] to see if this would add additional benefit for her weight and glycemic control.  ?Will send mounjaro for patient to try. Will start at low dose and titrate slowly as tolerated. Patient aware of side effects and will notify if these present.  ?Continue with diet and exercise management. Adding CGM to monitor glucose levels continuously to help with weight loss efforts. Goal blood sugar two hours after a meal should be less than 180 for optimal control.  ?  ?  ? Relevant Medications  ? tirzepatide [facility]) 5 MG/0.5ML Pen  ? tirzepatide (MOUNJARO) 7.5 MG/0.5ML Pen  ? Other Relevant Orders  ? CBC with Differential/Platelet (Completed)  ? Comprehensive metabolic panel (Completed)  ? Lipid panel (Completed)  ? VITAMIN D 25 Hydroxy (Vit-D Deficiency, Fractures) (Completed)  ? Hemoglobin A1c (Completed)  ? Aortic atherosclerosis ([place])  ?  Chronic. Well controlled on current regimen.   ?Currently on statin therapy and tolerating well with no concerning SE.  ?BP and DM well controlled at this time.  ?No alarm sx present on exam. Continue current regimen.  ?Labs today.  ?Monitor for new or concerning symptoms and repor

## 2022-02-28 LAB — CBC WITH DIFFERENTIAL/PLATELET
Basophils Absolute: 0.1 10*3/uL (ref 0.0–0.2)
Basos: 1 %
EOS (ABSOLUTE): 0.3 10*3/uL (ref 0.0–0.4)
Eos: 4 %
Hematocrit: 40.6 % (ref 34.0–46.6)
Hemoglobin: 13.3 g/dL (ref 11.1–15.9)
Immature Grans (Abs): 0 10*3/uL (ref 0.0–0.1)
Immature Granulocytes: 0 %
Lymphocytes Absolute: 2.4 10*3/uL (ref 0.7–3.1)
Lymphs: 28 %
MCH: 29.1 pg (ref 26.6–33.0)
MCHC: 32.8 g/dL (ref 31.5–35.7)
MCV: 89 fL (ref 79–97)
Monocytes Absolute: 0.6 10*3/uL (ref 0.1–0.9)
Monocytes: 7 %
Neutrophils Absolute: 5.3 10*3/uL (ref 1.4–7.0)
Neutrophils: 60 %
Platelets: 310 10*3/uL (ref 150–450)
RBC: 4.57 x10E6/uL (ref 3.77–5.28)
RDW: 13.1 % (ref 11.7–15.4)
WBC: 8.7 10*3/uL (ref 3.4–10.8)

## 2022-02-28 LAB — HEMOGLOBIN A1C
Est. average glucose Bld gHb Est-mCnc: 120 mg/dL
Hgb A1c MFr Bld: 5.8 % — ABNORMAL HIGH (ref 4.8–5.6)

## 2022-02-28 LAB — COMPREHENSIVE METABOLIC PANEL
ALT: 18 IU/L (ref 0–32)
AST: 16 IU/L (ref 0–40)
Albumin/Globulin Ratio: 1.4 (ref 1.2–2.2)
Albumin: 4.6 g/dL (ref 3.8–4.9)
Alkaline Phosphatase: 76 IU/L (ref 44–121)
BUN/Creatinine Ratio: 23 (ref 9–23)
BUN: 23 mg/dL (ref 6–24)
Bilirubin Total: 0.3 mg/dL (ref 0.0–1.2)
CO2: 24 mmol/L (ref 20–29)
Calcium: 10 mg/dL (ref 8.7–10.2)
Chloride: 100 mmol/L (ref 96–106)
Creatinine, Ser: 0.98 mg/dL (ref 0.57–1.00)
Globulin, Total: 3.3 g/dL (ref 1.5–4.5)
Glucose: 103 mg/dL — ABNORMAL HIGH (ref 70–99)
Potassium: 4.2 mmol/L (ref 3.5–5.2)
Sodium: 140 mmol/L (ref 134–144)
Total Protein: 7.9 g/dL (ref 6.0–8.5)
eGFR: 67 mL/min/{1.73_m2} (ref >59)

## 2022-02-28 LAB — VITAMIN D 25 HYDROXY (VIT D DEFICIENCY, FRACTURES): Vit D, 25-Hydroxy: 21.4 ng/mL — ABNORMAL LOW (ref 30.0–100.0)

## 2022-02-28 LAB — LIPID PANEL
Chol/HDL Ratio: 4.9 ratio — ABNORMAL HIGH (ref 0.0–4.4)
Cholesterol, Total: 228 mg/dL — ABNORMAL HIGH (ref 100–199)
HDL: 47 mg/dL (ref >39)
LDL Chol Calc (NIH): 139 mg/dL — ABNORMAL HIGH (ref 0–99)
Triglycerides: 232 mg/dL — ABNORMAL HIGH (ref 0–149)
VLDL Cholesterol Cal: 42 mg/dL — ABNORMAL HIGH (ref 5–40)

## 2022-03-02 ENCOUNTER — Encounter (HOSPITAL_BASED_OUTPATIENT_CLINIC_OR_DEPARTMENT_OTHER): Payer: Self-pay | Admitting: Nurse Practitioner

## 2022-03-02 ENCOUNTER — Telehealth: Payer: Self-pay

## 2022-03-02 NOTE — Telephone Encounter (Signed)
-----   Message from Kate Sable, MD sent at 03/02/2022  8:09 AM EDT ----- ?Cholesterol not controlled.  Increase Crestor to 40 mg daily.  Repeat lipid panel in 2 months. ?

## 2022-03-02 NOTE — Telephone Encounter (Signed)
Left a VM for patient requesting a call back and also sent a MyChart message with the following: ? ?Dr. Launa Grill reviewed your Lipid panel and sent the following result note: ? ?"Cholesterol not controlled.  Increase Rosuvastatin (Crestor) to 40 mg daily.  Repeat lipid panel in 2 months." ? ?Looking at your medication list, it looks like you are not taking the Rosuvastatin? Please either call or MyChart me back to let me know what you are taking.  ? ?Thank you! ? ?Legend Tumminello RN ?

## 2022-03-04 ENCOUNTER — Other Ambulatory Visit (HOSPITAL_BASED_OUTPATIENT_CLINIC_OR_DEPARTMENT_OTHER): Payer: Self-pay | Admitting: Nurse Practitioner

## 2022-03-04 ENCOUNTER — Other Ambulatory Visit (HOSPITAL_COMMUNITY): Payer: Self-pay

## 2022-03-04 ENCOUNTER — Encounter: Payer: Self-pay | Admitting: Cardiology

## 2022-03-04 DIAGNOSIS — E559 Vitamin D deficiency, unspecified: Secondary | ICD-10-CM

## 2022-03-04 DIAGNOSIS — E782 Mixed hyperlipidemia: Secondary | ICD-10-CM

## 2022-03-04 DIAGNOSIS — I10 Essential (primary) hypertension: Secondary | ICD-10-CM

## 2022-03-04 DIAGNOSIS — E118 Type 2 diabetes mellitus with unspecified complications: Secondary | ICD-10-CM

## 2022-03-04 DIAGNOSIS — I700 Atherosclerosis of aorta: Secondary | ICD-10-CM

## 2022-03-04 MED ORDER — VITAMIN D (ERGOCALCIFEROL) 1.25 MG (50000 UNIT) PO CAPS
50000.0000 [IU] | ORAL_CAPSULE | ORAL | 0 refills | Status: DC
Start: 2022-03-04 — End: 2022-06-22
  Filled 2022-03-04: qty 12, 84d supply, fill #0

## 2022-03-04 MED ORDER — FREESTYLE LIBRE 2 SENSOR MISC
12 refills | Status: DC
Start: 2022-03-04 — End: 2022-08-28
  Filled 2022-03-04: qty 2, 28d supply, fill #0
  Filled 2022-04-07: qty 2, 28d supply, fill #1

## 2022-03-04 NOTE — Telephone Encounter (Signed)
Patient responded through [place] and stated she is not taking Crestor any longer. Found the following documentation in her chart from 03/10/2021: ? ?Kate Sable, MD  You 1 hour ago (10:53 AM)  ?  ?Please have patient stop Crestor.  Please myalgias under allergies to statins (lipitor, crestor) in patient's chart.  ? ?Start Zetia 10 mg daily.  ? ?  ?  ? ?Will route to Dr. Garen Lah for new medication recommendations. ?

## 2022-03-05 ENCOUNTER — Other Ambulatory Visit (HOSPITAL_COMMUNITY): Payer: Self-pay

## 2022-03-06 ENCOUNTER — Other Ambulatory Visit (HOSPITAL_COMMUNITY): Payer: Self-pay

## 2022-03-06 DIAGNOSIS — G2581 Restless legs syndrome: Secondary | ICD-10-CM | POA: Insufficient documentation

## 2022-03-06 DIAGNOSIS — M797 Fibromyalgia: Secondary | ICD-10-CM | POA: Insufficient documentation

## 2022-03-06 NOTE — Assessment & Plan Note (Signed)
Chronic. Well controlled on current regimen.  ?Currently on statin therapy and tolerating well with no concerning SE.  ?BP and DM well controlled at this time.  ?No alarm sx present on exam. Continue current regimen.  ?Labs today.  ?Monitor for new or concerning symptoms and report immediately.  ?

## 2022-03-06 NOTE — Assessment & Plan Note (Signed)
Pinpoint chronic muscle pain scattered with no known etiology. Symptoms and presentation consistent with fibromyalgia pain syndrome. At this time symptoms are affecting QOL and ability to exercise.  ?Unable to tolerate gabapentin due to side effects.  ?Will change celexa to cybalta with plan to titrate dosing rapidly. Will likely need to increase dose to higher levels for optimal control. Will also add flexeril as needed to help with pain and muscle tension present. She has tolerated this medication in the past. Recommend use flexeril in evenings or days off as it can cause fatigue.  ?Follow-up if symptoms are not well controlled or medication side effects present.  ?

## 2022-03-06 NOTE — Assessment & Plan Note (Signed)
Nocturnal uncontrolled leg movements affecting sleep. Chronic in nature ?Will monitor labs today for electrolyte imbalances that could be contributing. ?Unable to tolerate gabapentin due to SE.  ?Discussed alternative options that [place] be helpful with patient today.  ?Will trial Sinemet to see if this is helpful for symptoms.  ?Recommend follow-up if adverse effects are noted or symptoms are not improved.  ?

## 2022-03-06 NOTE — Assessment & Plan Note (Signed)
Chronic. Well controlled on current regimen. ?No concerning findings today- will assess labs today.  ?On statin therapy. Continue current regimen.  ?Notify of any new or concerning symptoms.  ? ?

## 2022-03-06 NOTE — Assessment & Plan Note (Addendum)
Chronic. Tolerating Ozempic well, but wishes for improved control and advantages with weight loss.  ?Will transition to mounjaro and include CGM to regimen to help with improved monitoring.  ?I do feel that the educational benefit of CGM will be helpful to maintain optimal control.  ?HTN and HLD are well managed.  ?She has had an eye exam- we will need to get records of that.  ?Labs today.  ?No alarm sx present.  ?Continue with diet and exercise and notify immediately if new symptoms present.  ?

## 2022-03-06 NOTE — Assessment & Plan Note (Signed)
BMI fluctuating recently with initial improvement while on Ozempic, but currently experiencing and increase in weight.  ?Patient interested in transitioning to [facility] to see if this would add additional benefit for her weight and glycemic control.  ?Will send mounjaro for patient to try. Will start at low dose and titrate slowly as tolerated. Patient aware of side effects and will notify if these present.  ?Continue with diet and exercise management. Adding CGM to monitor glucose levels continuously to help with weight loss efforts. Goal blood sugar two hours after a meal should be less than 180 for optimal control.  ?

## 2022-03-09 ENCOUNTER — Encounter: Payer: Self-pay | Admitting: Cardiology

## 2022-03-09 ENCOUNTER — Other Ambulatory Visit (HOSPITAL_BASED_OUTPATIENT_CLINIC_OR_DEPARTMENT_OTHER): Payer: Self-pay | Admitting: Nurse Practitioner

## 2022-03-09 DIAGNOSIS — E876 Hypokalemia: Secondary | ICD-10-CM

## 2022-03-10 ENCOUNTER — Other Ambulatory Visit (HOSPITAL_COMMUNITY): Payer: Self-pay

## 2022-03-10 ENCOUNTER — Other Ambulatory Visit: Payer: Self-pay | Admitting: *Deleted

## 2022-03-10 MED ORDER — BEMPEDOIC ACID 180 MG PO TABS
180.0000 mg | ORAL_TABLET | Freq: Every day | ORAL | 3 refills | Status: DC
  Filled 2022-03-10 – 2022-03-20 (×2): qty 90, 90d supply, fill #0
  Filled 2022-06-25: qty 90, 90d supply, fill #1
  Filled 2022-10-01: qty 90, 90d supply, fill #2
  Filled 2023-01-27: qty 90, 90d supply, fill #3

## 2022-03-10 MED ORDER — EZETIMIBE 10 MG PO TABS
10.0000 mg | ORAL_TABLET | Freq: Every day | ORAL | 0 refills | Status: DC
  Filled 2022-03-10: qty 90, 90d supply, fill #0

## 2022-03-10 NOTE — Telephone Encounter (Signed)
Left voicemail message to call back for review of recommendations.  

## 2022-03-11 ENCOUNTER — Telehealth: Payer: Self-pay | Admitting: *Deleted

## 2022-03-11 NOTE — Telephone Encounter (Signed)
PA required for Nexletol 180 mg tablet. ?PA has been submitted via covermymeds. ?Awaiting approval. ? ?Your information has been sent to [place]. ?

## 2022-03-12 ENCOUNTER — Other Ambulatory Visit (HOSPITAL_COMMUNITY): Payer: Self-pay

## 2022-03-12 NOTE — Telephone Encounter (Signed)
LMOV  

## 2022-03-13 NOTE — Telephone Encounter (Signed)
Per fax received from [employer], Nexletol 180 mg tablets have been approved from 03/13/2022 to 03/13/2023. ?

## 2022-03-13 NOTE — Telephone Encounter (Signed)
Called patient and left a VM stating that her Nexletol was approved and should be ready to pick up. Encouraged her to call back if she had any further questions. ?

## 2022-03-19 ENCOUNTER — Other Ambulatory Visit (HOSPITAL_COMMUNITY): Payer: Self-pay

## 2022-03-20 ENCOUNTER — Other Ambulatory Visit (HOSPITAL_COMMUNITY): Payer: Self-pay

## 2022-03-23 ENCOUNTER — Other Ambulatory Visit (HOSPITAL_BASED_OUTPATIENT_CLINIC_OR_DEPARTMENT_OTHER): Payer: Self-pay | Admitting: Nurse Practitioner

## 2022-03-23 ENCOUNTER — Other Ambulatory Visit (HOSPITAL_COMMUNITY): Payer: Self-pay

## 2022-03-23 MED ORDER — BENAZEPRIL HCL 20 MG PO TABS
ORAL_TABLET | Freq: Every day | ORAL | 1 refills | Status: DC
  Filled 2022-03-23: qty 90, 90d supply, fill #0
  Filled 2022-06-25: qty 90, 90d supply, fill #1

## 2022-03-25 ENCOUNTER — Other Ambulatory Visit (HOSPITAL_BASED_OUTPATIENT_CLINIC_OR_DEPARTMENT_OTHER): Payer: Self-pay | Admitting: Nurse Practitioner

## 2022-03-25 DIAGNOSIS — M25572 Pain in left ankle and joints of left foot: Secondary | ICD-10-CM

## 2022-03-27 ENCOUNTER — Ambulatory Visit (INDEPENDENT_AMBULATORY_CARE_PROVIDER_SITE_OTHER): Payer: 59

## 2022-03-27 ENCOUNTER — Other Ambulatory Visit (HOSPITAL_COMMUNITY): Payer: Self-pay

## 2022-03-27 ENCOUNTER — Ambulatory Visit (INDEPENDENT_AMBULATORY_CARE_PROVIDER_SITE_OTHER): Payer: 59 | Admitting: Podiatry

## 2022-03-27 DIAGNOSIS — M722 Plantar fascial fibromatosis: Secondary | ICD-10-CM

## 2022-03-27 DIAGNOSIS — M7731 Calcaneal spur, right foot: Secondary | ICD-10-CM

## 2022-03-27 DIAGNOSIS — M778 Other enthesopathies, not elsewhere classified: Secondary | ICD-10-CM

## 2022-03-27 MED ORDER — METHYLPREDNISOLONE 4 MG PO TBPK
ORAL_TABLET | ORAL | 0 refills | Status: DC
  Filled 2022-03-27: qty 21, 6d supply, fill #0

## 2022-03-27 MED ORDER — MELOXICAM 15 MG PO TABS
15.0000 mg | ORAL_TABLET | Freq: Every day | ORAL | 1 refills | Status: DC
  Filled 2022-03-27: qty 30, 30d supply, fill #0

## 2022-03-27 NOTE — Progress Notes (Signed)
? ?  Subjective: ?56 y.o. female presenting today as a new patient for evaluation of bilateral foot pain.  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  She says that she experiences chronic bilateral foot pain.  She takes OTC Naprosyn.  She presents for further treatment and evaluation ? ? ?Past Medical History:  ?Diagnosis Date  ? Allergy   ? Anemia   ? Aortic atherosclerosis ([place])   ? 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 Constipation   ? uses OTC stool softener prn only- this is not chronic  ? Diabetes mellitus without complication ([place])   ? GERD (gastroesophageal reflux disease)   ? Hyperlipemia   ? Hyperlipidemia   ? Hypertension   ? ?Past Surgical History:  ?Procedure Laterality Date  ? ABLATION    ? ENDOVENOUS ABLATION SAPHENOUS VEIN W/ LASER Right 01/28/2022  ? endovenous laser ablation right greater saphenous vein and stab phlebectomy 10-20 incisions right leg by Gae Gallop MD  ? TUBAL LIGATION    ? uterine fibroid removal    ? WISDOM TOOTH EXTRACTION    ? ?Allergies  ?Allergen Reactions  ? Crestor [Rosuvastatin]   ?  Joint pain  ? Macrobid [Nitrofurantoin]   ? Pravastatin   ?  Joint pain  ? ? ? ?Objective: ?Physical Exam ?General: The patient is alert and oriented x3 in no acute distress. ? ?Dermatology: Skin is warm, dry and supple bilateral lower extremities. Negative for open lesions or macerations bilateral.  ? ?Vascular: Dorsalis Pedis and Posterior Tibial pulses palpable bilateral.  Capillary fill time is immediate to all digits. ? ?Neurological: Epicritic and protective threshold intact bilateral.  ? ?Musculoskeletal: Tenderness to palpation to the plantar aspect of the right heel along the plantar fascia. All other joints range of motion within normal limits bilateral.  There is also diffuse pain throughout the TMT left foot.  Strength 5/5 in all groups bilateral.  ? ?Radiographic exam: ?Normal osseous  mineralization. Joint spaces preserved. No fracture/dislocation/boney destruction. No other soft tissue abnormalities or radiopaque foreign bodies.  Plantar heel spurs noted bilateral with small posterior heel spurs as well ? ?Assessment: ?1. Plantar fasciitis right ?2.  TMT capsulitis left ? ?Plan of Care:  ?1. Patient evaluated. Xrays reviewed.   ?2.  Patient declined injections today ?3. Rx for Medrol Dose Pack placed ?4. Rx for Meloxicam ordered for patient. ?5.  Appointment with Pedorthist for custom molded orthotics  ?6. Instructed patient regarding therapies and modalities at home to alleviate symptoms.  ?7.  Return to clinic as needed ? ? ?Edrick Kins, DPM ?[place] ? ?Dr. Edrick Kins, DPM  ?  ?[address]                                        ?[place], [place] [address]                ?Office [phone]  ?Fax [phone] ? ? ? ? ?

## 2022-03-28 ENCOUNTER — Other Ambulatory Visit (HOSPITAL_COMMUNITY): Payer: Self-pay

## 2022-03-31 ENCOUNTER — Ambulatory Visit (INDEPENDENT_AMBULATORY_CARE_PROVIDER_SITE_OTHER): Payer: 59

## 2022-03-31 DIAGNOSIS — M722 Plantar fascial fibromatosis: Secondary | ICD-10-CM

## 2022-03-31 DIAGNOSIS — M778 Other enthesopathies, not elsewhere classified: Secondary | ICD-10-CM

## 2022-03-31 NOTE — Progress Notes (Signed)
SITUATION ?Reason for Consult: Evaluation for Bilateral Custom Foot Orthoses ?Patient / Caregiver Report: Patient is ready for foot orthotics ? ?OBJECTIVE DATA: ?Patient History / Diagnosis:  ?  ICD-10-CM   ?1. Plantar fasciitis, right  M72.2   ?  ?2. Capsulitis of left foot  M77.8   ?  ? ? ?Current or Previous Devices:   None and no history ? ?Foot Examination: ?Skin presentation:   Intact ?Ulcers & Callousing:   None ?Toe / Foot Deformities:  None ?Weight Bearing Presentation:  Rectus ?Sensation:    Intact ? ?Shoe Size:    7.5W ? ?ORTHOTIC RECOMMENDATION ?Recommended Device: 1x pair of custom functional foot orthotics ? ?GOALS OF ORTHOSES ?- Reduce Pain ?- Prevent Foot Deformity ?- Prevent Progression of Further Foot Deformity ?- Relieve Pressure ?- Improve the Overall Biomechanical Function of the Foot and Lower Extremity. ? ?ACTIONS PERFORMED ?Potential out of pocket cost was communicated to patient. Patient understood and consent to casting. Patient was casted for Foot Orthoses via crush box. Procedure was explained and patient tolerated procedure well. Casts were shipped to central fabrication. All questions were answered and concerns addressed. ? ?PLAN ?Patient is to be called for fitting when devices are ready.  ? ? ?

## 2022-04-07 ENCOUNTER — Other Ambulatory Visit (HOSPITAL_COMMUNITY): Payer: Self-pay

## 2022-04-13 ENCOUNTER — Ambulatory Visit (HOSPITAL_BASED_OUTPATIENT_CLINIC_OR_DEPARTMENT_OTHER): Payer: 59 | Admitting: Orthopaedic Surgery

## 2022-04-22 ENCOUNTER — Ambulatory Visit (HOSPITAL_BASED_OUTPATIENT_CLINIC_OR_DEPARTMENT_OTHER): Payer: 59 | Admitting: Orthopaedic Surgery

## 2022-04-22 ENCOUNTER — Ambulatory Visit (INDEPENDENT_AMBULATORY_CARE_PROVIDER_SITE_OTHER): Payer: 59

## 2022-04-22 DIAGNOSIS — M25562 Pain in left knee: Secondary | ICD-10-CM | POA: Diagnosis not present

## 2022-04-22 DIAGNOSIS — G8929 Other chronic pain: Secondary | ICD-10-CM

## 2022-04-22 DIAGNOSIS — M1712 Unilateral primary osteoarthritis, left knee: Secondary | ICD-10-CM

## 2022-04-22 NOTE — Progress Notes (Signed)
Chief Complaint: Left knee pain     History of Present Illness:    Michelle Ray is a 56 y.o. female presents today with left knee pain which has been ongoing for 1-1/2 years.  She experiences occasional crepitus and popping in the medial aspect of the knee.  She does take Tylenol and ibuprofen as needed.  She does feel that the knee will buckle occasionally give out.  She experiences the pain predominantly about the medial foot as well as the posterior aspect of the knee.  She works as an Warden/ranger at [employer].  No history of prior surgery.  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)    Constipation    uses OTC stool softener prn only- this is not chronic   Diabetes mellitus without complication (HCC)    GERD (gastroesophageal reflux disease)    Hyperlipemia    Hyperlipidemia    Hypertension    Past Surgical History:  Procedure Laterality Date   ABLATION     ENDOVENOUS ABLATION SAPHENOUS VEIN W/ LASER Right 01/28/2022   endovenous laser ablation right greater saphenous vein and stab phlebectomy 10-20 incisions right leg by Gae Gallop MD   TUBAL LIGATION     uterine fibroid removal     WISDOM TOOTH EXTRACTION     Social History   Socioeconomic History   Marital status: Married    Spouse name: Randall Hiss   Number of children: Not on file   Years of education: Not on file   Highest education level: Master's degree (e.g., MA, MS, MEng, MEd, MSW, MBA)  Occupational History   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 Married to [place]. Adult children.    Social Determinants of Health   Financial Resource Strain: Not on file  Food Insecurity: Not on file  Transportation Needs: Not on file  Physical Activity: Not on file  Stress: Not on file  Social Connections: Not on file   Family History  Problem Relation Age of Onset   Atrial fibrillation Mother    Pulmonary embolism Mother    Breast cancer Mother 1   Heart failure Sister    Hypertension Maternal Grandmother    Heart disease Maternal Grandfather    Stroke Paternal Grandfather    Colon cancer Neg Hx    Colon polyps Neg Hx    Allergies  Allergen Reactions   Crestor [Rosuvastatin]     Joint pain   Macrobid [Nitrofurantoin]    Pravastatin     Joint pain   Current Outpatient Medications  Medication Sig Dispense Refill  AMBULATORY NON FORMULARY MEDICATION IBgard Take 1-2 capsules by mouth three times daily as needed. 12 capsule 0   amLODipine (NORVASC) 10 MG tablet TAKE 1 TABLET BY MOUTH ONCE A DAY 90 tablet 1   amoxicillin-clavulanate (AUGMENTIN) 875-125 MG tablet Take 1 tablet by mouth 2 (two) times daily. 14 tablet 0   Bempedoic Acid 180 MG TABS Take 1 tablet (180 mg) by mouth daily. 90 tablet 3   benazepril (LOTENSIN) 20 MG tablet TAKE 1 TABLET BY MOUTH ONCE A DAY 90 tablet 1   carbidopa-levodopa (SINEMET) 25-250 MG tablet Take 1 tablet by mouth at bedtime. 30 tablet 3   Continuous Blood Gluc Sensor (FREESTYLE LIBRE 2 SENSOR) MISC Attach one sensor to the arm every 14 days. 2 each 12   cyclobenzaprine (FLEXERIL) 10 MG tablet Take 1/2-1 tablet (5-'10mg'$ ) up to three times a day for fibromyalgia pain. 90 tablet 6   DULoxetine (CYMBALTA) 20 MG capsule Take 1 tab ('20mg'$ ) by mouth for the first 7 days then increase to 2 tablets ('40mg'$ ) by  mouth daily. 60 capsule 3   esomeprazole (NEXIUM) 40 MG capsule TAKE 1 CAPSULE BY MOUTH ONCE A DAY 90 capsule 1   ezetimibe (ZETIA) 10 MG tablet Take 1 tablet (10 mg total) by mouth daily. 90 tablet 0   furosemide (LASIX) 40 MG tablet TAKE 1 TABLET BY MOUTH ONCE A DAY 90 tablet 1   LORazepam (ATIVAN) 1 MG tablet Take 2 tablets 30 minutes prior to leaving house on day of office surgery. 2 tablet 0   LORazepam (ATIVAN) 1 MG tablet Take 2 tablets (2 mg total) by mouth 30 minutes prior to leaving house on day of office surgery 2 tablet 0   meloxicam (MOBIC) 15 MG tablet Take 1 tablet by mouth daily. 30 tablet 1   methylPREDNISolone (MEDROL DOSEPAK) 4 MG TBPK tablet Take as directed 21 tablet 0   ondansetron (ZOFRAN) 4 MG tablet TAKE 1 TABLET BY MOUTH EVERY 6 TO 8 HOURS AS NEEDED 30 tablet 0   potassium chloride SA (KLOR-CON M) 20 MEQ tablet Take 1 tablet by mouth twice a day for 1 week then once a day. Recheck in 4 weeks. 37 tablet 2   tirzepatide (MOUNJARO) 5 MG/0.5ML Pen Inject 5 mg into the skin once a week. 2 mL 0   tirzepatide (MOUNJARO) 7.5 MG/0.5ML Pen Inject 7.5 mg into the skin once a week. 6 mL 1   triamterene-hydrochlorothiazide (MAXZIDE-25) 37.5-25 MG tablet TAKE 1 TABLET BY MOUTH ONCE A DAY 90 tablet 1   Vitamin D, Ergocalciferol, (DRISDOL) 1.25 MG (50000 UNIT) CAPS capsule Take 1 capsule (50,000 Units total) by mouth every 7 (seven) days. Take for 12 total weeks then can transition to 1000 units OTC supplement daily 12 capsule 0   No current facility-administered medications for this visit.   No results found.  Review of Systems:   A ROS was performed including pertinent positives and negatives as documented in the HPI.  Physical Exam :   Constitutional: NAD and appears stated age Neurological: Alert and oriented Psych: Appropriate affect and cooperative There were no vitals taken for this visit.   Comprehensive Musculoskeletal Exam:      Musculoskeletal Exam  Gait Normal   Alignment Normal   Right Left  Inspection Normal Normal  Palpation    Tenderness Medial joint patellofemoral None  Crepitus Positive None  Effusion Trace None  Range of Motion    Extension 0 0  Flexion 135 135  Strength  Extension 5/5 5/5  Flexion 5/5 5/5  Ligament Exam     Generalized Laxity No No  Lachman Negative Negative   Pivot Shift Negative Negative  Anterior Drawer Negative Negative  Valgus at 0 Negative Negative  Valgus at 20 Negative Negative  Varus at 0 0 0  Varus at 20   0 0  Posterior Drawer at 90 0 0  Vascular/Lymphatic Exam    Edema None None  Venous Stasis Changes No No  Distal Circulation Normal Normal  Neurologic    Light Touch Sensation Intact Intact  Special Tests:     Imaging:   Xray (4 views left knee): There is mild medial joint space narrowing consistent with early osteoarthritis   I personally reviewed and interpreted the radiographs.   Assessment:   56 y.o. female with left mild osteoarthritis.  Overall I described that given the fact that she does still have preserved joint spaces I do believe that she do quite well with conservative management at this time.  To that effect I recommended ultrasound-guided injection of the knee.  Also like to plan to send her for physical therapy for a home strengthening program that she can work on on her days off.  I will see her back on an as-needed basis should she want an additional injection.  Plan :    -She will follow-up as needed should she want additional injection     I personally saw and evaluated the patient, and participated in the management and treatment plan.  Vanetta Mulders, MD Attending Physician, Orthopedic Surgery  This document was dictated using Dragon voice recognition software. A reasonable attempt at proof reading has been made to minimize errors.

## 2022-04-23 ENCOUNTER — Ambulatory Visit: Payer: 59

## 2022-04-23 DIAGNOSIS — M778 Other enthesopathies, not elsewhere classified: Secondary | ICD-10-CM

## 2022-04-23 DIAGNOSIS — M722 Plantar fascial fibromatosis: Secondary | ICD-10-CM

## 2022-04-23 NOTE — Progress Notes (Signed)
SITUATION: Reason for Visit: Fitting and Delivery of Custom Fabricated Foot Orthoses Patient Report: Patient reports comfort and is satisfied with device.  OBJECTIVE DATA: Patient History / Diagnosis:     ICD-10-CM   1. Plantar fasciitis, right  M72.2     2. Capsulitis of left foot  M77.8       Provided Device:  Custom Functional Foot Orthotics     [facility]: [id number]  GOAL OF ORTHOSIS - Improve gait - Decrease energy expenditure - Improve Balance - Provide Triplanar stability of foot complex - Facilitate motion  ACTIONS PERFORMED Patient was fit with foot orthotics trimmed to shoe last. Patient tolerated fittign procedure.   Patient was provided with verbal and written instruction and demonstration regarding donning, doffing, wear, care, proper fit, function, purpose, cleaning, and use of the orthosis and in all related precautions and risks and benefits regarding the orthosis.  Patient was also provided with verbal instruction regarding how to report any failures or malfunctions of the orthosis and necessary follow up care. Patient was also instructed to contact our office regarding any change in status that He affect the function of the orthosis.  Patient demonstrated independence with proper donning, doffing, and fit and verbalized understanding of all instructions.  PLAN: Patient is to follow up in one week or as necessary (PRN). All questions were answered and concerns addressed. Plan of care was discussed with and agreed upon by the patient.

## 2022-04-24 ENCOUNTER — Other Ambulatory Visit: Payer: 59

## 2022-04-30 ENCOUNTER — Other Ambulatory Visit (HOSPITAL_BASED_OUTPATIENT_CLINIC_OR_DEPARTMENT_OTHER): Payer: Self-pay | Admitting: Nurse Practitioner

## 2022-05-01 ENCOUNTER — Other Ambulatory Visit (HOSPITAL_COMMUNITY): Payer: Self-pay

## 2022-05-05 ENCOUNTER — Other Ambulatory Visit (HOSPITAL_COMMUNITY): Payer: Self-pay

## 2022-05-05 MED ORDER — FUROSEMIDE 40 MG PO TABS
ORAL_TABLET | Freq: Every day | ORAL | 1 refills | Status: DC
  Filled 2022-05-05: qty 90, 90d supply, fill #0

## 2022-05-05 MED ORDER — TRIAMTERENE-HCTZ 37.5-25 MG PO TABS
1.0000 | ORAL_TABLET | Freq: Every day | ORAL | 1 refills | Status: DC
  Filled 2022-05-05: qty 90, 90d supply, fill #0

## 2022-05-16 ENCOUNTER — Other Ambulatory Visit (HOSPITAL_COMMUNITY): Payer: Self-pay

## 2022-05-26 ENCOUNTER — Other Ambulatory Visit (HOSPITAL_COMMUNITY): Payer: Self-pay

## 2022-05-27 ENCOUNTER — Ambulatory Visit (HOSPITAL_BASED_OUTPATIENT_CLINIC_OR_DEPARTMENT_OTHER): Payer: 59 | Attending: Orthopaedic Surgery | Admitting: Physical Therapy

## 2022-05-27 ENCOUNTER — Encounter (HOSPITAL_BASED_OUTPATIENT_CLINIC_OR_DEPARTMENT_OTHER): Payer: Self-pay | Admitting: Physical Therapy

## 2022-05-27 DIAGNOSIS — M6281 Muscle weakness (generalized): Secondary | ICD-10-CM | POA: Diagnosis not present

## 2022-05-27 DIAGNOSIS — M1712 Unilateral primary osteoarthritis, left knee: Secondary | ICD-10-CM | POA: Insufficient documentation

## 2022-05-27 DIAGNOSIS — R262 Difficulty in walking, not elsewhere classified: Secondary | ICD-10-CM | POA: Insufficient documentation

## 2022-05-27 DIAGNOSIS — M25562 Pain in left knee: Secondary | ICD-10-CM | POA: Diagnosis not present

## 2022-05-27 NOTE — Therapy (Incomplete)
OUTPATIENT PHYSICAL THERAPY LOWER EXTREMITY EVALUATION   Patient Name: Michelle Ray MRN: [id number] DOB:[birth year], 56 y.o., female Today's Date: 05/28/2022   PT End of Session - 05/28/22 2305     Visit Number 1    Number of Visits 12    Date for PT Re-Evaluation 07/08/22    Authorization Type [facility] [employer]    PT Start Time 1612    PT Stop Time 5176    PT Time Calculation (min) 45 min    Activity Tolerance Patient tolerated treatment well    Behavior During Therapy WFL for tasks assessed/performed             Past Medical History:  Diagnosis Date   Allergy    Anemia    Aortic atherosclerosis ([place])    Benign secondary hypertension due to renal artery stenosis (HCC)    Constipation    uses OTC stool softener prn only- this is not chronic   Diabetes mellitus without complication (HCC)    GERD (gastroesophageal reflux disease)    Hyperlipemia    Hyperlipidemia    Hypertension    Past Surgical History:  Procedure Laterality Date   ABLATION     ENDOVENOUS ABLATION SAPHENOUS VEIN W/ LASER Right 01/28/2022   endovenous laser ablation right greater saphenous vein and stab phlebectomy 10-20 incisions right leg by Gae Gallop MD   TUBAL LIGATION     uterine fibroid removal     WISDOM TOOTH EXTRACTION     Patient Active Problem List   Diagnosis Date Noted   RLS (restless legs syndrome) 03/06/2022   Fibromyalgia muscle pain 03/06/2022   Small right kidney 08/29/2021   Atrophy of right kidney 08/29/2021   Varicose veins of both legs with edema 02/17/2021   Stress incontinence in female 02/17/2021   Pain and swelling of right ankle 02/17/2021   Controlled type 2 diabetes mellitus with complication, without long-term current use of insulin ([place]) 02/17/2021   Hyperlipidemia associated with type 2 diabetes mellitus ([place]) 02/17/2021   Right upper quadrant abdominal pain 02/17/2021   BMI 40.0-44.9, adult ([place]) 02/17/2021   Aortic atherosclerosis ([place]) 02/17/2021   Chronic  thumb pain, bilateral 02/17/2021   Hypertension associated with diabetes ([place]) 05/29/2016    PCP: Jacolyn Reedy, NP  REFERRING PROVIDER: Vanetta Mulders, MD    REFERRING DIAG: M17.12 (ICD-10-CM) - Unilateral primary osteoarthritis, left knee   THERAPY DIAG:  Left knee pain, unspecified chronicity  Muscle weakness (generalized)  Difficulty in walking, not elsewhere classified  Rationale for Evaluation and Treatment Rehabilitation  ONSET DATE: Exacerbation January 2023  SUBJECTIVE:   SUBJECTIVE STATEMENT: Pt states she noticed pain in L knee about 1.5 years ago without any specific injury.  Pt reports pain getting worse approx 5-6 months ago and having crepitus when performing stairs.  Pt has not received treatment for knee until seeing Dr. Sammuel Hines on 04/22/2022.  Pt had x rays and MD note indicated mild OA.  MD ordered PT and order indicated L knee quad and hip strengthening.    Pt c/o's of her knee wanting to give way and buckles occasionally.  Her knee has buckled approx 3 times in the past couple of weeks, though she has not fallen. Pt has pain with functional mobility skills including ambulation, transfers, and stairs.  Pt is limited with ambulation distance.  She has more pain and difficulty with ascending than descending stairs.  Pt has pain with work activities.   Pt feels better after riding recumbent bike for 5  mins at work.  Easing Factors:  Tylenol, Ibuprofen, ice and elevation, rest  PERTINENT HISTORY: DM, bilat foot pain (has bone spurs per pt)  PAIN:  Are you having pain? Yes NPRS:  Current:  3-4/10, Best:  1/10, Worst:  7-8/10 Type:  dull ache, can be sharp.  Constant pain Location:  L knee, more medial  PRECAUTIONS: Other: per dx  WEIGHT BEARING RESTRICTIONS No  FALLS:  Has patient fallen in last 6 months? 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  Pt is on her feet most of the day 2/3 days per week.    PLOF: Independent; pt was able to perform all of her daily activities, work activities, and functional mobility with less pain.    PATIENT GOALS reduce pain, improved walking with less pain   OBJECTIVE:   DIAGNOSTIC FINDINGS:  Xray (per MD note): There is mild medial joint space narrowing consistent with early osteoarthritis X ray (per Epic): IMPRESSION: 1. No acute fracture or dislocation. 2. Small joint effusion. 3. Mild degenerative changes of the medial compartment.  PATIENT SURVEYS:  FOTO 56 with a goal of 68 at visit #11  COGNITION:  Overall cognitive status: Within functional limits for tasks assessed      PALPATION: TTP:  medial, post, and ant L knee.  L quad  LOWER EXTREMITY ROM:  Active ROM LEFT eval RIGHT eval  Hip flexion    Hip extension [facility]  Hip abduction [facility]  Hip adduction    Hip internal rotation    Hip external rotation    Knee flexion 116 "uncomfortable" 115  Knee extension 0 "feels uncomfortable" 0  Ankle dorsiflexion    Ankle plantarflexion    Ankle inversion    Ankle eversion     (Blank rows = not tested)  LOWER EXTREMITY MMT:  MMT Right eval Left eval  Hip flexion 5/5 4/5  Hip extension 4/5 4/5  Hip abduction 4+/5 3+/5  Hip adduction    Hip internal rotation    Hip external rotation 5/5 4+/5  Knee flexion 4+/5 Unable to tolerate resistance  Knee extension 5/5 4-/5  Ankle dorsiflexion    Ankle plantarflexion    Ankle inversion    Ankle eversion     (Blank rows = not tested)   GAIT: Assistive device utilized: None Level of assistance: Independent Comments: antalgic gait, decreased gait speed, decreased stance time/Wb'ing thru L LE, limited TKE    TODAY'S TREATMENT: Pt performed supine SLR, supine clams, supine bridge, quad sets with 5sec hold, and supine heel slide all x10 reps.   Pt received a HEP handout and was educated in correct form and appropriate frequency.  Pt received a HEP handout and was educated in correct form and appropriate frequency.  Pt instructed she should not have increased pain with HEP.      PATIENT EDUCATION:  Education details: PT instructed pt in continuing with bike at work.  HEP, POC, dx, relevant anatomy, objective findings, and rationale of exercises.  Person educated: Patient Education method: Explanation, Demonstration, Tactile cues, Verbal cues, and Handouts Education comprehension: verbalized understanding, returned demonstration, verbal cues required, tactile cues required, and needs further education   HOME EXERCISE PROGRAM: Access Code: [email or web address] URL: [email or web address] Date: 05/27/2022 Prepared by: Ronny Flurry  Exercises - Supine Quadricep  Sets  - 2 x daily - 7 x weekly - 2 sets - 10 reps - 5 seconds hold - Supine Active Straight Leg Raise  - 1 x daily - 5-6 x weekly - 2 sets - 10 reps - Supine Bridge  - 1 x daily - 5-6 x weekly - 2 sets - 10 reps - Hooklying Clamshell with Resistance  - 1 x daily - 4-5 x weekly - 2 sets - 10 reps - Supine Heel Slide  - 2 x daily - 7 x weekly - 2 sets - 10 reps  ASSESSMENT:  CLINICAL IMPRESSION: Patient is a 56 y.o. female with a dx of L knee OA presenting to the clinic with L knee pain, muscle weakness in L > R LE, and difficulty in walking.  Pt c/o's of her knee wanting to give way and buckles occasionally.  Pt has pain with functional mobility skills including ambulation, transfers, and stairs.  Pt is limited with ambulation distance and has more pain and difficulty with ascending than descending stairs.  She also has pain with work activities.  Pt should benefit from skilled PT services to address impairments and improve overall function.      OBJECTIVE IMPAIRMENTS Abnormal gait, decreased activity tolerance, decreased endurance, decreased mobility, difficulty walking,  decreased ROM, decreased strength, hypomobility, and pain.   ACTIVITY LIMITATIONS standing, squatting, stairs, transfers, and locomotion level  PARTICIPATION LIMITATIONS: occupation  [place] 1 comorbidity: DM  are also affecting patient's functional outcome.   REHAB POTENTIAL: Good  CLINICAL DECISION MAKING: Stable/uncomplicated  EVALUATION COMPLEXITY: Low   GOALS:  SHORT TERM GOALS: Target date: 06/17/2022 Pt will be independent and compliant with HEP for improved pain, strength, and function.  Baseline: Goal status: INITIAL  2.  Pt will report improved stability with reduced buckling with daily mobility.  Baseline:  Goal status: INITIAL  3.  Pt will report at least a 25% improvement in pain and sx's overall.  Baseline:  Goal status: INITIAL  4.  Pt will report she is able to perform transfers without significant pain.  Baseline:  Goal status: INITIAL Target date:  06/14/2022  LONG TERM GOALS: Target date: 07/08/2022  Pt will ambulate with improved TKE, increased Wb'ing thru L LE, and reduced antalgic limp.  Baseline:  Goal status: INITIAL  2.  Pt will report she is able to perform extended community ambulation without significant pain or difficulty.  Baseline:  Goal status: INITIAL  3.  Pt will be able to perform her normal work activities without significant pain or limitation. Baseline:  Goal status: INITIAL  4.  Pt will demo improved strength to 5/5 in L hip flexion, knee extension and 4+/5 in L hip abduction, extension, and knee flexion for improved performance of and tolerance with daily mobility and work activities.  Baseline:  Goal status: INITIAL     PLAN: PT FREQUENCY: 2x/week  PT DURATION: 6 weeks  PLANNED INTERVENTIONS: Therapeutic exercises, Therapeutic activity, Neuromuscular re-education, Balance training, Gait training, Patient/Family education, Joint mobilization, Stair training, Aquatic Therapy, Dry Needling, Electrical stimulation,  Cryotherapy, Moist heat, Taping, Ultrasound, Manual therapy, and Re-evaluation  PLAN FOR NEXT SESSION: Review and perform HEP.  Cont with progressing L hip and knee strength.     Selinda Michaels III PT, DPT 05/28/22 11:37 PM

## 2022-06-03 ENCOUNTER — Other Ambulatory Visit (HOSPITAL_COMMUNITY): Payer: Self-pay

## 2022-06-03 ENCOUNTER — Telehealth: Payer: 59 | Admitting: Physician Assistant

## 2022-06-03 DIAGNOSIS — J069 Acute upper respiratory infection, unspecified: Secondary | ICD-10-CM

## 2022-06-03 MED ORDER — BENZONATATE 100 MG PO CAPS
100.0000 mg | ORAL_CAPSULE | Freq: Three times a day (TID) | ORAL | 0 refills | Status: DC | PRN
  Filled 2022-06-03: qty 30, 10d supply, fill #0

## 2022-06-03 MED ORDER — IPRATROPIUM BROMIDE 0.03 % NA SOLN
2.0000 | Freq: Two times a day (BID) | NASAL | 0 refills | Status: DC
  Filled 2022-06-03: qty 30, 43d supply, fill #0

## 2022-06-03 NOTE — Progress Notes (Signed)
E-Visit for Upper Respiratory Infection  ° °We are sorry you are not feeling well.  Here is how we plan to help! ° °Based on what you have shared with me, it looks like you Melnyk have a viral upper respiratory infection.  Upper respiratory infections are caused by a large number of viruses; however, rhinovirus is the most common cause.  ° °Symptoms vary from person to person, with common symptoms including sore throat, cough, fatigue or lack of energy and feeling of general discomfort.  A low-grade fever of up to 100.4 Srinivasan present, but is often uncommon.  Symptoms vary however, and are closely related to a person's age or underlying illnesses.  The most common symptoms associated with an upper respiratory infection are nasal discharge or congestion, cough, sneezing, headache and pressure in the ears and face.  These symptoms usually persist for about 3 to 10 days, but can last up to 2 weeks.  It is important to know that upper respiratory infections do not cause serious illness or complications in most cases.   ° °Upper respiratory infections can be transmitted from person to person, with the most common method of transmission being a person's hands.  The virus is able to live on the skin and can infect other persons for up to 2 hours after direct contact.  Also, these can be transmitted when someone coughs or sneezes; thus, it is important to cover the mouth to reduce this risk.  To keep the spread of the illness at bay, good hand hygiene is very important. ° °This is an infection that is most likely caused by a virus. There are no specific treatments other than to help you with the symptoms until the infection runs its course.  We are sorry you are not feeling well.  Here is how we plan to help! ° ° °For nasal congestion, you Monahan use an oral decongestants such as Mucinex D or if you have glaucoma or high blood pressure use plain Mucinex.  Saline nasal spray or nasal drops can help and can safely be used as often as  needed for congestion.  For your congestion, I have prescribed Ipratropium Bromide nasal spray 0.03% two sprays in each nostril 2-3 times a day ° °If you do not have a history of heart disease, hypertension, diabetes or thyroid disease, prostate/bladder issues or glaucoma, you Saldivar also use Sudafed to treat nasal congestion.  It is highly recommended that you consult with a pharmacist or your primary care physician to ensure this medication is safe for you to take.    ° °If you have a cough, you Harwick use cough suppressants such as Delsym and Robitussin.  If you have glaucoma or high blood pressure, you can also use Coricidin HBP.   °For cough I have prescribed for you A prescription cough medication called Tessalon Perles 100 mg. You Sires take 1-2 capsules every 8 hours as needed for cough ° °If you have a sore or scratchy throat, use a saltwater gargle- ¼ to ½ teaspoon of salt dissolved in a 4-ounce to 8-ounce glass of warm water.  Gargle the solution for approximately 15-30 seconds and then spit.  It is important not to swallow the solution.  You can also use throat lozenges/cough drops and Chloraseptic spray to help with throat pain or discomfort.  Warm or cold liquids can also be helpful in relieving throat pain. ° °For headache, pain or general discomfort, you can use Ibuprofen or Tylenol as directed.   °  Some authorities believe that zinc sprays or the use of Echinacea Rudnicki shorten the course of your symptoms. ° ° °HOME CARE °Only take medications as instructed by your medical team. °Be sure to drink plenty of fluids. Water is fine as well as fruit juices, sodas and electrolyte beverages. You Rowand want to stay away from caffeine or alcohol. If you are nauseated, try taking small sips of liquids. How do you know if you are getting enough fluid? Your urine should be a pale yellow or almost colorless. °Get rest. °Taking a steamy shower or using a humidifier Pedley help nasal congestion and ease sore throat pain. You can  place a towel over your head and breathe in the steam from hot water coming from a faucet. °Using a saline nasal spray works much the same way. °Cough drops, hard candies and sore throat lozenges Gruen[place]ald ease your cough. °Avoid close contacts especially the very young and the elderly °Cover your mouth if you cough or sneeze °Always remember to wash your hands.  ° °GET HELP RIGHT AWAY IF: °You develop worsening fever. °If your symptoms do not improve within 10 days °You develop yellow or green discharge from your nose over 3 days. °You have coughing fits °You develop a severe head ache or visual changes. °You develop shortness of breath, difficulty breathing or start having chest pain °Your symptoms persist after you have completed your treatment plan ° °MAKE SURE YOU  °Understand these instructions. °Will watch your condition. °Will get help right away if you are not doing well or get worse. ° °Thank you for choosing an e-visit. ° °Your e-visit answers were reviewed by a board certified advanced clinical practitioner to complete your personal care plan. Depending upon the condition, your plan could have included both over the counter or prescription medications. ° °Please review your pharmacy choice. Make sure the pharmacy is open so you can pick up prescription now. If there is a problem, you Goodell contact your provider through MyChart messaging and have the prescription routed to another pharmacy.  Your safety is important to us. If you have drug allergies check your prescription carefully.  ° °For the next 24 hours you can use MyChart to ask questions about today's visit, request a non-urgent call back, or ask for a work or school excuse. °You will get an email in the next two days asking about your experience. I hope that your e-visit has been valuable and will speed your recovery. ° ° °I provided 5 minutes of non face-to-face time during this encounter for chart review and documentation.  ° °

## 2022-06-05 ENCOUNTER — Encounter (HOSPITAL_BASED_OUTPATIENT_CLINIC_OR_DEPARTMENT_OTHER): Payer: 59 | Admitting: Physical Therapy

## 2022-06-10 ENCOUNTER — Encounter (HOSPITAL_BASED_OUTPATIENT_CLINIC_OR_DEPARTMENT_OTHER): Payer: 59 | Admitting: Physical Therapy

## 2022-06-11 ENCOUNTER — Telehealth: Payer: 59

## 2022-06-12 ENCOUNTER — Encounter (HOSPITAL_BASED_OUTPATIENT_CLINIC_OR_DEPARTMENT_OTHER): Payer: Self-pay | Admitting: Physical Therapy

## 2022-06-12 ENCOUNTER — Ambulatory Visit (HOSPITAL_BASED_OUTPATIENT_CLINIC_OR_DEPARTMENT_OTHER): Payer: 59 | Attending: Orthopaedic Surgery | Admitting: Physical Therapy

## 2022-06-12 DIAGNOSIS — R278 Other lack of coordination: Secondary | ICD-10-CM | POA: Diagnosis not present

## 2022-06-12 DIAGNOSIS — M25562 Pain in left knee: Secondary | ICD-10-CM | POA: Insufficient documentation

## 2022-06-12 DIAGNOSIS — M6281 Muscle weakness (generalized): Secondary | ICD-10-CM | POA: Diagnosis not present

## 2022-06-12 DIAGNOSIS — R262 Difficulty in walking, not elsewhere classified: Secondary | ICD-10-CM | POA: Insufficient documentation

## 2022-06-12 NOTE — Therapy (Signed)
OUTPATIENT PHYSICAL THERAPY LOWER EXTREMITY TREATMENT    Patient Name: Michelle Ray MRN: [id number] DOB:[birth year], 56 y.o., female Today's Date: 06/12/2022   PT End of Session - 06/12/22 1022     Visit Number 2    Number of Visits 12    Date for PT Re-Evaluation 07/08/22    Authorization Type [place] [employer]    PT Start Time 1016    PT Stop Time 1055    PT Time Calculation (min) 39 min    Activity Tolerance Patient tolerated treatment well    Behavior During Therapy WFL for tasks assessed/performed              Past Medical History:  Diagnosis Date   Allergy    Anemia    Aortic atherosclerosis ([place])    Benign secondary hypertension due to renal artery stenosis (HCC)    Constipation    uses OTC stool softener prn only- this is not chronic   Diabetes mellitus without complication (HCC)    GERD (gastroesophageal reflux disease)    Hyperlipemia    Hyperlipidemia    Hypertension    Past Surgical History:  Procedure Laterality Date   ABLATION     ENDOVENOUS ABLATION SAPHENOUS VEIN W/ LASER Right 01/28/2022   endovenous laser ablation right greater saphenous vein and stab phlebectomy 10-20 incisions right leg by Gae Gallop MD   TUBAL LIGATION     uterine fibroid removal     WISDOM TOOTH EXTRACTION     Patient Active Problem List   Diagnosis Date Noted   RLS (restless legs syndrome) 03/06/2022   Fibromyalgia muscle pain 03/06/2022   Small right kidney 08/29/2021   Atrophy of right kidney 08/29/2021   Varicose veins of both legs with edema 02/17/2021   Stress incontinence in female 02/17/2021   Pain and swelling of right ankle 02/17/2021   Controlled type 2 diabetes mellitus with complication, without long-term current use of insulin ([place]) 02/17/2021   Hyperlipidemia associated with type 2 diabetes mellitus ([place]) 02/17/2021   Right upper quadrant abdominal pain 02/17/2021   BMI 40.0-44.9, adult ([place]) 02/17/2021   Aortic atherosclerosis ([place]) 02/17/2021    Chronic thumb pain, bilateral 02/17/2021   Hypertension associated with diabetes ([place]) 05/29/2016    PCP: Jacolyn Reedy, NP  REFERRING PROVIDER: Vanetta Mulders, MD    REFERRING DIAG: M17.12 (ICD-10-CM) - Unilateral primary osteoarthritis, left knee   THERAPY DIAG:  Left knee pain, unspecified chronicity  Muscle weakness (generalized)  Difficulty in walking, not elsewhere classified  Other lack of coordination  Rationale for Evaluation and Treatment Rehabilitation  ONSET DATE: Exacerbation January 2023  SUBJECTIVE:   I'm feeling a bit better, I still tend to hurt when I have to do a lot of heavy lifting at work. HEP is going well.   SUBJECTIVE STATEMENT: Pt states she noticed pain in L knee about 1.5 years ago without any specific injury.  Pt reports pain getting worse approx 5-6 months ago and having crepitus when performing stairs.  Pt has not received treatment for knee until seeing Dr. Sammuel Hines on 04/22/2022.  Pt had x rays and MD note indicated mild OA.  MD ordered PT and order indicated L knee quad and hip strengthening.    Pt c/o's of her knee wanting to give way and buckles occasionally.  Her knee has buckled approx 3 times in the past couple of weeks, though she has not fallen. Pt has pain with functional mobility skills including ambulation, transfers, and stairs.  Pt  is limited with ambulation distance.  She has more pain and difficulty with ascending than descending stairs.  Pt has pain with work activities.   Pt feels better after riding recumbent bike for 5 mins at work.  Easing Factors:  Tylenol, Ibuprofen, ice and elevation, rest  PERTINENT HISTORY: DM, bilat foot pain (has bone spurs per pt)  PAIN:  Are you having pain? Yes NPRS:  Current:  2/10, Best:  1/10, Worst:  7-8/10 Type:  dull ache, can be sharp.  Constant pain Location:  L knee, more medial  PRECAUTIONS: Other: per dx  WEIGHT BEARING RESTRICTIONS No  FALLS:  Has patient fallen in last 6 months?  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  Pt is on her feet most of the day 2/3 days per week.    PLOF: Independent; pt was able to perform all of her daily activities, work activities, and functional mobility with less pain.    PATIENT GOALS reduce pain, improved walking with less pain  TREATMENT 06/12/22  Nustep L4 x6 minutes BLEs only   Bridges + clam with red TB 1x15 Sidelying clams with red TB 1x10 B Prone bent knee hip extensions 1x10 B Walking bridges 1x10   STS with red TB above knees 1x10  Standing hip hikes with and without swing 2x10 B  Lateral stepping with red TB above knees [id number]  Forward and lateral step ups 1x10 B 4 inch step  Eccentric step downs 4 inch step U UE support 1x5 B     OBJECTIVE:   DIAGNOSTIC FINDINGS:  Xray (per MD note): There is mild medial joint space narrowing consistent with early osteoarthritis X ray (per Epic): IMPRESSION: 1. No acute fracture or dislocation. 2. Small joint effusion. 3. Mild degenerative changes of the medial compartment.  PATIENT SURVEYS:  FOTO 56 with a goal of 68 at visit #11  COGNITION:  Overall cognitive status: Within functional limits for tasks assessed      PALPATION: TTP:  medial, post, and ant L knee.  L quad  LOWER EXTREMITY ROM:  Active ROM LEFT eval RIGHT eval  Hip flexion    [facility] Hip adduction    Hip internal rotation    Hip external rotation    Knee flexion 116 "uncomfortable" 115  Knee extension 0 "feels uncomfortable" 0  Ankle dorsiflexion    Ankle plantarflexion    Ankle inversion    Ankle eversion     (Blank rows = not tested)  LOWER EXTREMITY MMT:  MMT Right eval Left eval  Hip flexion 5/5 4/5  Hip extension 4/5 4/5  Hip abduction 4+/5 3+/5   Hip adduction    Hip internal rotation    Hip external rotation 5/5 4+/5  Knee flexion 4+/5 Unable to tolerate resistance  Knee extension 5/5 4-/5  Ankle dorsiflexion    Ankle plantarflexion    Ankle inversion    Ankle eversion     (Blank rows = not tested)   GAIT: Assistive device utilized: None Level of assistance: Independent Comments: antalgic gait, decreased gait speed, decreased stance time/Wb'ing thru L LE, limited TKE    TODAY'S TREATMENT: Pt performed supine SLR, supine clams, supine bridge, quad sets with 5sec hold, and supine heel slide all x10  reps.  Pt received a HEP handout and was educated in correct form and appropriate frequency.  Pt received a HEP handout and was educated in correct form and appropriate frequency.  Pt instructed she should not have increased pain with HEP.      PATIENT EDUCATION:  Education details: PT instructed pt in continuing with bike at work.  HEP, POC, dx, relevant anatomy, objective findings, and rationale of exercises.  Person educated: Patient Education method: Explanation, Demonstration, Tactile cues, Verbal cues, and Handouts Education comprehension: verbalized understanding, returned demonstration, verbal cues required, tactile cues required, and needs further education   HOME EXERCISE PROGRAM: Access Code: [email or web address] URL: [email or web address] Date: 05/27/2022 Prepared by: Ronny Flurry  Exercises - Supine Quadricep Sets  - 2 x daily - 7 x weekly - 2 sets - 10 reps - 5 seconds hold - Supine Active Straight Leg Raise  - 1 x daily - 5-6 x weekly - 2 sets - 10 reps - Supine Bridge  - 1 x daily - 5-6 x weekly - 2 sets - 10 reps - Hooklying Clamshell with Resistance  - 1 x daily - 4-5 x weekly - 2 sets - 10 reps - Supine Heel Slide  - 2 x daily - 7 x weekly - 2 sets - 10 reps  ASSESSMENT:  CLINICAL IMPRESSION:   Alizeh arrives doing OK today, missed a couple of therapy sessions due to illness but feeling better  now. Warmed up on the Nustep then focused on targeted hip/core strengthening and progressed to standing CKC exercises as tolerated.   OBJECTIVE IMPAIRMENTS Abnormal gait, decreased activity tolerance, decreased endurance, decreased mobility, difficulty walking, decreased ROM, decreased strength, hypomobility, and pain.   ACTIVITY LIMITATIONS standing, squatting, stairs, transfers, and locomotion level  PARTICIPATION LIMITATIONS: occupation  [place] 1 comorbidity: DM  are also affecting patient's functional outcome.   REHAB POTENTIAL: Good  CLINICAL DECISION MAKING: Stable/uncomplicated  EVALUATION COMPLEXITY: Low   GOALS:  SHORT TERM GOALS: Target date: 06/17/2022 Pt will be independent and compliant with HEP for improved pain, strength, and function.  Baseline: Goal status: INITIAL  2.  Pt will report improved stability with reduced buckling with daily mobility.  Baseline:  Goal status: INITIAL  3.  Pt will report at least a 25% improvement in pain and sx's overall.  Baseline:  Goal status: INITIAL  4.  Pt will report she is able to perform transfers without significant pain.  Baseline:  Goal status: INITIAL Target date:  06/14/2022  LONG TERM GOALS: Target date: 07/08/2022  Pt will ambulate with improved TKE, increased Wb'ing thru L LE, and reduced antalgic limp.  Baseline:  Goal status: INITIAL  2.  Pt will report she is able to perform extended community ambulation without significant pain or difficulty.  Baseline:  Goal status: INITIAL  3.  Pt will be able to perform her normal work activities without significant pain or limitation. Baseline:  Goal status: INITIAL  4.  Pt will demo improved strength to 5/5 in L hip flexion, knee extension and 4+/5 in L hip abduction, extension, and knee flexion for improved performance of and tolerance with daily mobility and work activities.  Baseline:  Goal status: INITIAL     PLAN: PT FREQUENCY: 2x/week  PT  DURATION: 6 weeks  PLANNED INTERVENTIONS: Therapeutic exercises, Therapeutic activity, Neuromuscular re-education, Balance training, Gait training, Patient/Family education, Joint mobilization, Stair training, Aquatic Therapy, Dry Needling, Electrical stimulation, Cryotherapy, Moist heat, Taping, Ultrasound, Manual therapy, and Re-evaluation  PLAN FOR NEXT  SESSION: Review and perform HEP.  Cont with progressing L hip and knee strength.     Ann Lions PT DPT PN2  06/12/2022, 10:56 AM

## 2022-06-15 ENCOUNTER — Other Ambulatory Visit (HOSPITAL_COMMUNITY): Payer: Self-pay

## 2022-06-15 ENCOUNTER — Encounter (HOSPITAL_BASED_OUTPATIENT_CLINIC_OR_DEPARTMENT_OTHER): Payer: Self-pay | Admitting: Nurse Practitioner

## 2022-06-15 DIAGNOSIS — E782 Mixed hyperlipidemia: Secondary | ICD-10-CM

## 2022-06-15 DIAGNOSIS — E118 Type 2 diabetes mellitus with unspecified complications: Secondary | ICD-10-CM

## 2022-06-15 DIAGNOSIS — I10 Essential (primary) hypertension: Secondary | ICD-10-CM

## 2022-06-15 DIAGNOSIS — Z6841 Body Mass Index (BMI) 40.0 and over, adult: Secondary | ICD-10-CM

## 2022-06-15 DIAGNOSIS — I700 Atherosclerosis of aorta: Secondary | ICD-10-CM

## 2022-06-15 MED ORDER — TIRZEPATIDE 15 MG/0.5ML ~~LOC~~ SOAJ
15.0000 mg | SUBCUTANEOUS | 6 refills | Status: DC
  Filled 2022-06-15: qty 6, 84d supply, fill #0
  Filled 2022-06-26: qty 2, 28d supply, fill #0
  Filled 2022-07-28: qty 2, 28d supply, fill #1
  Filled 2022-08-28: qty 2, 28d supply, fill #2
  Filled 2022-10-01: qty 2, 28d supply, fill #3
  Filled 2022-10-29: qty 2, 28d supply, fill #4
  Filled 2022-12-07: qty 2, 28d supply, fill #5
  Filled 2023-01-08: qty 2, 28d supply, fill #6
  Filled 2023-02-09: qty 2, 28d supply, fill #7
  Filled 2023-04-07: qty 2, 28d supply, fill #8

## 2022-06-16 ENCOUNTER — Ambulatory Visit (HOSPITAL_BASED_OUTPATIENT_CLINIC_OR_DEPARTMENT_OTHER): Payer: 59 | Admitting: Physical Therapy

## 2022-06-16 ENCOUNTER — Encounter (HOSPITAL_BASED_OUTPATIENT_CLINIC_OR_DEPARTMENT_OTHER): Payer: Self-pay | Admitting: Physical Therapy

## 2022-06-16 DIAGNOSIS — M6281 Muscle weakness (generalized): Secondary | ICD-10-CM | POA: Diagnosis not present

## 2022-06-16 DIAGNOSIS — M25562 Pain in left knee: Secondary | ICD-10-CM

## 2022-06-16 DIAGNOSIS — R262 Difficulty in walking, not elsewhere classified: Secondary | ICD-10-CM | POA: Diagnosis not present

## 2022-06-16 DIAGNOSIS — R278 Other lack of coordination: Secondary | ICD-10-CM | POA: Diagnosis not present

## 2022-06-16 NOTE — Therapy (Addendum)
OUTPATIENT PHYSICAL THERAPY LOWER EXTREMITY TREATMENT    Patient Name: Michelle Ray MRN: [id number] DOB:1966-10-05, 56 y.o., female Today's Date: 06/16/2022   PT End of Session - 06/16/22 1050     Visit Number 3    Number of Visits 12    Date for PT Re-Evaluation 07/08/22    Authorization Type [facility] [employer]    PT Start Time 1028   Pt arrived 10 mins late to Rx.   PT Stop Time 1103    PT Time Calculation (min) 35 min    Activity Tolerance Patient tolerated treatment well    Behavior During Therapy WFL for tasks assessed/performed               Past Medical History:  Diagnosis Date   Allergy    Anemia    Aortic atherosclerosis (HCC)    Benign secondary hypertension due to renal artery stenosis (HCC)    Constipation    uses OTC stool softener prn only- this is not chronic   Diabetes mellitus without complication (HCC)    GERD (gastroesophageal reflux disease)    Hyperlipemia    Hyperlipidemia    Hypertension    Past Surgical History:  Procedure Laterality Date   ABLATION     ENDOVENOUS ABLATION SAPHENOUS VEIN W/ LASER Right 01/28/2022   endovenous laser ablation right greater saphenous vein and stab phlebectomy 10-20 incisions right leg by Gae Gallop MD   TUBAL LIGATION     uterine fibroid removal     WISDOM TOOTH EXTRACTION     Patient Active Problem List   Diagnosis Date Noted   RLS (restless legs syndrome) 03/06/2022   Fibromyalgia muscle pain 03/06/2022   Small right kidney 08/29/2021   Atrophy of right kidney 08/29/2021   Varicose veins of both legs with edema 02/17/2021   Stress incontinence in female 02/17/2021   Pain and swelling of right ankle 02/17/2021   Controlled type 2 diabetes mellitus with complication, without long-term current use of insulin ([place]) 02/17/2021   Hyperlipidemia associated with type 2 diabetes mellitus ([place]) 02/17/2021   Right upper quadrant abdominal pain 02/17/2021   BMI 40.0-44.9, adult ([place]) 02/17/2021   Aortic  atherosclerosis ([place]) 02/17/2021   Chronic thumb pain, bilateral 02/17/2021   Hypertension associated with diabetes ([place]) 05/29/2016    PCP: Jacolyn Reedy, NP  REFERRING PROVIDER: Vanetta Mulders, MD    REFERRING DIAG: M17.12 (ICD-10-CM) - Unilateral primary osteoarthritis, left knee   THERAPY DIAG:  Left knee pain, unspecified chronicity  Muscle weakness (generalized)  Difficulty in walking, not elsewhere classified  Rationale for Evaluation and Treatment Rehabilitation  ONSET DATE: Exacerbation January 2023  SUBJECTIVE:    SUBJECTIVE STATEMENT: Pt denies any adverse effects after prior Rx.  Pt reports compliance with HEP.  "Yesterday was a pretty good day at work".  Pt reports she had to do a lot of lifting, pushing, pulling at work and has increased pain all over today.  Pt reports walking is easier and she is walking faster.  She continues to have difficulty with stairs.  Pt has pain with functional mobility skills including ambulation, transfers, and stairs.  Pt is limited with ambulation distance.  Pt has pain with work activities.   Pt feels better after riding recumbent bike for 5 mins at work.  Easing Factors:  Tylenol, Ibuprofen, ice and elevation, rest  PERTINENT HISTORY: DM, bilat foot pain (has bone spurs per pt)  PAIN:  Are you having pain? Yes NPRS:  Current:  2/10, Best:  1/10, Worst:  7-8/10 Type:  dull ache, can be sharp.  Constant pain Location:  L knee, more medial  PRECAUTIONS: Other: per dx  WEIGHT BEARING RESTRICTIONS No  FALLS:  Has patient fallen in last 6 months? 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  Pt is on her feet most of the day 2/3 days per week.    PLOF: Independent; pt was able to perform all of her daily activities, work activities, and  functional mobility with less pain.    PATIENT GOALS reduce pain, improved walking with less pain      OBJECTIVE:   DIAGNOSTIC FINDINGS:  Xray (per MD note): There is mild medial joint space narrowing consistent with early osteoarthritis X ray (per Epic): IMPRESSION: 1. No acute fracture or dislocation. 2. Small joint effusion. 3. Mild degenerative changes of the medial compartment.    TODAY'S TREATMENT:  Therapeutic Exercise: -Reviewed HEP compliance, current function, response to prior Rx, and pain level.  -Reviewed HEP. -Pt performed: Nustep L4 x5 minutes BLEs only  Supine SLR 2x10 Supine bridge x 10, walking bridges x 10 Supine clams with GTB x10 Bridges + clam with GTB 1x15 Sidelying clams with red TB 2x10 B S/L hip abd 2x10 LAQ with 2# 2x10 Seated HS curl with RTB 2x10 STS with red TB above knees 1x10  Lateral stepping with red TB above knees x 2 laps Forward and lateral step ups 1x10 on L 4 inch step   PATIENT EDUCATION:  Education details: PT instructed pt in continuing with bike at work.  HEP, POC, exercise form, relevant anatomy, and rationale of exercises.  Person educated: Patient Education method: Explanation, Demonstration, Tactile cues, Verbal cues Education comprehension: verbalized understanding, returned demonstration, verbal cues required, tactile cues required, and needs further education   HOME EXERCISE PROGRAM: Access Code: [email or web address] URL: [email or web address] Date: 05/27/2022 Prepared by: Ronny Flurry  Exercises - Supine Quadricep Sets  - 2 x daily - 7 x weekly - 2 sets - 10 reps - 5 seconds hold - Supine Active Straight Leg Raise  - 1 x daily - 5-6 x weekly - 2 sets - 10 reps - Supine Bridge  - 1 x daily - 5-6 x weekly - 2 sets - 10 reps - Hooklying Clamshell with Resistance  - 1 x daily - 4-5 x weekly - 2 sets - 10 reps - Supine Heel Slide  - 2 x daily - 7 x weekly - 2 sets - 10 reps  ASSESSMENT:  CLINICAL  IMPRESSION: Pt presents to Rx reporting improved ambulation and she is able to walk faster.  Pt is compliant with HEP.  She performed exercises well with cuing and instruction for correct form and positioning.  Pt tolerated open and closed chain activities without c/o's.  She responded well to Rx and reported minimally increased pain to 2.5-3/10 after Rx.  Pt should benefit from cont skilled PT services to address goals, improve strength, and assist in restoring desired level of function.     OBJECTIVE IMPAIRMENTS Abnormal gait, decreased activity tolerance, decreased endurance, decreased mobility, difficulty walking, decreased ROM, decreased strength, hypomobility, and pain.   ACTIVITY LIMITATIONS standing, squatting, stairs, transfers, and locomotion level  PARTICIPATION LIMITATIONS: occupation  [place] 1 comorbidity: DM  are also affecting patient's functional outcome.  REHAB POTENTIAL: Good  CLINICAL DECISION MAKING: Stable/uncomplicated  EVALUATION COMPLEXITY: Low   GOALS:  SHORT TERM GOALS: Target date: 06/17/2022 Pt will be independent and compliant with HEP for improved pain, strength, and function.  Baseline: Goal status: INITIAL  2.  Pt will report improved stability with reduced buckling with daily mobility.  Baseline:  Goal status: INITIAL  3.  Pt will report at least a 25% improvement in pain and sx's overall.  Baseline:  Goal status: INITIAL  4.  Pt will report she is able to perform transfers without significant pain.  Baseline:  Goal status: INITIAL Target date:  06/14/2022  LONG TERM GOALS: Target date: 07/08/2022  Pt will ambulate with improved TKE, increased Wb'ing thru L LE, and reduced antalgic limp.  Baseline:  Goal status: INITIAL  2.  Pt will report she is able to perform extended community ambulation without significant pain or difficulty.  Baseline:  Goal status: INITIAL  3.  Pt will be able to perform her normal work activities without  significant pain or limitation. Baseline:  Goal status: INITIAL  4.  Pt will demo improved strength to 5/5 in L hip flexion, knee extension and 4+/5 in L hip abduction, extension, and knee flexion for improved performance of and tolerance with daily mobility and work activities.  Baseline:  Goal status: INITIAL     PLAN: PT FREQUENCY: 2x/week  PT DURATION: 6 weeks  PLANNED INTERVENTIONS: Therapeutic exercises, Therapeutic activity, Neuromuscular re-education, Balance training, Gait training, Patient/Family education, Joint mobilization, Stair training, Aquatic Therapy, Dry Needling, Electrical stimulation, Cryotherapy, Moist heat, Taping, Ultrasound, Manual therapy, and Re-evaluation  PLAN FOR NEXT SESSION:  Cont with progressing L hip and knee strength.     Selinda Michaels III PT, DPT 06/16/22 11:22 PM

## 2022-06-18 ENCOUNTER — Ambulatory Visit (HOSPITAL_BASED_OUTPATIENT_CLINIC_OR_DEPARTMENT_OTHER): Payer: 59 | Admitting: Physical Therapy

## 2022-06-18 ENCOUNTER — Encounter (HOSPITAL_BASED_OUTPATIENT_CLINIC_OR_DEPARTMENT_OTHER): Payer: Self-pay | Admitting: Physical Therapy

## 2022-06-18 DIAGNOSIS — R278 Other lack of coordination: Secondary | ICD-10-CM

## 2022-06-18 DIAGNOSIS — M25562 Pain in left knee: Secondary | ICD-10-CM

## 2022-06-18 DIAGNOSIS — R262 Difficulty in walking, not elsewhere classified: Secondary | ICD-10-CM | POA: Diagnosis not present

## 2022-06-18 DIAGNOSIS — M6281 Muscle weakness (generalized): Secondary | ICD-10-CM

## 2022-06-18 NOTE — Therapy (Signed)
OUTPATIENT PHYSICAL THERAPY LOWER EXTREMITY TREATMENT    Patient Name: Michelle Ray MRN: [id number] DOB:Jul 12, 1966, 56 y.o., female Today's Date: 06/18/2022   PT End of Session - 06/18/22 1128     Visit Number 4    Number of Visits 12    Date for PT Re-Evaluation 07/08/22    Authorization Type [facility] [employer]    PT Start Time 1103    PT Stop Time 1142    PT Time Calculation (min) 39 min    Activity Tolerance Patient tolerated treatment well    Behavior During Therapy WFL for tasks assessed/performed                Past Medical History:  Diagnosis Date   Allergy    Anemia    Aortic atherosclerosis ([place])    Benign secondary hypertension due to renal artery stenosis (HCC)    Constipation    uses OTC stool softener prn only- this is not chronic   Diabetes mellitus without complication (HCC)    GERD (gastroesophageal reflux disease)    Hyperlipemia    Hyperlipidemia    Hypertension    Past Surgical History:  Procedure Laterality Date   ABLATION     ENDOVENOUS ABLATION SAPHENOUS VEIN W/ LASER Right 01/28/2022   endovenous laser ablation right greater saphenous vein and stab phlebectomy 10-20 incisions right leg by Gae Gallop MD   TUBAL LIGATION     uterine fibroid removal     WISDOM TOOTH EXTRACTION     Patient Active Problem List   Diagnosis Date Noted   RLS (restless legs syndrome) 03/06/2022   Fibromyalgia muscle pain 03/06/2022   Small right kidney 08/29/2021   Atrophy of right kidney 08/29/2021   Varicose veins of both legs with edema 02/17/2021   Stress incontinence in female 02/17/2021   Pain and swelling of right ankle 02/17/2021   Controlled type 2 diabetes mellitus with complication, without long-term current use of insulin ([place]) 02/17/2021   Hyperlipidemia associated with type 2 diabetes mellitus ([place]) 02/17/2021   Right upper quadrant abdominal pain 02/17/2021   BMI 40.0-44.9, adult ([place]) 02/17/2021   Aortic atherosclerosis ([place]) 02/17/2021    Chronic thumb pain, bilateral 02/17/2021   Hypertension associated with diabetes ([place]) 05/29/2016    PCP: Jacolyn Reedy, NP  REFERRING PROVIDER: Vanetta Mulders, MD    REFERRING DIAG: M17.12 (ICD-10-CM) - Unilateral primary osteoarthritis, left knee   THERAPY DIAG:  Left knee pain, unspecified chronicity  Muscle weakness (generalized)  Difficulty in walking, not elsewhere classified  Other lack of coordination  Rationale for Evaluation and Treatment Rehabilitation  ONSET DATE: Exacerbation January 2023  SUBJECTIVE:    SUBJECTIVE STATEMENT: Doing well, felt good after last session and time before   Pt feels better after riding recumbent bike for 5 mins at work.  Easing Factors:  Tylenol, Ibuprofen, ice and elevation, rest  PERTINENT HISTORY: DM, bilat foot pain (has bone spurs per pt)  PAIN:  Are you having pain? Yes NPRS:  Current:  3/10, Best:  1/10, Worst:  7-8/10 Type:  dull ache, can be sharp.  Constant pain Location:  L knee, more medial  PRECAUTIONS: Other: per dx  WEIGHT BEARING RESTRICTIONS No  FALLS:  Has patient fallen in last 6 months? 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  Pt is on her feet most of the day 2/3 days per week.    PLOF: Independent; pt was able to perform all of her daily activities, work activities, and functional mobility with less pain.    PATIENT GOALS reduce pain, improved walking with less pain      OBJECTIVE:   DIAGNOSTIC FINDINGS:  Xray (per MD note): There is mild medial joint space narrowing consistent with early osteoarthritis X ray (per Epic): IMPRESSION: 1. No acute fracture or dislocation. 2. Small joint effusion. 3. Mild degenerative changes of the medial compartment.    TODAY'S TREATMENT 06/18/22  Nustep L4x6  minutes BLEs only   Supine clams blue TB 1x10 3 second holds  Bridge + clam x10 blue TB  Sidelying clams 1x10 B  blue TB  Walking bridges 1x10  Prone hip extensions 1x10 B Seated LAQs green band 1x10 B HS curls green TB 1x10 B  STS green TB above knees 1x15 Mini squat holds in front of mat table 1x10  3 way hip 1x10 B green TB above knees  Lateral stepping with green TB above knees alternating with backwards monster walking with green TB above knees x2 laps each Forward and lateral step ups on 4 inch box x10 B        PATIENT EDUCATION:  Education details: PT instructed pt in continuing with bike at work.  HEP, POC, exercise form, relevant anatomy, and rationale of exercises.  Person educated: Patient Education method: Explanation, Demonstration, Tactile cues, Verbal cues Education comprehension: verbalized understanding, returned demonstration, verbal cues required, tactile cues required, and needs further education   HOME EXERCISE PROGRAM: Access Code: [email or web address] URL: [email or web address] Date: 05/27/2022 Prepared by: Ronny Flurry  Exercises - Supine Quadricep Sets  - 2 x daily - 7 x weekly - 2 sets - 10 reps - 5 seconds hold - Supine Active Straight Leg Raise  - 1 x daily - 5-6 x weekly - 2 sets - 10 reps - Supine Bridge  - 1 x daily - 5-6 x weekly - 2 sets - 10 reps - Hooklying Clamshell with Resistance  - 1 x daily - 4-5 x weekly - 2 sets - 10 reps - Supine Heel Slide  - 2 x daily - 7 x weekly - 2 sets - 10 reps  ASSESSMENT:  CLINICAL IMPRESSION:  Shanon arrives today doing well, has been feeling well at work but still having medial L knee pain. Continued to progress strengthening in [facility] and [place] positions as able today. HEP updates provided today.   OBJECTIVE IMPAIRMENTS Abnormal gait, decreased activity tolerance, decreased endurance, decreased mobility, difficulty walking, decreased ROM, decreased strength, hypomobility, and pain.   ACTIVITY  LIMITATIONS standing, squatting, stairs, transfers, and locomotion level  PARTICIPATION LIMITATIONS: occupation  [place] 1 comorbidity: DM  are also affecting patient's functional outcome.   REHAB POTENTIAL: Good  CLINICAL DECISION MAKING: Stable/uncomplicated  EVALUATION COMPLEXITY: Low   GOALS:  SHORT TERM GOALS: Target date: 06/17/2022 Pt will be independent and compliant with HEP for improved pain, strength, and function.  Baseline: Goal status: INITIAL  2.  Pt will report improved stability with reduced buckling with daily mobility.  Baseline:  Goal status: INITIAL  3.  Pt will report at least a 25% improvement in pain and sx's overall.  Baseline:  Goal status: INITIAL  4.  Pt will report she is able to perform transfers without significant pain.  Baseline:  Goal status: INITIAL Target date:  06/14/2022  LONG TERM GOALS: Target  date: 07/08/2022  Pt will ambulate with improved TKE, increased Wb'ing thru L LE, and reduced antalgic limp.  Baseline:  Goal status: INITIAL  2.  Pt will report she is able to perform extended community ambulation without significant pain or difficulty.  Baseline:  Goal status: INITIAL  3.  Pt will be able to perform her normal work activities without significant pain or limitation. Baseline:  Goal status: INITIAL  4.  Pt will demo improved strength to 5/5 in L hip flexion, knee extension and 4+/5 in L hip abduction, extension, and knee flexion for improved performance of and tolerance with daily mobility and work activities.  Baseline:  Goal status: INITIAL     PLAN: PT FREQUENCY: 2x/week  PT DURATION: 6 weeks  PLANNED INTERVENTIONS: Therapeutic exercises, Therapeutic activity, Neuromuscular re-education, Balance training, Gait training, Patient/Family education, Joint mobilization, Stair training, Aquatic Therapy, Dry Needling, Electrical stimulation, Cryotherapy, Moist heat, Taping, Ultrasound, Manual therapy, and  Re-evaluation  PLAN FOR NEXT SESSION:  Cont with progressing L hip and knee strength.      Omere Marti U PT DPT PN2  06/18/2022, 11:44 AM

## 2022-06-22 ENCOUNTER — Other Ambulatory Visit (HOSPITAL_COMMUNITY): Payer: Self-pay

## 2022-06-22 ENCOUNTER — Ambulatory Visit
Admission: RE | Admit: 2022-06-22 | Discharge: 2022-06-22 | Disposition: A | Discharge status: Home / Self Care | Payer: 59 | Source: Ambulatory Visit | Attending: Emergency Medicine | Admitting: Emergency Medicine

## 2022-06-22 VITALS — BP 103/67 | HR 73 | Temp 98.4°F | Resp 18

## 2022-06-22 DIAGNOSIS — J311 Chronic nasopharyngitis: Secondary | ICD-10-CM | POA: Diagnosis not present

## 2022-06-22 DIAGNOSIS — R058 Other specified cough: Secondary | ICD-10-CM

## 2022-06-22 DIAGNOSIS — J309 Allergic rhinitis, unspecified: Secondary | ICD-10-CM | POA: Diagnosis not present

## 2022-06-22 DIAGNOSIS — J209 Acute bronchitis, unspecified: Secondary | ICD-10-CM

## 2022-06-22 MED ORDER — ALBUTEROL SULFATE HFA 108 (90 BASE) MCG/ACT IN AERS
2.0000 | INHALATION_SPRAY | Freq: Four times a day (QID) | RESPIRATORY_TRACT | 0 refills | Status: AC | PRN
  Filled 2022-06-22: qty 6.7, 25d supply, fill #0

## 2022-06-22 MED ORDER — PROMETHAZINE-DM 6.25-15 MG/5ML PO SYRP
5.0000 mL | ORAL_SOLUTION | Freq: Four times a day (QID) | ORAL | 0 refills | Status: DC | PRN
  Filled 2022-06-22: qty 180, 9d supply, fill #0

## 2022-06-22 MED ORDER — CEFDINIR 300 MG PO CAPS
300.0000 mg | ORAL_CAPSULE | Freq: Two times a day (BID) | ORAL | 0 refills | Status: AC
  Filled 2022-06-22: qty 20, 10d supply, fill #0

## 2022-06-22 MED ORDER — ALBUTEROL SULFATE (2.5 MG/3ML) 0.083% IN NEBU
2.5000 mg | INHALATION_SOLUTION | Freq: Once | RESPIRATORY_TRACT | Status: AC
  Administered 2022-06-22: 2.5 mg via RESPIRATORY_TRACT

## 2022-06-22 MED ORDER — FLUCONAZOLE 150 MG PO TABS
ORAL_TABLET | ORAL | 0 refills | Status: DC
  Filled 2022-06-22: qty 2, 3d supply, fill #0

## 2022-06-22 MED ORDER — METHYLPREDNISOLONE 4 MG PO TBPK
ORAL_TABLET | ORAL | 0 refills | Status: DC
Start: 2022-06-22 — End: 2022-08-28
  Filled 2022-06-22: qty 21, 6d supply, fill #0

## 2022-06-22 NOTE — ED Notes (Signed)
Rounding on patient   Interventions:   Patient given water.

## 2022-06-22 NOTE — ED Provider Notes (Signed)
[facility]    CSN: [id number] Arrival date & time: 06/22/22  5625    HISTORY   Chief Complaint  Patient presents with   Cough    Cough for 4 weeks. - Entered by patient   HPI Michelle Ray is a pleasant, 56 y.o. female who presents to urgent care today. Patient complains of a nonproductive cough that she has had for over a month.  Patient states she has been prescribed Tessalon Perles which she states were not helpful.  Patient states she takes allergy medications daily as well as Atrovent nasal spray for postnasal drip.  Patient denies fever, aches, chills, nausea, vomiting, diarrhea, sore throat, ear pain.  The history is provided by the patient.   Past Medical History:  Diagnosis Date   Allergy    Anemia    Aortic atherosclerosis (HCC)    Benign secondary hypertension due to renal artery stenosis (HCC)    Constipation    uses OTC stool softener prn only- this is not chronic   Diabetes mellitus without complication (HCC)    GERD (gastroesophageal reflux disease)    Hyperlipemia    Hyperlipidemia    Hypertension    Patient Active Problem List   Diagnosis Date Noted   RLS (restless legs syndrome) 03/06/2022   Fibromyalgia muscle pain 03/06/2022   Small right kidney 08/29/2021   Atrophy of right kidney 08/29/2021   Varicose veins of both legs with edema 02/17/2021   Stress incontinence in female 02/17/2021   Pain and swelling of right ankle 02/17/2021   Controlled type 2 diabetes mellitus with complication, without long-term current use of insulin ([place]) 02/17/2021   Hyperlipidemia associated with type 2 diabetes mellitus ([place]) 02/17/2021   Right upper quadrant abdominal pain 02/17/2021   BMI 40.0-44.9, adult ([place]) 02/17/2021   Aortic atherosclerosis ([place]) 02/17/2021   Chronic thumb pain, bilateral 02/17/2021   Hypertension associated with diabetes ([place]) 05/29/2016   Past Surgical History:  Procedure Laterality Date   ABLATION     ENDOVENOUS ABLATION  SAPHENOUS VEIN W/ LASER Right 01/28/2022   endovenous laser ablation right greater saphenous vein and stab phlebectomy 10-20 incisions right leg by Gae Gallop MD   TUBAL LIGATION     uterine fibroid removal     WISDOM TOOTH EXTRACTION     OB History   No obstetric history on file.    Home Medications    Prior to Admission medications   Medication Sig Start Date End Date Taking? Authorizing Provider  Bempedoic Acid 180 MG TABS Take 1 tablet (180 mg) by mouth daily. 03/10/22   Kate Sable, MD  benazepril (LOTENSIN) 20 MG tablet TAKE 1 TABLET BY MOUTH ONCE A DAY 03/23/22   Early, Coralee Pesa, NP  benzonatate (TESSALON) 100 MG capsule Take 1 capsule (100 mg total) by mouth 3 (three) times daily as needed. 06/03/22   Mar Daring, PA-C  carbidopa-levodopa (SINEMET) 25-250 MG tablet Take 1 tablet by mouth at bedtime. 02/27/22   Orma Render, NP  Continuous Blood Gluc Sensor (FREESTYLE LIBRE 2 SENSOR) MISC Attach one sensor to the arm every 14 days. 03/04/22   Orma Render, NP  DULoxetine (CYMBALTA) 20 MG capsule Take 1 tab ('20mg'$ ) by mouth for the first 7 days then increase to 2 tablets ('40mg'$ ) by mouth daily. 02/27/22   Orma Render, NP  esomeprazole (NEXIUM) 40 MG capsule TAKE 1 CAPSULE BY MOUTH ONCE A DAY 02/22/22   Early, Coralee Pesa, NP  ezetimibe (ZETIA) 10  MG tablet Take 1 tablet (10 mg total) by mouth daily. 03/10/22   Orma Render, NP  furosemide (LASIX) 40 MG tablet TAKE 1 TABLET BY MOUTH ONCE A DAY 05/05/22 05/05/23  Orma Render, NP  ipratropium (ATROVENT) 0.03 % nasal spray Place 2 sprays into both nostrils every 12 (twelve) hours. 06/03/22   Mar Daring, PA-C  meloxicam (MOBIC) 15 MG tablet Take 1 tablet by mouth daily. 03/27/22   Edrick Kins, DPM  potassium chloride SA (KLOR-CON M) 20 MEQ tablet Take 1 tablet by mouth twice a day for 1 week then once a day. Recheck in 4 weeks. 09/29/21   Orma Render, NP  tirzepatide [facility]) 15 MG/0.5ML Pen Inject 15 mg into the skin once  a week. 06/15/22   Orma Render, NP  triamterene-hydrochlorothiazide (MAXZIDE-25) 37.5-25 MG tablet TAKE 1 TABLET BY MOUTH ONCE A DAY 05/05/22 05/05/23  Orma Render, NP    Family History Family History  Problem Relation Age of Onset   Atrial fibrillation Mother    Pulmonary embolism Mother    Breast cancer Mother 70   Heart failure Sister    Hypertension Maternal Grandmother    Heart disease Maternal Grandfather    Stroke Paternal Grandfather    Colon cancer Neg Hx    Colon polyps Neg Hx    Social History Social History   Tobacco Use   Smoking status: Former   Smokeless tobacco: Never  Scientific laboratory technician Use: Never used  Substance Use Topics   Alcohol use: No   Drug use: No   Allergies   Crestor [rosuvastatin], Macrobid [nitrofurantoin], and Pravastatin  Review of Systems Review of Systems Pertinent findings revealed after performing a 14 point review of systems has been noted in the history of present illness.  Physical Exam Triage Vital Signs ED Triage Vitals  Enc Vitals Group     BP 09/26/21 0827 (!) 147/82     Pulse Rate 09/26/21 0827 72     Resp 09/26/21 0827 18     Temp 09/26/21 0827 98.3 F (36.8 C)     Temp Source 09/26/21 0827 Oral     SpO2 09/26/21 0827 98 %     Weight --      Height --      Head Circumference --      Peak Flow --      Pain Score 09/26/21 0826 5     Pain Loc --      Pain Edu? --      Excl. in [place]? --   No data found.  Updated Vital Signs BP 103/67 (BP Location: Left Arm)   Pulse 73   Temp 98.4 F (36.9 C) (Oral)   Resp 18   SpO2 97%   Physical Exam Vitals and nursing note reviewed.  Constitutional:      General: She is not in acute distress.    Appearance: Normal appearance. She is not ill-appearing.  HENT:     Head: Normocephalic and atraumatic.     Salivary Glands: Right salivary gland is not diffusely enlarged or tender. Left salivary gland is not diffusely enlarged or tender.     Right Ear: Ear canal and external  ear normal. No drainage. A middle ear effusion is present. There is no impacted cerumen. Tympanic membrane is bulging. Tympanic membrane is not injected or erythematous.     Left Ear: Ear canal and external ear normal. No drainage. A middle ear effusion  is present. There is no impacted cerumen. Tympanic membrane is bulging. Tympanic membrane is not injected or erythematous.     Ears:     Comments: Bilateral EACs normal, both TMs bulging with clear fluid    Nose: Rhinorrhea present. No nasal deformity, septal deviation, signs of injury, nasal tenderness, mucosal edema or congestion. Rhinorrhea is clear.     Right Nostril: Occlusion present. No foreign body, epistaxis or septal hematoma.     Left Nostril: Occlusion present. No foreign body, epistaxis or septal hematoma.     Right Turbinates: Enlarged, swollen and pale.     Left Turbinates: Enlarged, swollen and pale.     Right Sinus: No maxillary sinus tenderness or frontal sinus tenderness.     Left Sinus: No maxillary sinus tenderness or frontal sinus tenderness.     Mouth/Throat:     Lips: Pink. No lesions.     Mouth: Mucous membranes are moist. No oral lesions.     Pharynx: Oropharynx is clear. Uvula midline. No posterior oropharyngeal erythema or uvula swelling.     Tonsils: No tonsillar exudate. 0 on the right. 0 on the left.     Comments: Postnasal drip Eyes:     General: Lids are normal.        Right eye: No discharge.        Left eye: No discharge.     Extraocular Movements: Extraocular movements intact.     Conjunctiva/sclera: Conjunctivae normal.     Right eye: Right conjunctiva is not injected.     Left eye: Left conjunctiva is not injected.  Neck:     Trachea: Trachea and phonation normal.  Cardiovascular:     Rate and Rhythm: Normal rate and regular rhythm.     Pulses: Normal pulses.     Heart sounds: Normal heart sounds. No murmur heard.    No friction rub. No gallop.  Pulmonary:     Effort: Pulmonary effort is normal.  No accessory muscle usage, prolonged expiration or respiratory distress.     Breath sounds: Normal breath sounds. No stridor, decreased air movement or transmitted upper airway sounds. No decreased breath sounds, wheezing, rhonchi or rales.  Chest:     Chest wall: No tenderness.  Musculoskeletal:        General: Normal range of motion.     Cervical back: Normal range of motion and neck supple. Normal range of motion.  Lymphadenopathy:     Cervical: No cervical adenopathy.  Skin:    General: Skin is warm and dry.     Findings: No erythema or rash.  Neurological:     General: No focal deficit present.     Mental Status: She is alert and oriented to person, place, and time.  Psychiatric:        Mood and Affect: Mood normal.        Behavior: Behavior normal.     Visual Acuity Right Eye Distance:   Left Eye Distance:   Bilateral Distance:    Right Eye Near:   Left Eye Near:    Bilateral Near:     UC Couse / Diagnostics / Procedures:     Radiology No results found.  Procedures Procedures (including critical care time) EKG  Pending results:  Labs Reviewed - No data to display  Medications Ordered in UC: Medications  albuterol (PROVENTIL) (2.5 MG/3ML) 0.083% nebulizer solution 2.5 mg (2.5 mg Nebulization Given 06/22/22 0911)    UC Diagnoses / Final Clinical Impressions(s)   I have  reviewed the triage vital signs and the nursing notes.  Pertinent labs & imaging results that were available during my care of the patient were reviewed by me and considered in my medical decision making (see chart for details).    Final diagnoses:  Allergic rhinitis, unspecified seasonality, unspecified trigger  Post-nasal catarrh  Non-productive cough  Acute bronchitis, unspecified organism   Patient provided with a prescription for cefdinir for likely bacterial respiratory infection due to prolonged symptoms.  Patient provided with methylprednisolone for wheezing and chest that did not  respond well to nebulized albuterol treatment during her visit today.  Patient also advised to use albuterol as well, HFA prescribed.  Patient provided with a prescription for fluconazole as for the inevitable vaginal yeast infection that developed secondary to using 10 days of antibiotics.  Patient provided with Promethazine DM for nighttime cough to help her sleep.  Return precautions advised.  ED Prescriptions     Medication Sig Dispense Auth. Provider   promethazine-dextromethorphan (PROMETHAZINE-DM) 6.25-15 MG/5ML syrup Take 5 mLs by mouth 4 (four) times daily as needed for cough. 180 mL Lynden Oxford Scales, PA-C   cefdinir (OMNICEF) 300 MG capsule Take 1 capsule (300 mg total) by mouth 2 (two) times daily for 10 days. 20 capsule Lynden Oxford Scales, PA-C   fluconazole (DIFLUCAN) 150 MG tablet Take 1 tablet on day 4 of antibiotics.  Take second tablet 3 days later. 2 tablet Lynden Oxford Scales, PA-C   methylPREDNISolone (MEDROL DOSEPAK) 4 MG TBPK tablet Take 6 tablets by mouth once on day 1. Decrease by 1 tablet daily until gone (6,5,4,3,2,1). Do not space tablets out throughout the day 21 tablet Lynden Oxford Scales, PA-C   albuterol (VENTOLIN HFA) 108 (90 Base) MCG/ACT inhaler Inhale 2 puffs into the lungs every 6 (six) hours as needed for wheezing or shortness of breath (Cough). 6.7 g Lynden Oxford Scales, PA-C      PDMP not reviewed this encounter.  Disposition Upon Discharge:  Condition: stable for discharge home Home: take medications as prescribed; routine discharge instructions as discussed; follow up as advised.  Patient presented with an acute illness with associated systemic symptoms and significant discomfort requiring urgent management. In my opinion, this is a condition that a prudent lay person (someone who possesses an average knowledge of health and medicine) [place] potentially expect to result in complications if not addressed urgently such as respiratory distress,  impairment of bodily function or dysfunction of bodily organs.   Routine symptom specific, illness specific and/or disease specific instructions were discussed with the patient and/or caregiver at length.   As such, the patient has been evaluated and assessed, work-up was performed and treatment was provided in alignment with urgent care protocols and evidence based medicine.  Patient/parent/caregiver has been advised that the patient [place] require follow up for further testing and treatment if the symptoms continue in spite of treatment, as clinically indicated and appropriate.  If the patient was tested for COVID-19, Influenza and/or RSV, then the patient/parent/guardian was advised to isolate at home pending the results of his/her diagnostic coronavirus test and potentially longer if they're positive. I have also advised pt that if his/her COVID-19 test returns positive, it's recommended to self-isolate for at least 10 days after symptoms first appeared AND until fever-free for 24 hours without fever reducer AND other symptoms have improved or resolved. Discussed self-isolation recommendations as well as instructions for household member/close contacts as per the CDC and [place] DHHS, and also gave patient the COVID packet  with this information.  Patient/parent/caregiver has been advised to return to the [facility] or PCP in 3-5 days if no better; to PCP or the Emergency Department if new signs and symptoms develop, or if the current signs or symptoms continue to change or worsen for further workup, evaluation and treatment as clinically indicated and appropriate  The patient will follow up with their current PCP if and as advised. If the patient does not currently have a PCP we will assist them in obtaining one.   The patient [place] need specialty follow up if the symptoms continue, in spite of conservative treatment and management, for further workup, evaluation, consultation and treatment as clinically indicated  and appropriate.  Patient/parent/caregiver verbalized understanding and agreement of plan as discussed.  All questions were addressed during visit.  Please see discharge instructions below for further details of plan.  Discharge Instructions:   Discharge Instructions      Your symptoms and physical exam findings are concerning for a viral respiratory infection likely secondary to allergies that have just gotten ahead of you and has morphed into bronchitis.     Please see the list below for recommended medications, dosages and frequencies to provide relief of your current symptoms:    Omnicef (cefdinir): To treat the possible infection brewing in both of your ears and likely complicating your bronchitis, please take 1 capsule twice daily for 10 days, you can take it with or without food.  This antibiotic can cause upset stomach, this will resolve once antibiotics are complete.  You are welcome to use a probiotic, eat yogurt, take Imodium while taking this medication.  Please avoid other systemic medications such as Maalox, Pepto-Bismol or milk of magnesia as they can interfere with your body's ability to absorb the antibiotics.   It is very important that you take antibiotics as prescribed.  If you skip doses or do not complete the full course of antibiotics, you put yourself at significant risk of recurrent infection which can often be worse than your initial infection.  Because we know that antibiotic treatment can often cause vaginal yeast infections, I have also provided you with a prescription for fluconazole (Diflucan).  Please take the first tablet on the third day of your antibiotics and take the second tablet two days after the first tablet.  You do not need to pick up the prescription if you feel it is not needed, it has been provided as a courtesy.   Medrol Dosepak (methylprednisolone): This is a steroid that will significantly calm your upper and lower airways, please take one full row  of tablets daily with your breakfast meal starting tomorrow morning until the prescription is complete or until you feel you no longer need it.      ProAir, Ventolin, Proventil (albuterol): This inhaled medication contains a short acting beta agonist bronchodilator.  This medication works on the smooth muscle that opens and constricts of your airways by relaxing the muscle.  The result of relaxation of the smooth muscle is increased air movement and improved work of breathing.  This is a short acting medication that can be used every 4-6 hours as needed for increased work of breathing, shortness of breath, wheezing and excessive coughing.  I have provided you with a prescription.    Promethazine DM: Promethazine is both a nasal decongestant and an antinausea medication that makes most patients feel fairly sleepy.  The DM is dextromethorphan, a cough suppressant found in many over-the-counter cough medications.  Please take 5  mL before bedtime to minimize your cough which will help you sleep better.  I have sent a prescription for this medication to your pharmacy.   Allegra (fexofenadine): This is an excellent second-generation antihistamine that helps to reduce respiratory inflammatory response to environmental allergens.  This medication is not known to cause daytime sleepiness so it can be taken in the daytime.  Please continue taking this daily.   Flonase (fluticasone): This is a steroid nasal spray that I recommend you continue to use once daily, 1 spray in each nare.  This medication does not work well if you decide to use it only used as you feel you need to, it works best used on a daily basis.  After 3 to 5 days of use, you will notice significant reduction of the inflammation and mucus production that is currently being caused by exposure to allergens, whether seasonal or environmental.  The most common side effect of this medication is nosebleeds.  If you experience a nosebleed, please discontinue  use for 1 week, then feel free to resume.  I have provided you with a prescription.     If you find that you have not had improvement of your symptoms in the next 3 to 5 days, please follow-up with your primary care provider or return here to urgent care for repeat evaluation and further recommendations.   Thank you for visiting urgent care today.  We appreciate the opportunity to participate in your care.       This office note has been dictated using Museum/gallery curator.  Unfortunately, this method of dictation can sometimes lead to typographical or grammatical errors.  I apologize for your inconvenience in advance if this occurs.  Please do not hesitate to reach out to me if clarification is needed.      Lynden Oxford Scales, PA-C 06/23/22 1426

## 2022-06-22 NOTE — Discharge Instructions (Addendum)
Your symptoms and physical exam findings are concerning for a viral respiratory infection likely secondary to allergies that have just gotten ahead of you and has morphed into bronchitis.     Please see the list below for recommended medications, dosages and frequencies to provide relief of your current symptoms:    Omnicef (cefdinir): To treat the possible infection brewing in both of your ears and likely complicating your bronchitis, please take 1 capsule twice daily for 10 days, you can take it with or without food.  This antibiotic can cause upset stomach, this will resolve once antibiotics are complete.  You are welcome to use a probiotic, eat yogurt, take Imodium while taking this medication.  Please avoid other systemic medications such as Maalox, Pepto-Bismol or milk of magnesia as they can interfere with your body's ability to absorb the antibiotics.   It is very important that you take antibiotics as prescribed.  If you skip doses or do not complete the full course of antibiotics, you put yourself at significant risk of recurrent infection which can often be worse than your initial infection.  Because we know that antibiotic treatment can often cause vaginal yeast infections, I have also provided you with a prescription for fluconazole (Diflucan).  Please take the first tablet on the third day of your antibiotics and take the second tablet two days after the first tablet.  You do not need to pick up the prescription if you feel it is not needed, it has been provided as a courtesy.   Medrol Dosepak (methylprednisolone): This is a steroid that will significantly calm your upper and lower airways, please take one full row of tablets daily with your breakfast meal starting tomorrow morning until the prescription is complete or until you feel you no longer need it.      ProAir, Ventolin, Proventil (albuterol): This inhaled medication contains a short acting beta agonist bronchodilator.  This  medication works on the smooth muscle that opens and constricts of your airways by relaxing the muscle.  The result of relaxation of the smooth muscle is increased air movement and improved work of breathing.  This is a short acting medication that can be used every 4-6 hours as needed for increased work of breathing, shortness of breath, wheezing and excessive coughing.  I have provided you with a prescription.    Promethazine DM: Promethazine is both a nasal decongestant and an antinausea medication that makes most patients feel fairly sleepy.  The DM is dextromethorphan, a cough suppressant found in many over-the-counter cough medications.  Please take 5 mL before bedtime to minimize your cough which will help you sleep better.  I have sent a prescription for this medication to your pharmacy.   Allegra (fexofenadine): This is an excellent second-generation antihistamine that helps to reduce respiratory inflammatory response to environmental allergens.  This medication is not known to cause daytime sleepiness so it can be taken in the daytime.  Please continue taking this daily.   Flonase (fluticasone): This is a steroid nasal spray that I recommend you continue to use once daily, 1 spray in each nare.  This medication does not work well if you decide to use it only used as you feel you need to, it works best used on a daily basis.  After 3 to 5 days of use, you will notice significant reduction of the inflammation and mucus production that is currently being caused by exposure to allergens, whether seasonal or environmental.  The most common side  effect of this medication is nosebleeds.  If you experience a nosebleed, please discontinue use for 1 week, then feel free to resume.  I have provided you with a prescription.     If you find that you have not had improvement of your symptoms in the next 3 to 5 days, please follow-up with your primary care provider or return here to urgent care for repeat  evaluation and further recommendations.   Thank you for visiting urgent care today.  We appreciate the opportunity to participate in your care.

## 2022-06-22 NOTE — ED Triage Notes (Signed)
The patient c/o cough for a month.  Home interventions: Tessalon pearls

## 2022-06-24 ENCOUNTER — Encounter (HOSPITAL_BASED_OUTPATIENT_CLINIC_OR_DEPARTMENT_OTHER): Payer: 59 | Admitting: Physical Therapy

## 2022-06-25 ENCOUNTER — Other Ambulatory Visit (HOSPITAL_BASED_OUTPATIENT_CLINIC_OR_DEPARTMENT_OTHER): Payer: Self-pay | Admitting: Nurse Practitioner

## 2022-06-25 ENCOUNTER — Other Ambulatory Visit (HOSPITAL_COMMUNITY): Payer: Self-pay

## 2022-06-25 MED ORDER — EZETIMIBE 10 MG PO TABS
10.0000 mg | ORAL_TABLET | Freq: Every day | ORAL | 0 refills | Status: DC
  Filled 2022-06-25: qty 90, 90d supply, fill #0

## 2022-06-26 ENCOUNTER — Other Ambulatory Visit (HOSPITAL_COMMUNITY): Payer: Self-pay

## 2022-06-26 ENCOUNTER — Encounter (HOSPITAL_BASED_OUTPATIENT_CLINIC_OR_DEPARTMENT_OTHER): Payer: 59 | Admitting: Physical Therapy

## 2022-07-02 ENCOUNTER — Encounter (HOSPITAL_BASED_OUTPATIENT_CLINIC_OR_DEPARTMENT_OTHER): Payer: 59 | Admitting: Physical Therapy

## 2022-07-03 NOTE — Therapy (Signed)
OUTPATIENT PHYSICAL THERAPY LOWER EXTREMITY TREATMENT    Patient Name: Michelle Ray MRN: [id number] DOB:Feb 22, 1966, 56 y.o., female Today's Date: 07/06/2022   PT End of Session - 07/06/22 1121     Visit Number 5    Number of Visits 12    Date for PT Re-Evaluation 07/08/22    Authorization Type [facility] [employer]    PT Start Time 1112    PT Stop Time 1152    PT Time Calculation (min) 40 min    Activity Tolerance Patient tolerated treatment well    Behavior During Therapy WFL for tasks assessed/performed                 Past Medical History:  Diagnosis Date   Allergy    Anemia    Aortic atherosclerosis ([place])    Benign secondary hypertension due to renal artery stenosis (HCC)    Constipation    uses OTC stool softener prn only- this is not chronic   Diabetes mellitus without complication (HCC)    GERD (gastroesophageal reflux disease)    Hyperlipemia    Hyperlipidemia    Hypertension    Past Surgical History:  Procedure Laterality Date   ABLATION     ENDOVENOUS ABLATION SAPHENOUS VEIN W/ LASER Right 01/28/2022   endovenous laser ablation right greater saphenous vein and stab phlebectomy 10-20 incisions right leg by Gae Gallop MD   TUBAL LIGATION     uterine fibroid removal     WISDOM TOOTH EXTRACTION     Patient Active Problem List   Diagnosis Date Noted   RLS (restless legs syndrome) 03/06/2022   Fibromyalgia muscle pain 03/06/2022   Small right kidney 08/29/2021   Atrophy of right kidney 08/29/2021   Varicose veins of both legs with edema 02/17/2021   Stress incontinence in female 02/17/2021   Pain and swelling of right ankle 02/17/2021   Controlled type 2 diabetes mellitus with complication, without long-term current use of insulin ([place]) 02/17/2021   Hyperlipidemia associated with type 2 diabetes mellitus ([place]) 02/17/2021   Right upper quadrant abdominal pain 02/17/2021   BMI 40.0-44.9, adult ([place]) 02/17/2021   Aortic atherosclerosis ([place]) 02/17/2021    Chronic thumb pain, bilateral 02/17/2021   Hypertension associated with diabetes ([place]) 05/29/2016    PCP: Jacolyn Reedy, NP  REFERRING PROVIDER: Vanetta Mulders, MD    REFERRING DIAG: M17.12 (ICD-10-CM) - Unilateral primary osteoarthritis, left knee   THERAPY DIAG:  Left knee pain, unspecified chronicity  Muscle weakness (generalized)  Difficulty in walking, not elsewhere classified  Rationale for Evaluation and Treatment Rehabilitation  ONSET DATE: Exacerbation January 2023  SUBJECTIVE:    SUBJECTIVE STATEMENT: Pt states her knee is feeling better.  She denies any adverse effects after prior  Rx.  Pt states she does her exercises and her quad pain feels better.  Pt reports compliance with HEP.    PERTINENT HISTORY: DM, bilat foot pain (has bone spurs per pt)  PAIN:  Are you having pain? Yes NPRS:  Current:  .5/10, Best:  1/10, Worst:  7-8/10 Type:  dull ache, can be sharp.  Constant pain Location:  L knee, more medial  PRECAUTIONS: Other: per dx  WEIGHT BEARING RESTRICTIONS No  FALLS:  Has patient fallen in last 6 months? 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  Pt is on her feet most of the day 2/3 days per week.    PLOF: Independent; pt was able to perform all of her daily activities, work activities, and functional mobility with less pain.    PATIENT GOALS reduce pain, improved walking with less pain      OBJECTIVE:   DIAGNOSTIC FINDINGS:  Xray (per MD note): There is mild medial joint space narrowing consistent with early osteoarthritis X ray (per Epic): IMPRESSION: 1. No acute fracture or dislocation. 2. Small joint effusion. 3. Mild degenerative changes of the medial compartment.    TODAY'S TREATMENT 06/18/22  Nustep L5x5 minutes BLEs only  LAQ with 3#  x10, 5# 2x10 Seated HS curl with GTB 3x10 Supine bridge with clams with BTB 2x10   Sidelying clams 2x10 B  blue TB  Mini squat holds in front of mat table 2x10  3 way hip 1x10 B green TB  Lateral stepping with green TB above knees x 1 lap monster walks with green TB above knees x 1 lap Forward and lateral step ups on 4 inch box x12  Marching on airex without using Ue's 2x10 reps       PATIENT EDUCATION:  Education details:  HEP, POC, exercise form, and rationale of exercises.  Person educated: Patient Education method: Explanation, Demonstration, Tactile cues, Verbal cues Education comprehension: verbalized understanding, returned demonstration, verbal cues required, tactile cues required, and needs further education   HOME EXERCISE PROGRAM: Access Code: [email or web address] URL: [email or web address] Date: 05/27/2022 Prepared by: Ronny Flurry  Exercises - Supine Quadricep Sets  - 2 x daily - 7 x weekly - 2 sets - 10 reps - 5 seconds hold - Supine Active Straight Leg Raise  - 1 x daily - 5-6 x weekly - 2 sets - 10 reps - Supine Bridge  - 1 x daily - 5-6 x weekly - 2 sets - 10 reps - Hooklying Clamshell with Resistance  - 1 x daily - 4-5 x weekly - 2 sets - 10 reps - Supine Heel Slide  - 2 x daily - 7 x weekly - 2 sets - 10 reps  ASSESSMENT:  CLINICAL IMPRESSION: Pt reports improved sx's including improved pain with HEP.  Pt is improving with L LE strength as evidenced by performance and progression of exercises.  She responded well to Rx having no c/o's and no increased pain after Rx.  She should benefit from cont skilled PT services to address ongoing goals and to restore desired level of function.     OBJECTIVE IMPAIRMENTS Abnormal gait, decreased activity tolerance, decreased endurance, decreased mobility, difficulty walking, decreased ROM, decreased strength, hypomobility, and pain.   ACTIVITY LIMITATIONS standing, squatting, stairs, transfers, and locomotion  level  PARTICIPATION LIMITATIONS: occupation  [place] 1 comorbidity: DM  are also affecting patient's functional outcome.   REHAB POTENTIAL: Good  CLINICAL DECISION MAKING: Stable/uncomplicated  EVALUATION COMPLEXITY: Low   GOALS:  SHORT TERM GOALS: Target date: 06/17/2022 Pt will be independent and compliant with HEP for improved pain, strength, and function.  Baseline: Goal status: INITIAL  2.  Pt will report improved stability with reduced buckling with daily mobility.  Baseline:  Goal status: INITIAL  3.  Pt will report at least a 25% improvement in pain and sx's overall.  Baseline:  Goal status: INITIAL  4.  Pt will report she is able to perform transfers without significant pain.  Baseline:  Goal status: INITIAL Target date:  06/14/2022  LONG TERM GOALS: Target date: 07/08/2022  Pt will ambulate with improved TKE, increased Wb'ing thru L LE, and reduced antalgic limp.  Baseline:  Goal status: INITIAL  2.  Pt will report she is able to perform extended community ambulation without significant pain or difficulty.  Baseline:  Goal status: INITIAL  3.  Pt will be able to perform her normal work activities without significant pain or limitation. Baseline:  Goal status: INITIAL  4.  Pt will demo improved strength to 5/5 in L hip flexion, knee extension and 4+/5 in L hip abduction, extension, and knee flexion for improved performance of and tolerance with daily mobility and work activities.  Baseline:  Goal status: INITIAL     PLAN: PT FREQUENCY: 2x/week  PT DURATION: 6 weeks  PLANNED INTERVENTIONS: Therapeutic exercises, Therapeutic activity, Neuromuscular re-education, Balance training, Gait training, Patient/Family education, Joint mobilization, Stair training, Aquatic Therapy, Dry Needling, Electrical stimulation, Cryotherapy, Moist heat, Taping, Ultrasound, Manual therapy, and Re-evaluation  PLAN FOR NEXT SESSION:  Cont with progressing L hip and  knee strength.     Selinda Michaels III PT, DPT 07/06/22 4:00 PM

## 2022-07-06 ENCOUNTER — Ambulatory Visit (HOSPITAL_BASED_OUTPATIENT_CLINIC_OR_DEPARTMENT_OTHER): Payer: 59 | Attending: Orthopaedic Surgery | Admitting: Physical Therapy

## 2022-07-06 ENCOUNTER — Encounter (HOSPITAL_BASED_OUTPATIENT_CLINIC_OR_DEPARTMENT_OTHER): Payer: Self-pay | Admitting: Physical Therapy

## 2022-07-06 DIAGNOSIS — R262 Difficulty in walking, not elsewhere classified: Secondary | ICD-10-CM | POA: Diagnosis not present

## 2022-07-06 DIAGNOSIS — M6281 Muscle weakness (generalized): Secondary | ICD-10-CM | POA: Diagnosis not present

## 2022-07-06 DIAGNOSIS — M25562 Pain in left knee: Secondary | ICD-10-CM | POA: Diagnosis not present

## 2022-07-08 ENCOUNTER — Ambulatory Visit (HOSPITAL_BASED_OUTPATIENT_CLINIC_OR_DEPARTMENT_OTHER): Payer: 59 | Admitting: Physical Therapy

## 2022-07-08 ENCOUNTER — Encounter (HOSPITAL_BASED_OUTPATIENT_CLINIC_OR_DEPARTMENT_OTHER): Payer: Self-pay | Admitting: Physical Therapy

## 2022-07-08 DIAGNOSIS — M25562 Pain in left knee: Secondary | ICD-10-CM | POA: Diagnosis not present

## 2022-07-08 DIAGNOSIS — M6281 Muscle weakness (generalized): Secondary | ICD-10-CM | POA: Diagnosis not present

## 2022-07-08 DIAGNOSIS — R262 Difficulty in walking, not elsewhere classified: Secondary | ICD-10-CM

## 2022-07-08 NOTE — Therapy (Addendum)
OUTPATIENT PHYSICAL THERAPY LOWER EXTREMITY TREATMENT    Patient Name: Michelle Ray MRN: [id number] DOB:11-04-1966, 56 y.o., female Today's Date: 07/08/2022   PT End of Session - 07/08/22 1124     Visit Number 6    Number of Visits 12    Date for PT Re-Evaluation 08/12/22    Authorization Type [place] [employer]    PT Start Time 1102    PT Stop Time 1141    PT Time Calculation (min) 39 min    Activity Tolerance Patient tolerated treatment well    Behavior During Therapy WFL for tasks assessed/performed                  Past Medical History:  Diagnosis Date   Allergy    Anemia    Aortic atherosclerosis ([place])    Benign secondary hypertension due to renal artery stenosis (HCC)    Constipation    uses OTC stool softener prn only- this is not chronic   Diabetes mellitus without complication (HCC)    GERD (gastroesophageal reflux disease)    Hyperlipemia    Hyperlipidemia    Hypertension    Past Surgical History:  Procedure Laterality Date   ABLATION     ENDOVENOUS ABLATION SAPHENOUS VEIN W/ LASER Right 01/28/2022   endovenous laser ablation right greater saphenous vein and stab phlebectomy 10-20 incisions right leg by Gae Gallop MD   TUBAL LIGATION     uterine fibroid removal     WISDOM TOOTH EXTRACTION     Patient Active Problem List   Diagnosis Date Noted   RLS (restless legs syndrome) 03/06/2022   Fibromyalgia muscle pain 03/06/2022   Small right kidney 08/29/2021   Atrophy of right kidney 08/29/2021   Varicose veins of both legs with edema 02/17/2021   Stress incontinence in female 02/17/2021   Pain and swelling of right ankle 02/17/2021   Controlled type 2 diabetes mellitus with complication, without long-term current use of insulin ([place]) 02/17/2021   Hyperlipidemia associated with type 2 diabetes mellitus ([place]) 02/17/2021   Right upper quadrant abdominal pain 02/17/2021   BMI 40.0-44.9, adult ([place]) 02/17/2021   Aortic atherosclerosis ([place]) 02/17/2021    Chronic thumb pain, bilateral 02/17/2021   Hypertension associated with diabetes ([place]) 05/29/2016    PCP: Jacolyn Reedy, NP  REFERRING PROVIDER: Vanetta Mulders, MD    REFERRING DIAG: M17.12 (ICD-10-CM) - Unilateral primary osteoarthritis, left knee   THERAPY DIAG:  Left knee pain, unspecified chronicity  Muscle weakness (generalized)  Difficulty in walking, not elsewhere classified  Rationale for Evaluation and Treatment Rehabilitation  ONSET DATE: Exacerbation January 2023  SUBJECTIVE:    SUBJECTIVE STATEMENT:  I'm doing OK, did a bit much this morning as I was moving some furniture but overall it is better. Walking and steps are easier in general. Its easier for me to go down steps, harder for me to go up steps now.    PERTINENT HISTORY: DM, bilat foot pain (has bone spurs per pt)  PAIN:  Are you having pain? Yes NPRS:  Current:  .1/10, Best:  1/10, Worst:  4-5/10 Type:  dull ache, can be sharp.  Constant pain Location:  L knee, more medial  PRECAUTIONS: Other: per dx  WEIGHT BEARING RESTRICTIONS No  FALLS:  Has patient fallen in last 6 months? 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  Pt is on her feet most of the day 2/3 days per week.    PLOF: Independent; pt was able to perform all of her daily activities, work activities, and functional mobility with less pain.    PATIENT GOALS reduce pain, improved walking with less pain      OBJECTIVE:   DIAGNOSTIC FINDINGS:  Xray (per MD note): There is mild medial joint space narrowing consistent with early osteoarthritis X ray (per Epic): IMPRESSION: 1. No acute fracture or dislocation. 2. Small joint effusion. 3. Mild degenerative changes of the medial compartment.  TODAY'S TREATMENT 07/08/22  FOTO  56.7  Re-assessment and appropriate education completed  Nustep L5 x6 minutes BLEs only   Walking bridges 2x10  Prone hip extensions 1x15 B STS from standard chair 2x10 with 4 inch box  Hip hikes 1x10 B with swing   LOWER EXTREMITY ROM:   Active ROM LEFT eval RIGHT eval 8/9 L LE  8/9 R LE   Hip flexion        Hip extension [facility]    Hip abduction [facility]    Hip adduction        Hip internal rotation        Hip external rotation        Knee flexion 116 "uncomfortable" 115 110 DNT   Knee extension 0 "feels uncomfortable" 0 0 DNT  Ankle dorsiflexion        Ankle plantarflexion        Ankle inversion        Ankle eversion         (Blank rows = not tested)   LOWER EXTREMITY MMT:   MMT Right eval Left eval 8/9 R LE  8/9 L LE   Hip flexion 5/5 4/5 4/5 3/5  Hip extension 4/5 4/5 4/5 4/5  Hip abduction 4+/5 3+/5 4+/5 4-/5  Hip adduction        Hip internal rotation        Hip external rotation 5/5 4+/5    Knee flexion 4+/5 Unable to tolerate resistance 4+/5 4+/5  Knee extension 5/5 4-/5 5/5 4+/5  Ankle dorsiflexion        Ankle plantarflexion        Ankle inversion        Ankle eversion         (Blank rows = not tested)              PATIENT EDUCATION:  Education details:  HEP, POC, exercise form, and rationale of exercises.  Person educated: Patient Education method: Explanation, Demonstration, Tactile cues, Verbal cues Education comprehension: verbalized understanding, returned demonstration, verbal cues required, tactile cues required, and needs further education   HOME EXERCISE PROGRAM: Access Code: [email or web address] URL: [email or web address] Date: 05/27/2022 Prepared by: Ronny Flurry  Exercises - Supine Quadricep Sets  - 2 x daily - 7 x weekly - 2 sets - 10 reps - 5 seconds hold - Supine Active Straight Leg Raise  - 1 x daily - 5-6 x weekly - 2 sets - 10 reps - Supine Bridge  - 1 x daily - 5-6 x weekly - 2 sets - 10 reps - Hooklying  Clamshell with Resistance  - 1 x daily - 4-5 x weekly - 2 sets - 10 reps - Supine Heel Slide  - 2 x daily - 7 x weekly - 2 sets - 10 reps  ASSESSMENT:  CLINICAL IMPRESSION:  Reassessment completed  today- Jannice is showing good progress towards all STG and LTGs at this time, all symptoms are improving however she does continue to demonstrate impaired functional muscle strength, impaired functional activity tolerance and biomechanics, and ongoing knee pain. Will benefit from continuation of skilled PT services in order to address remaining factors and optimize overall level of function moving forward.     OBJECTIVE IMPAIRMENTS Abnormal gait, decreased activity tolerance, decreased endurance, decreased mobility, difficulty walking, decreased ROM, decreased strength, hypomobility, and pain.   ACTIVITY LIMITATIONS standing, squatting, stairs, transfers, and locomotion level  PARTICIPATION LIMITATIONS: occupation  [place] 1 comorbidity: DM  are also affecting patient's functional outcome.   REHAB POTENTIAL: Good  CLINICAL DECISION MAKING: Stable/uncomplicated  EVALUATION COMPLEXITY: Low   GOALS:  SHORT TERM GOALS: Target date: 06/17/2022 Pt will be independent and compliant with HEP for improved pain, strength, and function.  Baseline: 8/9 3-4 times per week not daily yet  Goal status: IN PROGRESS  2.  Pt will report improved stability with reduced buckling with daily mobility.  Baseline: 8/9- last buckle was about 3 weeks ago  Goal status: MET  3.  Pt will report at least a 25% improvement in pain and sx's overall.  Baseline:  Goal status: MET  4.  Pt will report she is able to perform transfers without significant pain.  Baseline: 8/9- currently 3-4/10 Goal status: MET Target date:  06/14/2022  LONG TERM GOALS: Target date: 07/08/2022  Pt will ambulate with improved TKE, increased Wb'ing thru L LE, and reduced antalgic limp.  Baseline: 8/9- still offloading from L LE  in general with gait  Goal status: IN PROGRESS  2.  Pt will report she is able to perform extended community ambulation without significant pain or difficulty.  Baseline: 8/9- can still increase to 4-5/10 Goal status: IN PROGRESS  3.  Pt will be able to perform her normal work activities without significant pain or limitation. Baseline: 8/9 improving from what it was but still an issue  Goal status: IN PROGRESS  4.  Pt will demo improved strength to 5/5 in L hip flexion, knee extension and 4+/5 in L hip abduction, extension, and knee flexion for improved performance of and tolerance with daily mobility and work activities.  Baseline: 8/9- improving but not at goal level  Goal status: IN PROGRESS     PLAN: PT FREQUENCY: 2x/week  PT DURATION: 6 weeks  PLANNED INTERVENTIONS: Therapeutic exercises, Therapeutic activity, Neuromuscular re-education, Balance training, Gait training, Patient/Family education, Joint mobilization, Stair training, Aquatic Therapy, Dry Needling, Electrical stimulation, Cryotherapy, Moist heat, Taping, Ultrasound, Manual therapy, and Re-evaluation  PLAN FOR NEXT SESSION:  Cont with progressing L hip and knee strength.     Ann Lions PT DPT PN2  07/08/2022, 11:43 AM  PHYSICAL THERAPY DISCHARGE SUMMARY  Visits from Start of Care: 6  Current functional level related to goals / functional outcomes: See above for reassessment and goal progress.   Remaining deficits: See above   Education / Equipment:    Patient was seen in PT from 05/27/22 - 07/08/22.  A reassessment was completed on the last visit.  She cancelled her following appointments.  PT has not heard back from Pt and she will be considered discharged at this time.   Selinda Michaels III PT, DPT 01/15/23 9:14 AM

## 2022-07-10 ENCOUNTER — Telehealth: Payer: Self-pay | Admitting: Cardiology

## 2022-07-10 NOTE — Telephone Encounter (Signed)
Have made several attempts to schedule with no success Deleted recall 

## 2022-07-22 ENCOUNTER — Ambulatory Visit (HOSPITAL_BASED_OUTPATIENT_CLINIC_OR_DEPARTMENT_OTHER): Payer: 59 | Admitting: Orthopaedic Surgery

## 2022-07-24 ENCOUNTER — Ambulatory Visit (HOSPITAL_BASED_OUTPATIENT_CLINIC_OR_DEPARTMENT_OTHER): Payer: 59 | Admitting: Physical Therapy

## 2022-07-28 ENCOUNTER — Other Ambulatory Visit (HOSPITAL_COMMUNITY): Payer: Self-pay

## 2022-07-29 ENCOUNTER — Encounter (HOSPITAL_BASED_OUTPATIENT_CLINIC_OR_DEPARTMENT_OTHER): Payer: 59 | Admitting: Physical Therapy

## 2022-07-31 ENCOUNTER — Encounter (HOSPITAL_BASED_OUTPATIENT_CLINIC_OR_DEPARTMENT_OTHER): Payer: 59 | Admitting: Physical Therapy

## 2022-08-07 ENCOUNTER — Encounter (HOSPITAL_BASED_OUTPATIENT_CLINIC_OR_DEPARTMENT_OTHER): Payer: 59 | Admitting: Physical Therapy

## 2022-08-13 ENCOUNTER — Ambulatory Visit (HOSPITAL_BASED_OUTPATIENT_CLINIC_OR_DEPARTMENT_OTHER): Payer: 59 | Admitting: Orthopaedic Surgery

## 2022-08-28 ENCOUNTER — Other Ambulatory Visit (HOSPITAL_COMMUNITY): Payer: Self-pay

## 2022-08-28 ENCOUNTER — Encounter (HOSPITAL_BASED_OUTPATIENT_CLINIC_OR_DEPARTMENT_OTHER): Payer: Self-pay | Admitting: Nurse Practitioner

## 2022-08-28 ENCOUNTER — Ambulatory Visit (HOSPITAL_BASED_OUTPATIENT_CLINIC_OR_DEPARTMENT_OTHER): Payer: 59 | Admitting: Nurse Practitioner

## 2022-08-28 VITALS — BP 122/73 | HR 73 | Ht 63.0 in | Wt 223.8 lb

## 2022-08-28 DIAGNOSIS — Z6841 Body Mass Index (BMI) 40.0 and over, adult: Secondary | ICD-10-CM

## 2022-08-28 DIAGNOSIS — R7989 Other specified abnormal findings of blood chemistry: Secondary | ICD-10-CM

## 2022-08-28 DIAGNOSIS — K219 Gastro-esophageal reflux disease without esophagitis: Secondary | ICD-10-CM | POA: Diagnosis not present

## 2022-08-28 DIAGNOSIS — E1169 Type 2 diabetes mellitus with other specified complication: Secondary | ICD-10-CM

## 2022-08-28 DIAGNOSIS — E118 Type 2 diabetes mellitus with unspecified complications: Secondary | ICD-10-CM

## 2022-08-28 DIAGNOSIS — R600 Localized edema: Secondary | ICD-10-CM | POA: Diagnosis not present

## 2022-08-28 DIAGNOSIS — E1159 Type 2 diabetes mellitus with other circulatory complications: Secondary | ICD-10-CM | POA: Diagnosis not present

## 2022-08-28 DIAGNOSIS — R944 Abnormal results of kidney function studies: Secondary | ICD-10-CM | POA: Diagnosis not present

## 2022-08-28 DIAGNOSIS — E785 Hyperlipidemia, unspecified: Secondary | ICD-10-CM

## 2022-08-28 DIAGNOSIS — M797 Fibromyalgia: Secondary | ICD-10-CM | POA: Diagnosis not present

## 2022-08-28 DIAGNOSIS — I152 Hypertension secondary to endocrine disorders: Secondary | ICD-10-CM

## 2022-08-28 MED ORDER — ESOMEPRAZOLE MAGNESIUM 40 MG PO CPDR
DELAYED_RELEASE_CAPSULE | Freq: Every day | ORAL | 3 refills | Status: DC
  Filled 2022-08-28: qty 90, 90d supply, fill #0
  Filled 2022-12-07: qty 90, 90d supply, fill #1
  Filled 2023-03-15: qty 90, 90d supply, fill #2

## 2022-08-28 MED ORDER — ASPIRIN 81 MG PO TBEC
81.0000 mg | DELAYED_RELEASE_TABLET | Freq: Every day | ORAL | 3 refills | Status: DC
  Filled 2022-08-28: qty 90, 90d supply, fill #0
  Filled 2022-12-07: qty 90, 90d supply, fill #1
  Filled 2023-03-16: qty 90, 90d supply, fill #2

## 2022-08-28 MED ORDER — FUROSEMIDE 40 MG PO TABS
ORAL_TABLET | Freq: Every day | ORAL | 3 refills | Status: DC
  Filled 2022-08-28: qty 90, 90d supply, fill #0
  Filled 2022-12-07: qty 90, 90d supply, fill #1
  Filled 2023-03-16: qty 90, 90d supply, fill #2

## 2022-08-28 MED ORDER — BENAZEPRIL HCL 20 MG PO TABS
20.0000 mg | ORAL_TABLET | Freq: Every day | ORAL | 3 refills | Status: DC
  Filled 2022-08-28: qty 90, fill #0
  Filled 2022-10-01: qty 90, 90d supply, fill #0
  Filled 2023-01-27: qty 90, 90d supply, fill #1

## 2022-08-28 MED ORDER — TRIAMTERENE-HCTZ 37.5-25 MG PO TABS
1.0000 | ORAL_TABLET | Freq: Every day | ORAL | 3 refills | Status: DC
  Filled 2022-08-28: qty 90, 90d supply, fill #0
  Filled 2022-12-07: qty 90, 90d supply, fill #1
  Filled 2023-03-16: qty 90, 90d supply, fill #2

## 2022-08-28 MED ORDER — EZETIMIBE 10 MG PO TABS
10.0000 mg | ORAL_TABLET | Freq: Every day | ORAL | 3 refills | Status: DC
  Filled 2022-08-28 – 2022-10-01 (×2): qty 90, 90d supply, fill #0
  Filled 2023-01-27: qty 90, 90d supply, fill #1

## 2022-08-28 MED ORDER — DULOXETINE HCL 40 MG PO CPEP
1.0000 | ORAL_CAPSULE | Freq: Every day | ORAL | 3 refills | Status: DC
  Filled 2022-08-28: qty 90, 90d supply, fill #0
  Filled 2022-12-07: qty 90, 90d supply, fill #1

## 2022-08-28 NOTE — Progress Notes (Signed)
  Worthy Keeler, DNP, AGNP-c [place] [address], [place] [address] [phone] Office [phone] Fax  ESTABLISHED PATIENT- Chronic Health and/or Follow-Up Visit  Blood pressure 122/73, pulse 73, height '5\' 3"'$  (1.6 m), weight 223 lb 12.8 oz (101.5 kg), SpO2 98 %.    Michelle Ray is a 56 y.o. year old female presenting today for evaluation and management of the following: DIABETES Current Medications:  Medication Side Effects:  Taking medications as prescribed:  Yes  Glucose Monitoring:  rarely  Frequency:   Average Fasting BG: Blood Pressure Monitoring:  a few times a week  Hypoglycemic episodes:no Polydipsia/polyuria: no Visual changes: no Chest pain: no Paresthesias: no Diabetic Diet: yes Exercise: yes Retinal Examination: Due Now Foot Exam: Due Today Urine Microalbumin: Due Today Pneumovax: {Blank single:19197::"Up to Date","Due Now","unknown"} Influenza: {Blank single:19197::"Up to Date","Due Now","unknown"} COVID:{Blank single:19197::"Up to Date","Due Now","unknown"}  HYPERTENSION Hypertension status: controlled  Satisfied with current treatment? yes Duration of hypertension: chronic BP monitoring frequency:  a few times a week BP range:  BP medication side effects:  no Medication compliance: excellent compliance Aspirin: no Recurrent headaches: no Visual changes: no Palpitations: no Dyspnea: no Chest pain: no Lower extremity edema: yes Dizzy/lightheaded: yes  WEIGHT She has lost 20lbs!!!!! She is working with [employer]   FIBRO  She has been working out in [employer] and feels that this has been helpful. She finished PT There are no diagnoses linked to this encounter.   All ROS negative with exception of what is listed above.   PHYSICAL EXAM Physical Exam  PLAN Problem List Items Addressed This Visit     Hypertension associated with diabetes ([place])   Controlled type  2 diabetes mellitus with complication, without long-term current use of insulin ([place]) - Primary   Hyperlipidemia associated with type 2 diabetes mellitus ([place])   BMI 40.0-44.9, adult ([place])   Fibromyalgia muscle pain   HM:  Colonoscopy- [place] 2019-  Shingles- needs vaccine Tetanus- 8 years COVID- not getting Flu- at work [employer]- 1.5 years Pap- 3 years ago  No follow-ups on file.   Worthy Keeler, DNP, AGNP-c

## 2022-08-28 NOTE — Patient Instructions (Addendum)
It was a pleasure seeing you today. I hope your time spent with [place] was pleasant and helpful. Please let us know if there is anything we can do to improve the service you receive.   I will be in touch with you about your labs.  Keep up the GREAT work!!  I AM SO PROUD OF YOU!!!  Important Office Information Lab Results If labs were ordered, please note that you will see results through [place] as soon as they come available from [place].  It takes up to 5 business days for the results to be routed to me and for me to review them once all of the lab results have come through from [facility]. I will make recommendations based on your results and send these through [place] or someone from the office will call you to discuss. If your labs are abnormal, we [place] contact you to schedule a visit to discuss the results and make recommendations.  If you have not heard from [place] within 5 business days or you have waited longer than a week and your lab results have not come through on [place], please feel free to call the office or send a message through [place] to follow-up on these labs.   Referrals If referrals were placed today, the office where the referral was sent will contact you either by phone or through [place] to set up scheduling. Please note that it can take up to a week for the referral office to contact you. If you do not hear from them in a week, please contact the referral office directly to inquire about scheduling.   Condition Treated If your condition worsens or you begin to have new symptoms, please schedule a follow-up appointment for further evaluation. If you are not sure if an appointment is needed, you [place] call the office to leave a message for the nurse and someone will contact you with recommendations.  If you have an urgent or life threatening emergency, please do not call the office, but seek emergency evaluation by calling 911 or going to the nearest emergency room for evaluation.    MyChart and Phone Calls Please do not use MyChart for urgent messages. It [place] take up to 3 business days for MyChart messages to be read by staff and if they are unable to handle the request, an additional 3 business days for them to be routed to me and for my response.  Messages sent to the provider through [place] do not come directly to the provider, please allow time for these messages to be routed and for me to respond.  We get a large volume of MyChart messages daily and these are responded to in the order received.   For urgent messages, please call the office at [phone] and speak with the front office staff or leave a message on the line of my assistant for guidance.  We are seeing patients from the hours of 8:00 am through 5:00 pm and calls directly to the nurse [place] not be answered immediately due to seeing patients, but your call will be returned as soon as possible.  Phone  messages received after 4:00 PM Monday through Thursday [place] not be returned until the following business day. Phone messages received after 11:00 AM on Friday [place] not be returned until Monday.   After Hours We share on call hours with providers from other offices. If you have an urgent need after hours that cannot wait until the next business day, please contact the  on call provider by calling the office number. A nurse will speak with you and contact the provider if needed for recommendations.  If you have an urgent or life threatening emergency after hours, please do not call the on call provider, but seek emergency evaluation by calling 911 or going to the nearest emergency room for evaluation.   Paperwork All paperwork requires a minimum of 5 days to complete and return to you or the designated personnel. Please keep this in mind when bringing in forms or sending requests for paperwork completion to the office.

## 2022-08-29 LAB — COMPREHENSIVE METABOLIC PANEL
ALT: 17 IU/L (ref 0–32)
AST: 19 IU/L (ref 0–40)
Albumin/Globulin Ratio: 1.5 (ref 1.2–2.2)
Albumin: 4.6 g/dL (ref 3.8–4.9)
Alkaline Phosphatase: 56 IU/L (ref 44–121)
BUN/Creatinine Ratio: 20 (ref 9–23)
BUN: 23 mg/dL (ref 6–24)
Bilirubin Total: 0.3 mg/dL (ref 0.0–1.2)
CO2: 22 mmol/L (ref 20–29)
Calcium: 10.1 mg/dL (ref 8.7–10.2)
Chloride: 103 mmol/L (ref 96–106)
Creatinine, Ser: 1.15 mg/dL — ABNORMAL HIGH (ref 0.57–1.00)
Globulin, Total: 3 g/dL (ref 1.5–4.5)
Glucose: 96 mg/dL (ref 70–99)
Potassium: 4.2 mmol/L (ref 3.5–5.2)
Sodium: 142 mmol/L (ref 134–144)
Total Protein: 7.6 g/dL (ref 6.0–8.5)
eGFR: 55 mL/min/{1.73_m2} — ABNORMAL LOW (ref >59)

## 2022-08-29 LAB — CBC WITH DIFFERENTIAL/PLATELET
Basophils Absolute: 0.1 10*3/uL (ref 0.0–0.2)
Basos: 1 %
EOS (ABSOLUTE): 0.2 10*3/uL (ref 0.0–0.4)
Eos: 4 %
Hematocrit: 40.1 % (ref 34.0–46.6)
Hemoglobin: 13.3 g/dL (ref 11.1–15.9)
Immature Grans (Abs): 0 10*3/uL (ref 0.0–0.1)
Immature Granulocytes: 0 %
Lymphocytes Absolute: 2.3 10*3/uL (ref 0.7–3.1)
Lymphs: 36 %
MCH: 30.3 pg (ref 26.6–33.0)
MCHC: 33.2 g/dL (ref 31.5–35.7)
MCV: 91 fL (ref 79–97)
Monocytes Absolute: 0.5 10*3/uL (ref 0.1–0.9)
Monocytes: 8 %
Neutrophils Absolute: 3.3 10*3/uL (ref 1.4–7.0)
Neutrophils: 51 %
Platelets: 366 10*3/uL (ref 150–450)
RBC: 4.39 x10E6/uL (ref 3.77–5.28)
RDW: 12.3 % (ref 11.7–15.4)
WBC: 6.4 10*3/uL (ref 3.4–10.8)

## 2022-08-29 LAB — LIPID PANEL
Chol/HDL Ratio: 4 ratio (ref 0.0–4.4)
Cholesterol, Total: 171 mg/dL (ref 100–199)
HDL: 43 mg/dL (ref >39)
LDL Chol Calc (NIH): 75 mg/dL (ref 0–99)
Triglycerides: 332 mg/dL — ABNORMAL HIGH (ref 0–149)
VLDL Cholesterol Cal: 53 mg/dL — ABNORMAL HIGH (ref 5–40)

## 2022-08-29 LAB — HEMOGLOBIN A1C
Est. average glucose Bld gHb Est-mCnc: 108 mg/dL
Hgb A1c MFr Bld: 5.4 % (ref 4.8–5.6)

## 2022-08-30 NOTE — Assessment & Plan Note (Signed)
Chronic.  Blood pressure well controlled at this time.  Patient is doing fantastic work with diet and weight loss.  Encouraged continuation of current efforts.  Labs pending.

## 2022-08-30 NOTE — Assessment & Plan Note (Signed)
Chronic.  No alarm symptoms at this time.  Labs pending. 

## 2022-08-30 NOTE — Assessment & Plan Note (Signed)
20 pound weight loss in the last few months with dietary changes and exercise.  She is doing absolutely amazing!  Patient praised for her efforts and encouraged to continue.  We will monitor labs today.

## 2022-08-30 NOTE — Assessment & Plan Note (Addendum)
Chronic.  Patient taking medication as prescribed with no side effects.  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  I am so proud of all of her efforts!  We will obtain labs today.  Continue current treatment at this time.

## 2022-08-30 NOTE — Assessment & Plan Note (Signed)
Chronic.  She tells me that she is still experiencing pain but she is able to manage this.  She has been working out in [employer] which has been helpful.  Encouraged continued efforts.  We will continue to monitor.

## 2022-08-31 ENCOUNTER — Other Ambulatory Visit (HOSPITAL_COMMUNITY): Payer: Self-pay

## 2022-09-08 ENCOUNTER — Encounter (HOSPITAL_BASED_OUTPATIENT_CLINIC_OR_DEPARTMENT_OTHER): Payer: Self-pay | Admitting: Nurse Practitioner

## 2022-09-08 NOTE — Addendum Note (Signed)
Addended by: Naz Denunzio, Clarise Cruz E on: 09/08/2022 07:19 AM   Modules accepted: Orders

## 2022-09-27 ENCOUNTER — Encounter (HOSPITAL_BASED_OUTPATIENT_CLINIC_OR_DEPARTMENT_OTHER): Payer: Self-pay | Admitting: Nurse Practitioner

## 2022-09-28 ENCOUNTER — Other Ambulatory Visit (HOSPITAL_COMMUNITY): Payer: Self-pay

## 2022-09-28 ENCOUNTER — Other Ambulatory Visit (HOSPITAL_BASED_OUTPATIENT_CLINIC_OR_DEPARTMENT_OTHER): Payer: Self-pay | Admitting: Nurse Practitioner

## 2022-09-28 DIAGNOSIS — M109 Gout, unspecified: Secondary | ICD-10-CM

## 2022-09-28 MED ORDER — PREDNISONE 20 MG PO TABS
ORAL_TABLET | ORAL | 0 refills | Status: AC
  Filled 2022-09-28: qty 13, 7d supply, fill #0

## 2022-10-01 ENCOUNTER — Other Ambulatory Visit (HOSPITAL_COMMUNITY): Payer: Self-pay

## 2022-10-02 ENCOUNTER — Other Ambulatory Visit (HOSPITAL_COMMUNITY): Payer: Self-pay

## 2022-10-08 ENCOUNTER — Other Ambulatory Visit (HOSPITAL_COMMUNITY): Payer: Self-pay

## 2022-10-08 ENCOUNTER — Encounter: Payer: Self-pay | Admitting: Nurse Practitioner

## 2022-10-08 ENCOUNTER — Ambulatory Visit: Payer: 59 | Admitting: Nurse Practitioner

## 2022-10-08 ENCOUNTER — Other Ambulatory Visit (HOSPITAL_COMMUNITY)
Admission: RE | Admit: 2022-10-08 | Discharge: 2022-10-08 | Disposition: A | Discharge status: Home / Self Care | Payer: 59 | Source: Ambulatory Visit | Attending: Nurse Practitioner | Admitting: Nurse Practitioner

## 2022-10-08 VITALS — BP 112/76 | HR 82 | Resp 16 | Ht 61.75 in | Wt 217.0 lb

## 2022-10-08 DIAGNOSIS — Z780 Asymptomatic menopausal state: Secondary | ICD-10-CM

## 2022-10-08 DIAGNOSIS — N951 Menopausal and female climacteric states: Secondary | ICD-10-CM

## 2022-10-08 DIAGNOSIS — Z8262 Family history of osteoporosis: Secondary | ICD-10-CM

## 2022-10-08 DIAGNOSIS — L292 Pruritus vulvae: Secondary | ICD-10-CM

## 2022-10-08 DIAGNOSIS — L900 Lichen sclerosus et atrophicus: Secondary | ICD-10-CM

## 2022-10-08 DIAGNOSIS — Z01419 Encounter for gynecological examination (general) (routine) without abnormal findings: Secondary | ICD-10-CM

## 2022-10-08 LAB — WET PREP FOR TRICH, YEAST, CLUE

## 2022-10-08 MED ORDER — CLOBETASOL PROPIONATE 0.05 % EX OINT
1.0000 | TOPICAL_OINTMENT | CUTANEOUS | 2 refills | Status: DC
  Filled 2022-10-08: qty 30, 90d supply, fill #0
  Filled 2022-12-07 – 2023-01-27 (×2): qty 30, 90d supply, fill #1
  Filled 2023-07-18: qty 30, 90d supply, fill #2

## 2022-10-08 MED ORDER — ESTRADIOL 0.1 MG/GM VA CREA
1.0000 | TOPICAL_CREAM | VAGINAL | 2 refills | Status: AC
  Filled 2022-10-08: qty 42.5, 30d supply, fill #0
  Filled 2023-01-27: qty 42.5, 30d supply, fill #1
  Filled 2023-07-18: qty 42.5, 30d supply, fill #2

## 2022-10-08 NOTE — Progress Notes (Addendum)
Michelle Ray 1966-10-01 [id number]   History:  56 y.o. G3P0 presents as new patient to establish care. Complains of intermittent external itching that has been ongoing for months. Also has vaginal dryness and pain with intercourse. Uses [place] suppositories.  Postmenopausal - no HRT, no bleeding. Mild hot flashes, these have improved. H/O T2DM, HLD, HTN.   Gynecologic History No LMP recorded. Patient has had an ablation.   Contraception/Family planning: post menopausal status Sexually active: Yes  Health Maintenance Last Pap: 3-4 years ago. Results were: Normal per patient Last mammogram: 04/03/2021. Results were: Normal Last colonoscopy: 2019. Results were: Polyps, 5-year recall Last Dexa: Never  Past medical history, past surgical history, family history and social history were all reviewed and documented in the EPIC chart. 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 Mother diagnosed with breast cancer at age 13.   ROS:  A ROS was performed and pertinent positives and negatives are included.  Exam:  Vitals:   10/08/22 0944  BP: 112/76  Pulse: 82  Resp: 16  Weight: 217 lb (98.4 kg)  Height: 5' 1.75" (1.568 m)   Body mass index is 40.01 kg/m.  General appearance:  Normal Thyroid:  Symmetrical, normal in size, without palpable masses or nodularity. Respiratory  Auscultation:  Clear without wheezing or rhonchi Cardiovascular  Auscultation:  Regular rate, without rubs, murmurs or gallops  Edema/varicosities:  Not grossly evident Abdominal  Soft,nontender, without masses, guarding or rebound.  Liver/spleen:  No organomegaly noted  Hernia:  None appreciated  Skin  Inspection:  Grossly normal Breasts: Examined lying and sitting.   Right: Without masses, retractions, nipple discharge or axillary adenopathy.   Left: Without masses, retractions, nipple discharge or axillary adenopathy. Genitourinary   Inguinal/mons:  Normal without inguinal adenopathy  External genitalia:   Hypopigmentation in figure-8 pattern c/w LS  BUS/Urethra/Skene's glands:  Normal  Vagina:  Normal appearing with normal color and discharge, no lesions  Cervix:  Normal appearing without discharge or lesions  Uterus:  Normal in size, shape and contour.  Midline and mobile, nontender  Adnexa/parametria:     Rt: Normal in size, without masses or tenderness.   Lt: Normal in size, without masses or tenderness.  Anus and perineum: Normal  Digital rectal exam: Normal sphincter tone without palpated masses or tenderness  Patient informed chaperone available to be present for breast and pelvic exam. Patient has requested no chaperone to be present. Patient has been advised what will be completed during breast and pelvic exam.   Wet prep negative  Assessment/Plan:  56 y.o. G3P0 to establish care.   Well female exam with routine gynecological exam - Plan: Cytology - PAP( [facility]). Education provided on SBEs, importance of preventative screenings, current guidelines, high calcium diet, regular exercise, and multivitamin daily. Labs with PCP.   Postmenopausal - Plan: DG Bone Density. No HRT, no bleeding.   Family history of osteoporosis in mother - Plan: DG Bone Density  Menopausal vaginal dryness - Plan: estradiol (ESTRACE VAGINAL) 0.1 MG/GM vaginal cream twice weekly. Will use nightly x 2 weeks initially. Also recommend oil-based lubricant such as coconut oil during intercourse. She will wait to start this until she is on maintenance dose of clobetasol.   Lichen sclerosus - Plan: clobetasol ointment (TEMOVATE) 0.05 % BID x 2 weeks, then daily x 2 weeks, then twice weekly. Educated on lichen sclerosus, management, and small risk for vulvar cancer.   Vulvar itching - Plan: WET PREP FOR [place], YEAST, CLUE. Negative wet prep.  Screening for cervical cancer - Normal Pap history.  Pap today.   Screening for breast cancer - Normal mammogram history.  Overdue and encouraged to schedule. Normal  breast exam today.  Screening for colon cancer - 2019 colonoscopy. Will repeat at 5-year interval per GI's recommendation.   Return in 1 year for annual.     Tamela Gammon DNP, 10:54 AM 10/08/2022

## 2022-10-09 LAB — CYTOLOGY - PAP
Comment: NEGATIVE
Diagnosis: NEGATIVE
High risk HPV: NEGATIVE

## 2022-10-20 ENCOUNTER — Ambulatory Visit (INDEPENDENT_AMBULATORY_CARE_PROVIDER_SITE_OTHER): Payer: 59

## 2022-10-20 ENCOUNTER — Other Ambulatory Visit: Payer: Self-pay | Admitting: Nurse Practitioner

## 2022-10-20 DIAGNOSIS — M85851 Other specified disorders of bone density and structure, right thigh: Secondary | ICD-10-CM

## 2022-10-20 DIAGNOSIS — Z8262 Family history of osteoporosis: Secondary | ICD-10-CM

## 2022-10-20 DIAGNOSIS — Z13820 Encounter for screening for osteoporosis: Secondary | ICD-10-CM

## 2022-10-20 DIAGNOSIS — Z780 Asymptomatic menopausal state: Secondary | ICD-10-CM

## 2022-10-20 DIAGNOSIS — Z01419 Encounter for gynecological examination (general) (routine) without abnormal findings: Secondary | ICD-10-CM

## 2022-10-20 DIAGNOSIS — N951 Menopausal and female climacteric states: Secondary | ICD-10-CM

## 2022-10-20 DIAGNOSIS — M85852 Other specified disorders of bone density and structure, left thigh: Secondary | ICD-10-CM

## 2022-10-20 DIAGNOSIS — L900 Lichen sclerosus et atrophicus: Secondary | ICD-10-CM

## 2022-10-20 DIAGNOSIS — L292 Pruritus vulvae: Secondary | ICD-10-CM

## 2022-10-29 ENCOUNTER — Other Ambulatory Visit (HOSPITAL_COMMUNITY): Payer: Self-pay

## 2022-10-29 ENCOUNTER — Ambulatory Visit: Payer: 59 | Admitting: Nurse Practitioner

## 2022-10-29 ENCOUNTER — Encounter: Payer: Self-pay | Admitting: Nurse Practitioner

## 2022-10-29 VITALS — BP 124/80 | HR 72 | Temp 98.5°F | Wt 216.4 lb

## 2022-10-29 DIAGNOSIS — N270 Small kidney, unilateral: Secondary | ICD-10-CM

## 2022-10-29 DIAGNOSIS — E1169 Type 2 diabetes mellitus with other specified complication: Secondary | ICD-10-CM

## 2022-10-29 DIAGNOSIS — I152 Hypertension secondary to endocrine disorders: Secondary | ICD-10-CM

## 2022-10-29 DIAGNOSIS — E118 Type 2 diabetes mellitus with unspecified complications: Secondary | ICD-10-CM

## 2022-10-29 DIAGNOSIS — I700 Atherosclerosis of aorta: Secondary | ICD-10-CM

## 2022-10-29 DIAGNOSIS — Z6841 Body Mass Index (BMI) 40.0 and over, adult: Secondary | ICD-10-CM

## 2022-10-29 DIAGNOSIS — E1159 Type 2 diabetes mellitus with other circulatory complications: Secondary | ICD-10-CM | POA: Diagnosis not present

## 2022-10-29 DIAGNOSIS — G2581 Restless legs syndrome: Secondary | ICD-10-CM

## 2022-10-29 DIAGNOSIS — E785 Hyperlipidemia, unspecified: Secondary | ICD-10-CM | POA: Diagnosis not present

## 2022-10-29 DIAGNOSIS — M797 Fibromyalgia: Secondary | ICD-10-CM | POA: Diagnosis not present

## 2022-10-29 MED ORDER — CARBIDOPA-LEVODOPA 25-250 MG PO TABS
1.0000 | ORAL_TABLET | Freq: Every day | ORAL | 11 refills | Status: DC
  Filled 2022-10-29: qty 30, 30d supply, fill #0

## 2022-10-29 MED ORDER — TRAMADOL HCL 50 MG PO TABS
50.0000 mg | ORAL_TABLET | Freq: Three times a day (TID) | ORAL | 2 refills | Status: DC | PRN
  Filled 2022-10-29: qty 60, 20d supply, fill #0
  Filled 2022-12-07: qty 60, 20d supply, fill #1
  Filled 2023-03-15: qty 60, 20d supply, fill #2

## 2022-10-29 NOTE — Patient Instructions (Signed)
It was so great to see you!!! Please let me know if you need anything.  Let me know if stopping the zetia helps with your pain at all.

## 2022-10-29 NOTE — Progress Notes (Signed)
Worthy Keeler, DNP, AGNP-c [facility]  [address] [place], [place] [address] [id number]  ESTABLISHED PATIENT- Chronic Health and/or Follow-Up Visit  Blood pressure 124/80, pulse 72, temperature 98.5 F (36.9 C), weight 216 lb 6.4 oz (98.2 kg).    Michelle Ray is a 56 y.o. year old female presenting today for evaluation and management of the following: Fibromyalgia  Annye reports she is still in pain and most of the time it is severe in nature and not well controlled. She feels her pain Pfeifle be a little worse since starting on the zetia, but she is not sure. She is taking the medications as prescribed and feels that they Sorey be helping a little. She is open to increasing her duloxetine to see if this is helpful for her symptoms.  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 She has concerns with continuing in this position as it is very labor intensive.   Hypertension, follow-up HYPERTENSION / HYPERLIPIDEMIA Satisfied with current treatment?  Yes overall- possibly zetia causing increased muscle pain Duration of hypertension: chronic Medication compliance: excellent compliance BP monitoring frequency: a few times a week BP range:  BP medication side effects: no Cholesterol medication side effects:  possibly- see above Cholesterol supplements: none Duration of hyperlipidemia: chronic Aspirin: yes Recent stressors: yes Recurrent headaches: no Visual changes: no Palpitations: no Dyspnea: no Chest pain: no Lower extremity edema: yes Dizzy/lightheaded: no  BP Readings from Last 3 Encounters:  10/29/22 124/80  10/08/22 112/76  08/28/22 122/73   Wt Readings from Last 3 Encounters:  10/29/22 216 lb 6.4 oz (98.2 kg)  10/08/22 217 lb (98.4 kg)  08/28/22 223 lb 12.8 oz (101.5 kg)     Pertinent labs Lab Results  Component Value Date   CHOL 185 10/29/2022   HDL 50 10/29/2022   LDLCALC 94 10/29/2022   TRIG 246  (H) 10/29/2022   CHOLHDL 3.7 10/29/2022   Lab Results  Component Value Date   NA 142 10/29/2022   K 3.5 10/29/2022   CREATININE 1.12 (H) 10/29/2022   EGFR 57 (L) 10/29/2022   GLUCOSE 102 (H) 10/29/2022   TSH 1.450 02/17/2021     The 10-year ASCVD risk score (Arnett DK, et al., 2019) is: 6.5%  --------------------------------------------------------------------------------------------------- CHRONIC KIDNEY DISEASE CKD status: stable Medications renally dose: yes Previous renal evaluation: yes Pneumovax:  Up to Date Influenza Vaccine:  Up to Date ____________________________________________________________  Weight Loss Management Weight Loss Medications: Mounjaro Side Effects from Medication: none Monitoring carbohydrates: yes Monitoring Fat Intake:yes Weekly Exercise: yes Water Intake Daily: at least 64 ounces  Diabetes Mellitus Type II, Follow-up: Patient here for follow-up of Type 2 diabetes mellitus.  Current symptoms/problems include none and have been improving. Symptoms have been present for several years.  Known diabetic complications: cardiovascular disease Cardiovascular risk factors: diabetes mellitus, dyslipidemia, hypertension, and obesity (BMI >= 30 kg/m2) Current diabetic medications include  Mounjaro .   Eye exam current (within one year): yes Weight trend: decreasing steadily Prior visit with dietician: yes -  Current diet: diabetic Current exercise: walking  Current monitoring regimen: home blood tests - weekly Home blood sugar records:  stable Any episodes of hypoglycemia? no  Is She on ACE inhibitor or angiotensin II receptor blocker?  Yes  benazepril (Lotensin)     10/29/2022    9:15 AM 10/08/2022    9:44 AM 08/28/2022    8:24 AM  Vitals with BMI  Height  5' 1.75" _0   Weight 216 lbs 6 oz 217 lbs 223 lbs 13 oz  BMI 39.92 46.28 63.81  Systolic 771 165 790  Diastolic 80 76 73  Pulse 72 82 73    Lab Results  Component Value Date    HGBA1C 5.4 08/28/2022   All ROS negative with exception of what is listed above.   PHYSICAL EXAM Physical Exam Vitals and nursing note reviewed.  Constitutional:      General: She is not in acute distress.    Appearance: Normal appearance.  HENT:     Head: Normocephalic.  Eyes:     Extraocular Movements: Extraocular movements intact.     Conjunctiva/sclera: Conjunctivae normal.     Pupils: Pupils are equal, round, and reactive to light.  Neck:     Vascular: No carotid bruit.  Cardiovascular:     Rate and Rhythm: Normal rate and regular rhythm.     Pulses: Normal pulses.     Heart sounds: Normal heart sounds. No murmur heard. Pulmonary:     Effort: Pulmonary effort is normal.     Breath sounds: Normal breath sounds. No wheezing.  Abdominal:     General: Bowel sounds are normal. There is no distension.     Palpations: Abdomen is soft.     Tenderness: There is no abdominal tenderness. There is no guarding.  Musculoskeletal:        General: Normal range of motion.     Cervical back: Normal range of motion and neck supple.     Right lower leg: No edema.     Left lower leg: No edema.  Lymphadenopathy:     Cervical: No cervical adenopathy.  Skin:    General: Skin is warm and dry.     Capillary Refill: Capillary refill takes less than 2 seconds.  Neurological:     General: No focal deficit present.     Mental Status: She is alert and oriented to person, place, and time.  Psychiatric:        Mood and Affect: Mood normal.        Behavior: Behavior normal.        Thought Content: Thought content normal.        Judgment: Judgment normal.     PLAN Problem List Items Addressed This Visit     Hypertension associated with diabetes ([facility]) - Primary    Chronic.  No alarm symptoms present at this time.  Goal blood pressure less than less than 130/80.  Refills have been provided today.  Labs today to check electrolytes and kidney function.  Recommend current treatment plan is  effective, no change in therapy, orders and follow up as documented in EpicCare, reviewed diet, exercise and weight control, labs ordered and recent results reviewed with patient. Currently is followed with cardiology. We will make changes to plan of care as necessary based on lab results. Plan to follow-up in 68month.       Relevant Orders   Comprehensive metabolic panel (Completed)   CBC with Differential/Platelet (Completed)   Lipid panel (Completed)   Microalbumin/Creatinine Ratio, Urine (Completed)   Controlled type 2 diabetes mellitus with complication, without long-term current use of insulin (HCC)    Chronic. Doing well on mounjaro. No alarm sx present at this time. Recommend continue with diet and exercise as well as current medications. Labs pending.       Relevant Orders   Comprehensive metabolic panel (Completed)   CBC with Differential/Platelet (Completed)  Lipid panel (Completed)   Microalbumin/Creatinine Ratio, Urine (Completed)   Hyperlipidemia associated with type 2 diabetes mellitus (HCC)    Chronic. No alarm sx present today. DM, HTN controlled. She is working on steadily losing weight. Pause zetia for a few weeks given the increase in pain to see if this improves. Will make changes based on patients response.       Relevant Orders   Comprehensive metabolic panel (Completed)   CBC with Differential/Platelet (Completed)   Lipid panel (Completed)   Microalbumin/Creatinine Ratio, Urine (Completed)   BMI 40.0-44.9, adult (HCC)    Continue mounjaro. Doing very well on this regimen. No alarm sx at this time. Continue with diet and exercise. F/U in 6 months.       Relevant Orders   CBC with Differential/Platelet (Completed)   Lipid panel (Completed)   Aortic atherosclerosis (HCC)    Chronic. DM, weight, and BP improving. Possible worsening msk pain from zetia. Will plan to hold medication for a few weeks to see if her pain improves. We can always restart if needed. No  alarm sx present at this time. Continue diet and exercise.       Relevant Orders   Comprehensive metabolic panel (Completed)   CBC with Differential/Platelet (Completed)   Lipid panel (Completed)   Microalbumin/Creatinine Ratio, Urine (Completed)   Small right kidney    Will re-evaluate kidney function today. Continue to work towards weight loss, DM, HLD, and HTN goals.       Relevant Orders   CBC with Differential/Platelet (Completed)   Microalbumin/Creatinine Ratio, Urine (Completed)   RLS (restless legs syndrome)    Chronic. Not well controlled. Increase sinemet. F/U PRN      Relevant Medications   carbidopa-levodopa (SINEMET) 25-250 MG tablet   Fibromyalgia muscle pain    Chronic. No well controlled. At this time we will plan to trial an increase in duloxetine and prn tramadol for severe days. Continue to exercise and monitor for symptoms that correlate with food, weather, or specific activities.       Relevant Medications   traMADol (ULTRAM) 50 MG tablet    Return in about 6 months (around 04/29/2023) for HLD, HTN, DM2.   Worthy Keeler, DNP, AGNP-c

## 2022-10-31 LAB — CBC WITH DIFFERENTIAL/PLATELET
Basophils Absolute: 0.1 10*3/uL (ref 0.0–0.2)
Basos: 1 %
EOS (ABSOLUTE): 0.2 10*3/uL (ref 0.0–0.4)
Eos: 3 %
Hematocrit: 40.6 % (ref 34.0–46.6)
Hemoglobin: 13.5 g/dL (ref 11.1–15.9)
Immature Grans (Abs): 0 10*3/uL (ref 0.0–0.1)
Immature Granulocytes: 0 %
Lymphocytes Absolute: 1.9 10*3/uL (ref 0.7–3.1)
Lymphs: 31 %
MCH: 30 pg (ref 26.6–33.0)
MCHC: 33.3 g/dL (ref 31.5–35.7)
MCV: 90 fL (ref 79–97)
Monocytes Absolute: 0.5 10*3/uL (ref 0.1–0.9)
Monocytes: 9 %
Neutrophils Absolute: 3.3 10*3/uL (ref 1.4–7.0)
Neutrophils: 56 %
Platelets: 396 10*3/uL (ref 150–450)
RBC: 4.5 x10E6/uL (ref 3.77–5.28)
RDW: 13.4 % (ref 11.7–15.4)
WBC: 5.9 10*3/uL (ref 3.4–10.8)

## 2022-10-31 LAB — MICROALBUMIN / CREATININE URINE RATIO
Creatinine, Urine: 18.8 mg/dL
Microalb/Creat Ratio: 32 mg/g creat — ABNORMAL HIGH (ref 0–29)
Microalbumin, Urine: 6 ug/mL

## 2022-10-31 LAB — LIPID PANEL
Chol/HDL Ratio: 3.7 ratio (ref 0.0–4.4)
Cholesterol, Total: 185 mg/dL (ref 100–199)
HDL: 50 mg/dL (ref >39)
LDL Chol Calc (NIH): 94 mg/dL (ref 0–99)
Triglycerides: 246 mg/dL — ABNORMAL HIGH (ref 0–149)
VLDL Cholesterol Cal: 41 mg/dL — ABNORMAL HIGH (ref 5–40)

## 2022-10-31 LAB — COMPREHENSIVE METABOLIC PANEL
ALT: 17 IU/L (ref 0–32)
AST: 17 IU/L (ref 0–40)
Albumin/Globulin Ratio: 1.6 (ref 1.2–2.2)
Albumin: 4.9 g/dL (ref 3.8–4.9)
Alkaline Phosphatase: 59 IU/L (ref 44–121)
BUN/Creatinine Ratio: 22 (ref 9–23)
BUN: 25 mg/dL — ABNORMAL HIGH (ref 6–24)
Bilirubin Total: 0.5 mg/dL (ref 0.0–1.2)
CO2: 23 mmol/L (ref 20–29)
Calcium: 10 mg/dL (ref 8.7–10.2)
Chloride: 100 mmol/L (ref 96–106)
Creatinine, Ser: 1.12 mg/dL — ABNORMAL HIGH (ref 0.57–1.00)
Globulin, Total: 3 g/dL (ref 1.5–4.5)
Glucose: 102 mg/dL — ABNORMAL HIGH (ref 70–99)
Potassium: 3.5 mmol/L (ref 3.5–5.2)
Sodium: 142 mmol/L (ref 134–144)
Total Protein: 7.9 g/dL (ref 6.0–8.5)
eGFR: 57 mL/min/{1.73_m2} — ABNORMAL LOW (ref >59)

## 2022-11-04 NOTE — Assessment & Plan Note (Signed)
>>  ASSESSMENT AND PLAN FOR SMALL RIGHT KIDNEY WRITTEN ON 11/04/2022  4:55 PM BY Kimyatta Lecy E, NP  Will re-evaluate kidney function today. Continue to work towards weight loss, DM, HLD, and HTN goals.

## 2022-11-04 NOTE — Assessment & Plan Note (Signed)
Chronic. Not well controlled. Increase sinemet. F/U PRN

## 2022-11-04 NOTE — Assessment & Plan Note (Signed)
Chronic. Doing well on mounjaro. No alarm sx present at this time. Recommend continue with diet and exercise as well as current medications. Labs pending.

## 2022-11-04 NOTE — Assessment & Plan Note (Signed)
Chronic. DM, weight, and BP improving. Possible worsening msk pain from zetia. Will plan to hold medication for a few weeks to see if her pain improves. We can always restart if needed. No alarm sx present at this time. Continue diet and exercise.

## 2022-11-04 NOTE — Assessment & Plan Note (Signed)
Continue mounjaro. Doing very well on this regimen. No alarm sx at this time. Continue with diet and exercise. F/U in 6 months.

## 2022-11-04 NOTE — Assessment & Plan Note (Signed)
Will re-evaluate kidney function today. Continue to work towards weight loss, DM, HLD, and HTN goals.

## 2022-11-04 NOTE — Assessment & Plan Note (Signed)
Chronic.  No alarm symptoms present at this time.  Goal blood pressure less than less than 130/80.  Refills have been provided today.  Labs today to check electrolytes and kidney function.  Recommend current treatment plan is effective, no change in therapy, orders and follow up as documented in EpicCare, reviewed diet, exercise and weight control, labs ordered and recent results reviewed with patient. Currently is followed with cardiology. We will make changes to plan of care as necessary based on lab results. Plan to follow-up in 10month.

## 2022-11-04 NOTE — Assessment & Plan Note (Signed)
Chronic. No well controlled. At this time we will plan to trial an increase in duloxetine and prn tramadol for severe days. Continue to exercise and monitor for symptoms that correlate with food, weather, or specific activities.

## 2022-11-04 NOTE — Assessment & Plan Note (Signed)
Chronic. No alarm sx present today. DM, HTN controlled. She is working on steadily losing weight. Pause zetia for a few weeks given the increase in pain to see if this improves. Will make changes based on patients response.

## 2022-11-24 ENCOUNTER — Encounter (HOSPITAL_BASED_OUTPATIENT_CLINIC_OR_DEPARTMENT_OTHER): Payer: Self-pay | Admitting: Emergency Medicine

## 2022-11-24 ENCOUNTER — Emergency Department (HOSPITAL_BASED_OUTPATIENT_CLINIC_OR_DEPARTMENT_OTHER)
Admission: EM | Admit: 2022-11-24 | Discharge: 2022-11-24 | Discharge status: Against Medical Advice | Payer: 59 | Attending: Emergency Medicine | Admitting: Emergency Medicine

## 2022-11-24 ENCOUNTER — Emergency Department (HOSPITAL_BASED_OUTPATIENT_CLINIC_OR_DEPARTMENT_OTHER): Payer: 59 | Admitting: Radiology

## 2022-11-24 DIAGNOSIS — Z5321 Procedure and treatment not carried out due to patient leaving prior to being seen by health care provider: Secondary | ICD-10-CM | POA: Diagnosis not present

## 2022-11-24 DIAGNOSIS — M25571 Pain in right ankle and joints of right foot: Secondary | ICD-10-CM | POA: Insufficient documentation

## 2022-11-24 DIAGNOSIS — M7731 Calcaneal spur, right foot: Secondary | ICD-10-CM | POA: Diagnosis not present

## 2022-11-24 NOTE — ED Triage Notes (Signed)
R ankle pain, no known injury. Has been on her feet at work the last 4 days.

## 2022-11-24 NOTE — ED Notes (Signed)
Called to room x3 by [employer]. No response

## 2022-12-04 ENCOUNTER — Other Ambulatory Visit (HOSPITAL_BASED_OUTPATIENT_CLINIC_OR_DEPARTMENT_OTHER): Payer: Self-pay

## 2022-12-04 ENCOUNTER — Inpatient Hospital Stay (HOSPITAL_BASED_OUTPATIENT_CLINIC_OR_DEPARTMENT_OTHER)
Admission: RE | Admit: 2022-12-04 | Payer: Commercial Managed Care - PPO | Source: Ambulatory Visit | Admitting: Radiology

## 2022-12-04 ENCOUNTER — Ambulatory Visit (HOSPITAL_BASED_OUTPATIENT_CLINIC_OR_DEPARTMENT_OTHER): Payer: Commercial Managed Care - PPO | Admitting: Orthopaedic Surgery

## 2022-12-04 DIAGNOSIS — Z1231 Encounter for screening mammogram for malignant neoplasm of breast: Secondary | ICD-10-CM

## 2022-12-07 ENCOUNTER — Other Ambulatory Visit (HOSPITAL_COMMUNITY): Payer: Self-pay

## 2022-12-08 ENCOUNTER — Other Ambulatory Visit (HOSPITAL_COMMUNITY): Payer: Self-pay

## 2022-12-08 ENCOUNTER — Other Ambulatory Visit: Payer: Self-pay

## 2022-12-09 ENCOUNTER — Other Ambulatory Visit (HOSPITAL_COMMUNITY): Payer: Self-pay

## 2022-12-11 ENCOUNTER — Other Ambulatory Visit (HOSPITAL_COMMUNITY): Payer: Self-pay

## 2022-12-12 ENCOUNTER — Other Ambulatory Visit (HOSPITAL_COMMUNITY): Payer: Self-pay

## 2022-12-14 ENCOUNTER — Other Ambulatory Visit (HOSPITAL_COMMUNITY): Payer: Self-pay

## 2022-12-15 ENCOUNTER — Other Ambulatory Visit (HOSPITAL_COMMUNITY): Payer: Self-pay

## 2022-12-17 ENCOUNTER — Other Ambulatory Visit (HOSPITAL_COMMUNITY): Payer: Self-pay

## 2022-12-18 ENCOUNTER — Ambulatory Visit (HOSPITAL_BASED_OUTPATIENT_CLINIC_OR_DEPARTMENT_OTHER)
Admission: RE | Admit: 2022-12-18 | Discharge: 2022-12-18 | Disposition: A | Discharge status: Home / Self Care | Payer: Commercial Managed Care - PPO | Source: Ambulatory Visit | Attending: Nurse Practitioner | Admitting: Nurse Practitioner

## 2022-12-18 ENCOUNTER — Ambulatory Visit (HOSPITAL_BASED_OUTPATIENT_CLINIC_OR_DEPARTMENT_OTHER): Payer: Commercial Managed Care - PPO | Admitting: Orthopaedic Surgery

## 2022-12-18 DIAGNOSIS — Z1231 Encounter for screening mammogram for malignant neoplasm of breast: Secondary | ICD-10-CM | POA: Diagnosis not present

## 2022-12-18 DIAGNOSIS — M25371 Other instability, right ankle: Secondary | ICD-10-CM | POA: Diagnosis not present

## 2022-12-18 NOTE — Progress Notes (Signed)
Chief Complaint: Right ankle pain     History of Present Illness:   12/18/2022: Presents today for follow-up of her right ankle.  She states that she has had significant swelling since December 26 when she went to the emergency room.  She does have a history of very severe sprain at the age of 18 which have recurred later in life.  She states that she is experiencing sprains nearly annually.  At this point she is having deep pain in the ankle.  This is always been on and off although essentially constant for the most recent months.  Chrishelle Wojdyla is a 57 y.o. female presents today with left knee pain which has been ongoing for 1-1/2 years.  She experiences occasional crepitus and popping in the medial aspect of the knee.  She does take Tylenol and ibuprofen as needed.  She does feel that the knee will buckle occasionally give out.  She experiences the pain predominantly about the medial foot as well as the posterior aspect of the knee.  She works as an Warden/ranger at [employer].  No history of prior surgery.  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)    Constipation    uses OTC stool softener prn only- this is not chronic   Diabetes mellitus without complication (HCC)    GERD (gastroesophageal reflux disease)    Hyperlipemia    Hyperlipidemia    Hypertension    Past Surgical History:  Procedure Laterality Date   ABLATION     ENDOVENOUS ABLATION SAPHENOUS VEIN W/ LASER Right 01/28/2022   endovenous laser ablation right greater saphenous vein and stab phlebectomy 10-20 incisions right leg by Gae Gallop MD   TUBAL LIGATION     uterine fibroid removal     WISDOM TOOTH EXTRACTION     Social History    Socioeconomic History   Marital status: Married    Spouse name: Randall Hiss   Number of children: Not on file   Years of education: Not on file   Highest education level: Master's degree (e.g., MA, MS, MEng, MEd, MSW, MBA)  Occupational History   Occupation: RN4    Employer: [employer]    Comment: ICU  Tobacco Use   Smoking status: Former    Passive exposure: Past   Smokeless tobacco: Never  Scientific laboratory technician Use: Never used  Substance and Sexual Activity   Alcohol use: No   Drug use: No   Sexual activity: Yes    Partners: Male    Birth control/protection: Post-menopausal    Comment: 1st intercourse- 10, partners- 5  Other Topics Concern   Not on file  Social History Narrative   ICU Therapist, occupational at [employer]. Married to [place]. Adult children.    Social Determinants of Health   Financial Resource Strain: Not on file  Food Insecurity: Not on file  Transportation Needs: Not on file  Physical Activity: Not on file  Stress: Not on file  Social Connections: Not on file   Family History  Problem Relation Age of Onset   Atrial fibrillation Mother    Pulmonary embolism Mother    Breast cancer Mother 26   Heart failure Sister    Breast cancer Maternal Grandmother    Hypertension Maternal Grandmother    Heart disease Maternal Grandfather    Stroke Paternal Grandfather    Colon cancer Neg Hx    Colon polyps Neg Hx    Allergies  Allergen Reactions   Crestor [Rosuvastatin]     Joint pain   Macrobid [Nitrofurantoin]    Pravastatin     Joint pain   Current Outpatient Medications  Medication Sig Dispense Refill   albuterol (VENTOLIN HFA) 108 (90 Base) MCG/ACT inhaler Inhale 2 puffs into the lungs every 6 (six) hours as needed for wheezing or shortness of breath (Cough). 6.7 g 0   aspirin EC 81 MG tablet Take 1 tablet (81 mg total) by mouth daily. Swallow whole. 90 tablet 3   Bempedoic Acid 180 MG TABS Take 1 tablet (180 mg) by mouth daily. 90 tablet 3   benazepril  (LOTENSIN) 20 MG tablet Take 1 tablet (20 mg total) by mouth daily. 90 tablet 3   carbidopa-levodopa (SINEMET) 25-250 MG tablet Take 1 tablet by mouth at bedtime. 30 tablet 11   clobetasol ointment (TEMOVATE) 0.05 % Apply as directed-- twice daily x 2 weeks-- then once daily x 2 weeks-- then twice weekly 30 g 2   DULoxetine HCl 40 MG CPEP Take 1 capsule by mouth daily. 90 capsule 3   esomeprazole (NEXIUM) 40 MG capsule TAKE 1 CAPSULE BY MOUTH ONCE A DAY 90 capsule 3   estradiol (ESTRACE VAGINAL) 0.1 MG/GM vaginal cream Place 1 Applicatorful vaginally nightly for 2 weeks, then twice weekly therafter 42.5 g 2   ezetimibe (ZETIA) 10 MG tablet Take 1 tablet by mouth daily. 90 tablet 3   furosemide (LASIX) 40 MG tablet TAKE 1 TABLET BY MOUTH ONCE A DAY 90 tablet 3   tirzepatide (MOUNJARO) 15 MG/0.5ML Pen Inject 15 mg into the skin once a week. 6 mL 6   traMADol (ULTRAM) 50 MG tablet Take 1 tablet (50 mg total) by mouth every 8 (eight) hours as needed for moderate pain or severe pain. 60 tablet 2   triamterene-hydrochlorothiazide (MAXZIDE-25) 37.5-25 MG tablet TAKE 1 TABLET BY MOUTH ONCE A DAY 90 tablet 3   TURMERIC PO Take by mouth.     VITAMIN D PO Take by mouth.     No current facility-administered medications for this visit.   No results found.  Review of Systems:   A ROS was performed including pertinent positives and negatives as documented in the HPI.  Physical Exam :   Constitutional: NAD and appears stated age Neurological: Alert and oriented Psych: Appropriate affect and cooperative There were no vitals taken for this visit.   Comprehensive Musculoskeletal Exam:      Musculoskeletal Exam  Gait Normal  Alignment Normal   Right Left  Inspection Normal Normal  Palpation    Tenderness Medial joint patellofemoral None  Crepitus Positive None  Effusion Trace None  Range of Motion    Extension 0 0  Flexion 135 135  Strength    Extension 5/5 5/5  Flexion 5/5 5/5  Ligament  Exam     Generalized Laxity No No  Lachman Negative Negative   Pivot Shift Negative Negative  Anterior Drawer Negative Negative  Valgus at 0 Negative Negative  Valgus at 20 Negative Negative  Varus at 0 0 0  Varus at 20   0 0  Posterior Drawer at 90 0 0  Vascular/Lymphatic Exam    Edema None None  Venous Stasis Changes No No  Distal Circulation Normal Normal  Neurologic    Light Touch Sensation Intact Intact  Special Tests:     Tenderness to palpation about the right ankle joint.  There is some laxity with a talar tilt as well as anterior drawer maneuver.  There is no peroneal subluxation.  There is swelling about the joint with passive dorsiflexion 20 degrees.  Plantarflexion is to 30 degrees without pain.  Imaging:   Xray (4 views left knee): There is mild medial joint space narrowing consistent with early osteoarthritis  Xray (3 view right ankle): Well-preserved ankle joint with areas of osteophytes and an old avulsion type injury about the medial malleolus  I personally reviewed and interpreted the radiographs.   Assessment:   57 y.o. female with right ankle recurrent instability and joint pain.  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  I did describe that I do believe it would be good to start with an ultrasound-guided injection of the right ankle and a series of strengthening exercises which I have showed her online.  She will work on the peroneal strengthening exercises in order to hopefully improve her stability.  I did discuss there Tvedt be a role for MRI in the future should she not get complete relief from these.  Plan :    -right ankle ultrasound-guided injection performed after verbal consent obtained     I personally saw and evaluated the patient, and participated in the management and treatment plan.  Vanetta Mulders, MD Attending Physician, Orthopedic Surgery  This document was  dictated using Dragon voice recognition software. A reasonable attempt at proof reading has been made to minimize errors.

## 2022-12-21 ENCOUNTER — Encounter: Payer: Self-pay | Admitting: Nurse Practitioner

## 2022-12-21 ENCOUNTER — Other Ambulatory Visit: Payer: Self-pay

## 2022-12-21 ENCOUNTER — Telehealth: Payer: Self-pay | Admitting: Nurse Practitioner

## 2022-12-21 ENCOUNTER — Other Ambulatory Visit (HOSPITAL_COMMUNITY): Payer: Self-pay

## 2022-12-21 DIAGNOSIS — Z6841 Body Mass Index (BMI) 40.0 and over, adult: Secondary | ICD-10-CM

## 2022-12-21 DIAGNOSIS — E118 Type 2 diabetes mellitus with unspecified complications: Secondary | ICD-10-CM

## 2022-12-21 DIAGNOSIS — E1169 Type 2 diabetes mellitus with other specified complication: Secondary | ICD-10-CM

## 2022-12-21 DIAGNOSIS — I152 Hypertension secondary to endocrine disorders: Secondary | ICD-10-CM

## 2022-12-21 MED ORDER — DULOXETINE HCL 20 MG PO CPEP: 20.0000 mg | ORAL_CAPSULE | Freq: Two times a day (BID) | ORAL | 2 refills | Status: DC

## 2022-12-21 MED ORDER — DULOXETINE HCL 20 MG PO CPEP
40.0000 mg | ORAL_CAPSULE | Freq: Every day | ORAL | 2 refills | Status: DC
  Filled 2022-12-21: qty 180, 90d supply, fill #0
  Filled 2023-03-16: qty 180, 90d supply, fill #1
  Filled 2023-04-07: qty 180, 90d supply, fill #2

## 2022-12-21 MED ORDER — FREESTYLE LIBRE 3 SENSOR MISC
11 refills | Status: DC
  Filled 2022-12-21: qty 2, 28d supply, fill #0
  Filled 2023-01-27: qty 2, 28d supply, fill #1
  Filled 2023-03-15: qty 2, 28d supply, fill #2
  Filled 2023-04-07: qty 2, 28d supply, fill #3
  Filled 2023-05-08: qty 2, 28d supply, fill #4
  Filled 2023-06-10: qty 2, 28d supply, fill #5
  Filled 2023-07-12: qty 2, 28d supply, fill #6
  Filled 2023-08-30: qty 2, 28d supply, fill #7
  Filled 2023-10-05: qty 2, 28d supply, fill #8
  Filled 2023-11-28 – 2023-12-15 (×2): qty 2, 28d supply, fill #9

## 2022-12-21 NOTE — Telephone Encounter (Signed)
Pharmacy called & states insurance will no longer pay for the '40mg'$  but will pay for 2 of the '20mg'$  duloxetine,  everything else the same, still DR,  I gave ok to switch.  '20mg'$  #180 2 a day

## 2022-12-22 ENCOUNTER — Other Ambulatory Visit (HOSPITAL_COMMUNITY): Payer: Self-pay

## 2022-12-22 ENCOUNTER — Other Ambulatory Visit: Payer: Self-pay

## 2022-12-23 ENCOUNTER — Other Ambulatory Visit: Payer: Self-pay

## 2023-01-08 ENCOUNTER — Other Ambulatory Visit (HOSPITAL_COMMUNITY): Payer: Self-pay

## 2023-01-08 ENCOUNTER — Encounter: Payer: Self-pay | Admitting: Nurse Practitioner

## 2023-01-08 DIAGNOSIS — M109 Gout, unspecified: Secondary | ICD-10-CM

## 2023-01-08 DIAGNOSIS — R110 Nausea: Secondary | ICD-10-CM

## 2023-01-08 MED ORDER — PREDNISONE 10 MG PO TABS
10.0000 mg | ORAL_TABLET | Freq: Every day | ORAL | 3 refills | Status: DC
  Filled 2023-01-08: qty 30, 30d supply, fill #0
  Filled 2023-03-16: qty 30, 30d supply, fill #1

## 2023-01-08 MED ORDER — ONDANSETRON HCL 8 MG PO TABS
8.0000 mg | ORAL_TABLET | Freq: Three times a day (TID) | ORAL | 3 refills | Status: AC | PRN
  Filled 2023-01-08: qty 30, 10d supply, fill #0
  Filled 2023-06-21: qty 30, 10d supply, fill #1
  Filled 2023-10-05: qty 30, 10d supply, fill #2

## 2023-01-27 ENCOUNTER — Other Ambulatory Visit (HOSPITAL_BASED_OUTPATIENT_CLINIC_OR_DEPARTMENT_OTHER): Payer: Self-pay | Admitting: Orthopaedic Surgery

## 2023-01-27 ENCOUNTER — Encounter (HOSPITAL_BASED_OUTPATIENT_CLINIC_OR_DEPARTMENT_OTHER): Payer: Self-pay | Admitting: Orthopaedic Surgery

## 2023-01-27 DIAGNOSIS — M25371 Other instability, right ankle: Secondary | ICD-10-CM

## 2023-02-06 ENCOUNTER — Other Ambulatory Visit (HOSPITAL_COMMUNITY): Payer: Self-pay

## 2023-02-08 ENCOUNTER — Other Ambulatory Visit: Payer: Self-pay

## 2023-02-09 ENCOUNTER — Other Ambulatory Visit (HOSPITAL_COMMUNITY): Payer: Self-pay

## 2023-02-09 ENCOUNTER — Other Ambulatory Visit: Payer: Self-pay

## 2023-02-17 ENCOUNTER — Other Ambulatory Visit (HOSPITAL_COMMUNITY): Payer: Self-pay

## 2023-02-17 ENCOUNTER — Ambulatory Visit
Admission: RE | Admit: 2023-02-17 | Discharge: 2023-02-17 | Disposition: A | Discharge status: Home / Self Care | Payer: Commercial Managed Care - PPO | Source: Ambulatory Visit | Attending: Orthopaedic Surgery | Admitting: Orthopaedic Surgery

## 2023-02-17 DIAGNOSIS — R600 Localized edema: Secondary | ICD-10-CM | POA: Diagnosis not present

## 2023-02-17 DIAGNOSIS — M25371 Other instability, right ankle: Secondary | ICD-10-CM

## 2023-02-21 IMAGING — DX DG KNEE COMPLETE 4+V*L*
4 series · 4 of 4 positions shown · non-contrast
Comparison: None Available.

CLINICAL DATA: Chronic left knee pain.

EXAM:
LEFT KNEE - COMPLETE 4+ VIEW

[knee ap]
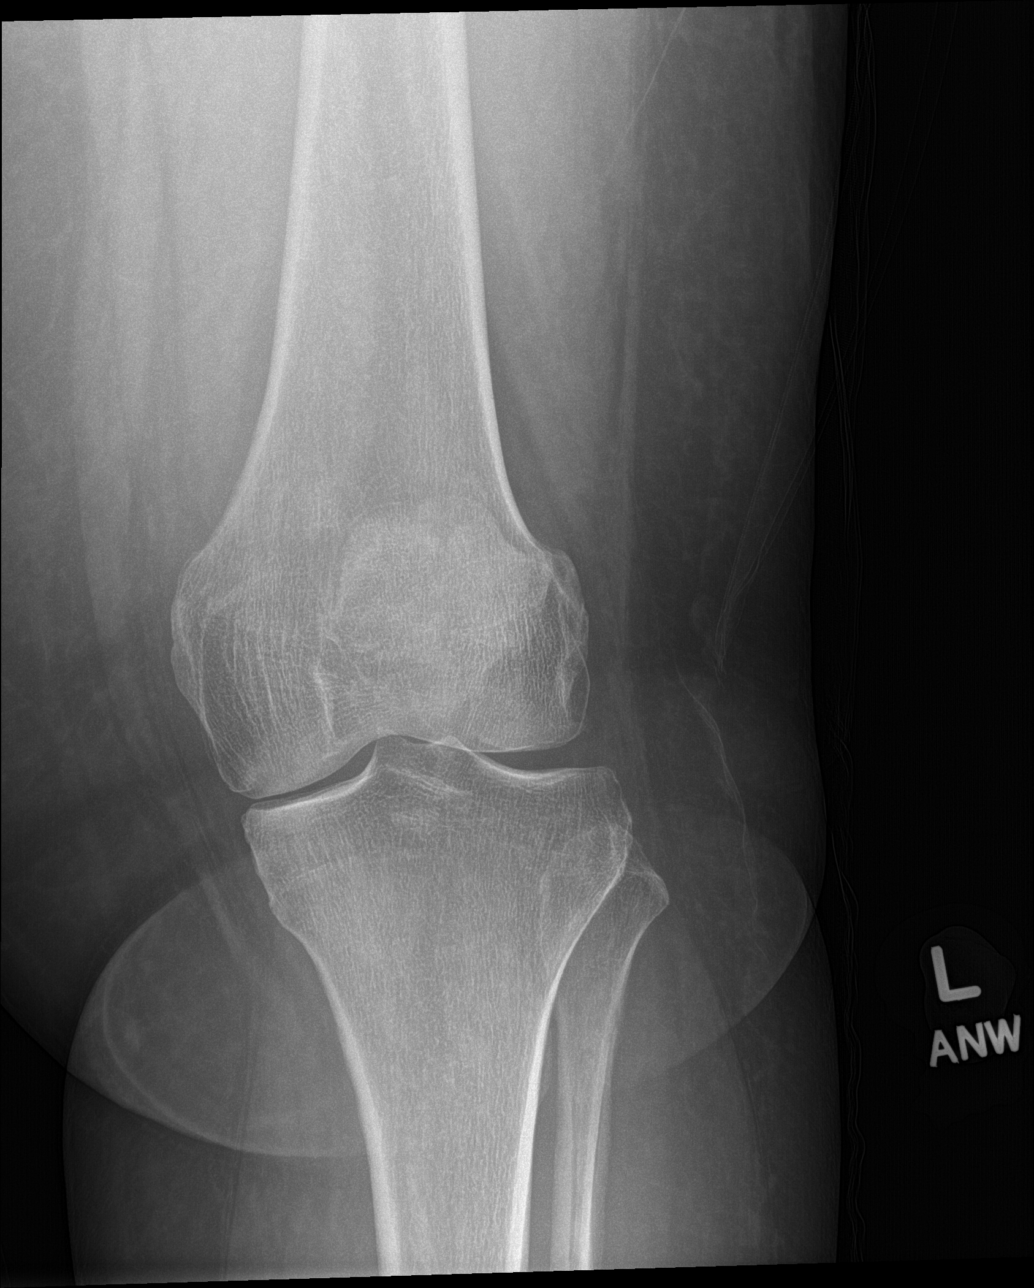

[knee tunnel]
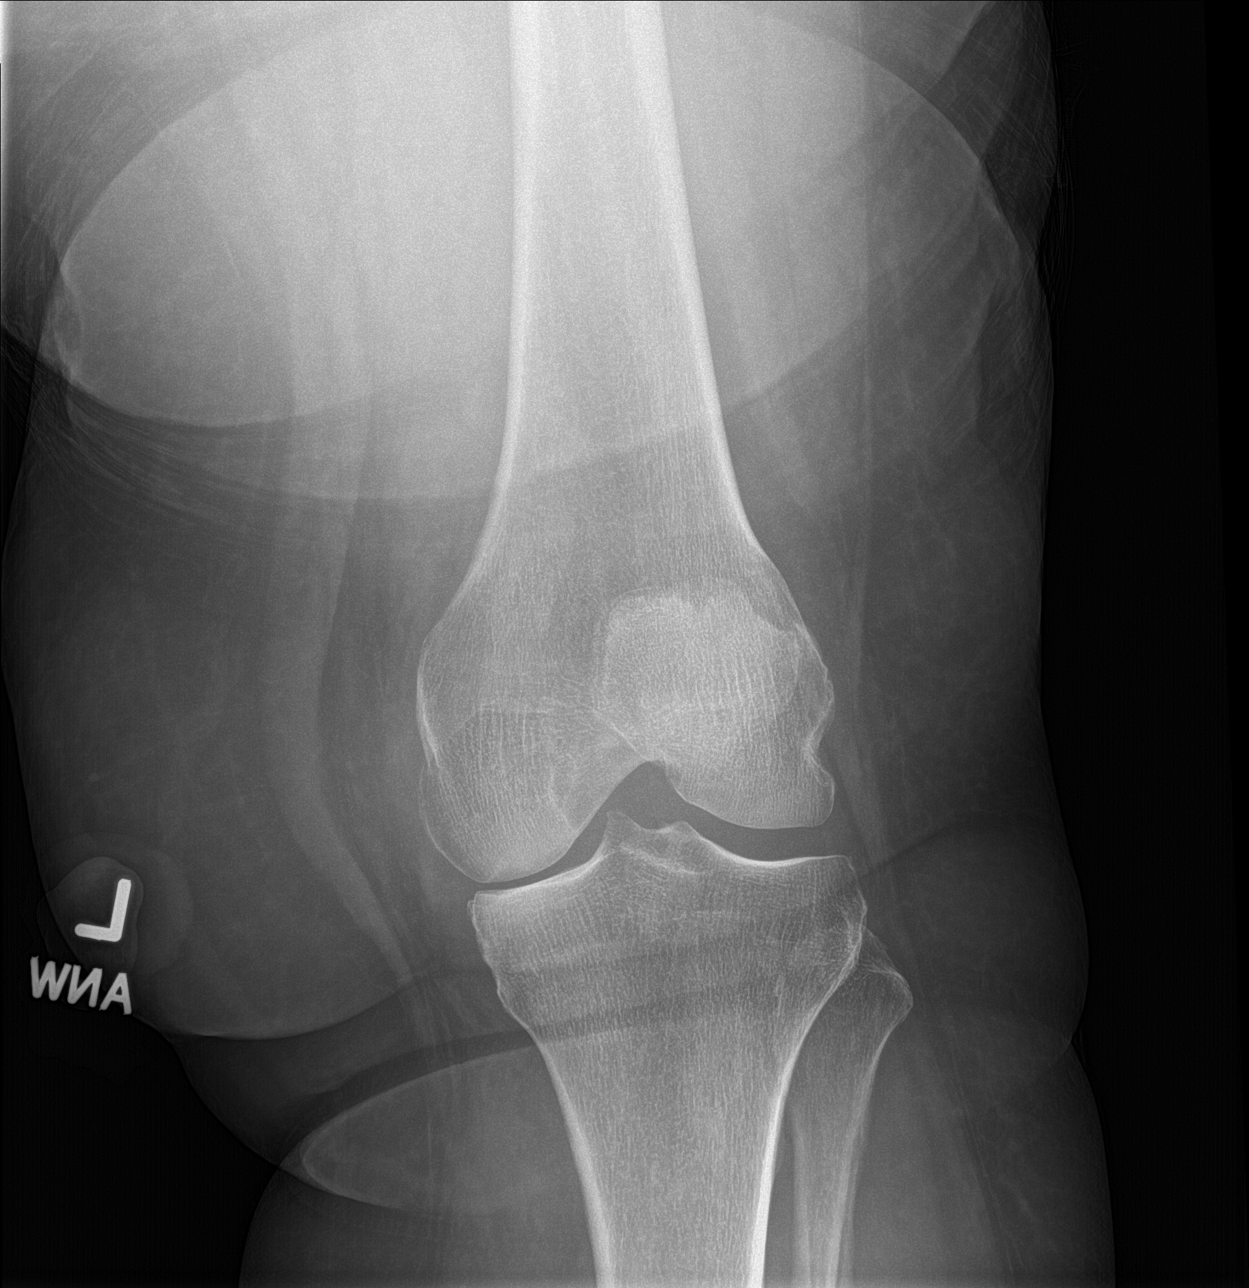

[patella sunrise]
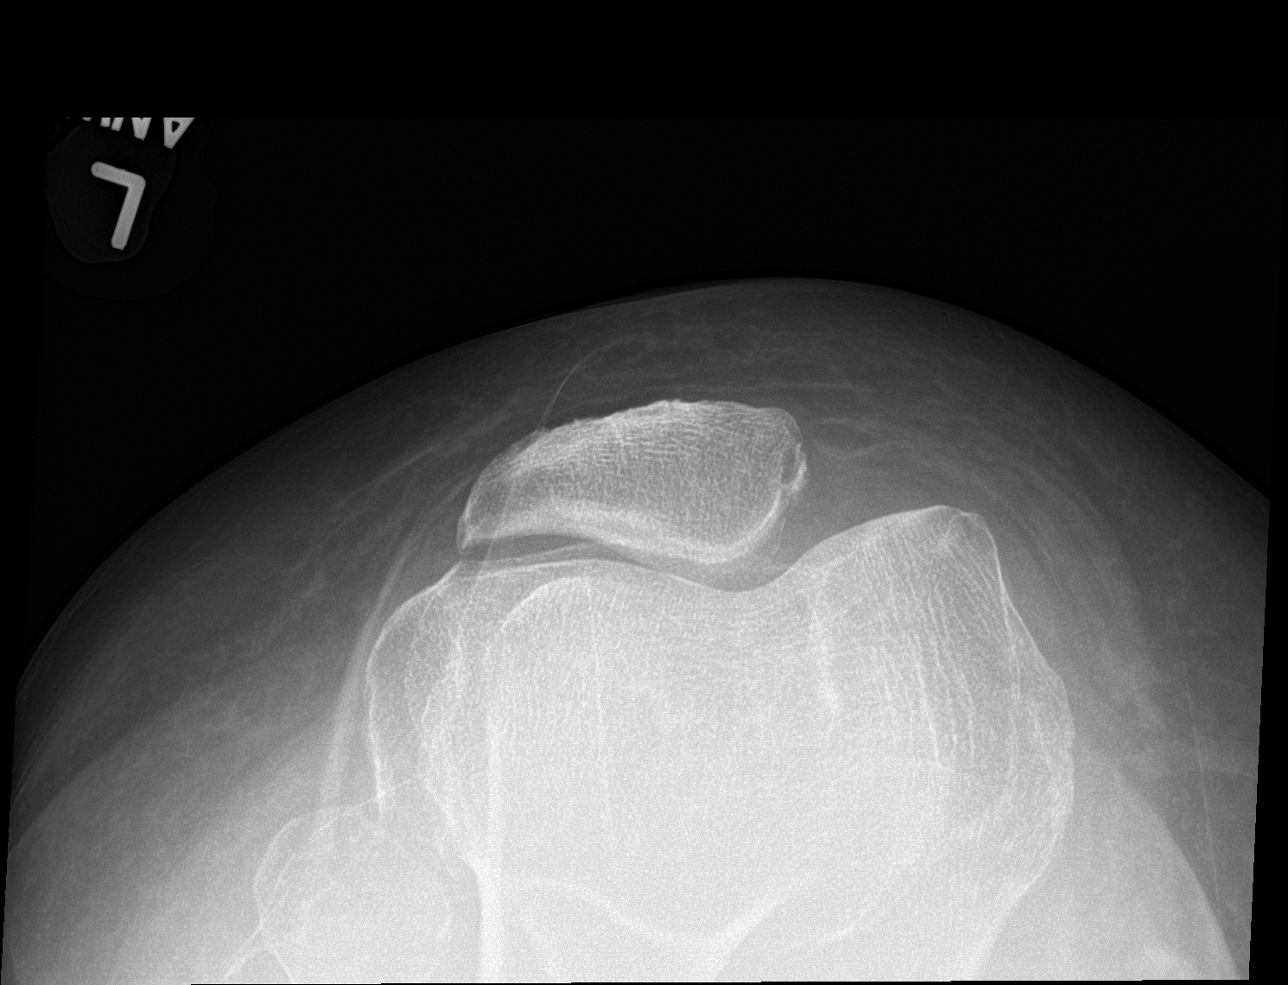

[knee lat]
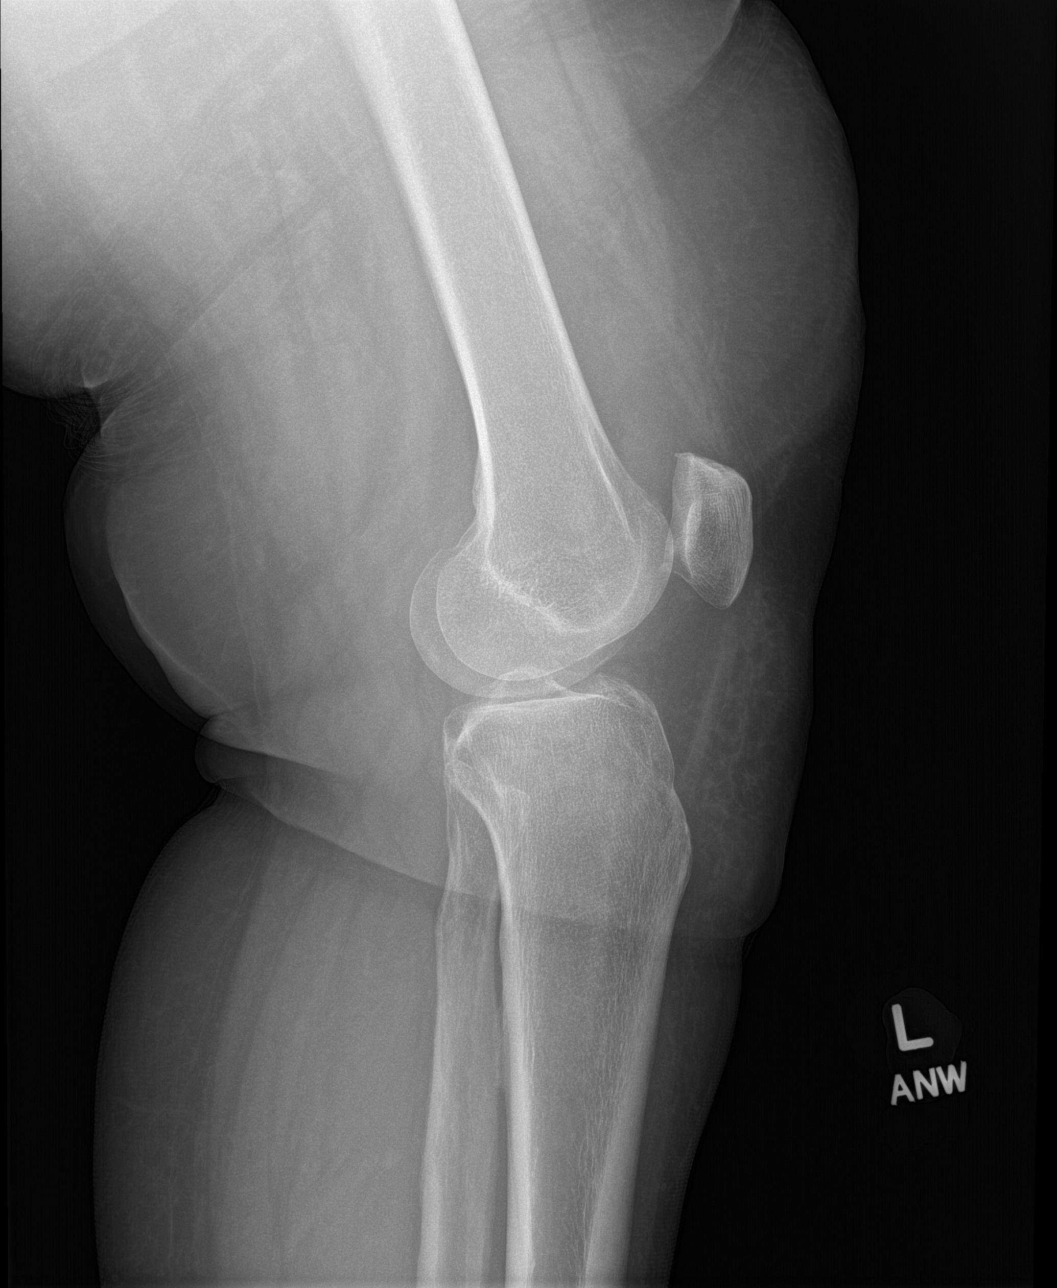

[4 of 4 positions shown; findings below may reference images not displayed]

FINDINGS: Small joint effusion is likely present. Soft tissues are otherwise
within normal limits. There is no acute fracture or dislocation.
There is mild medial compartment joint space narrowing. No focal
osseous lesion.
IMPRESSION: 1. No acute fracture or dislocation.
2. Small joint effusion.
3. Mild degenerative changes of the medial compartment.

## 2023-02-22 ENCOUNTER — Encounter: Payer: Self-pay | Admitting: Nurse Practitioner

## 2023-02-22 ENCOUNTER — Other Ambulatory Visit (HOSPITAL_COMMUNITY): Payer: Self-pay

## 2023-02-22 DIAGNOSIS — E1169 Type 2 diabetes mellitus with other specified complication: Secondary | ICD-10-CM

## 2023-02-22 DIAGNOSIS — E118 Type 2 diabetes mellitus with unspecified complications: Secondary | ICD-10-CM

## 2023-02-22 DIAGNOSIS — I700 Atherosclerosis of aorta: Secondary | ICD-10-CM

## 2023-02-22 DIAGNOSIS — E1159 Type 2 diabetes mellitus with other circulatory complications: Secondary | ICD-10-CM

## 2023-02-22 DIAGNOSIS — Z6841 Body Mass Index (BMI) 40.0 and over, adult: Secondary | ICD-10-CM

## 2023-02-23 MED ORDER — RYBELSUS 14 MG PO TABS
14.0000 mg | ORAL_TABLET | Freq: Every day | ORAL | 1 refills | Status: DC
  Filled 2023-02-23: qty 30, 30d supply, fill #0
  Filled 2023-03-16 – 2023-04-13 (×3): qty 30, 30d supply, fill #1

## 2023-02-24 ENCOUNTER — Other Ambulatory Visit (HOSPITAL_COMMUNITY): Payer: Self-pay

## 2023-02-25 ENCOUNTER — Other Ambulatory Visit (HOSPITAL_COMMUNITY): Payer: Self-pay

## 2023-03-01 ENCOUNTER — Encounter (HOSPITAL_BASED_OUTPATIENT_CLINIC_OR_DEPARTMENT_OTHER): Payer: Self-pay | Admitting: Orthopaedic Surgery

## 2023-03-15 ENCOUNTER — Other Ambulatory Visit: Payer: Self-pay

## 2023-03-15 ENCOUNTER — Other Ambulatory Visit (HOSPITAL_COMMUNITY): Payer: Self-pay

## 2023-03-16 ENCOUNTER — Other Ambulatory Visit: Payer: Self-pay

## 2023-03-16 ENCOUNTER — Other Ambulatory Visit (HOSPITAL_COMMUNITY): Payer: Self-pay

## 2023-03-17 ENCOUNTER — Ambulatory Visit (HOSPITAL_BASED_OUTPATIENT_CLINIC_OR_DEPARTMENT_OTHER): Payer: Commercial Managed Care - PPO | Admitting: Orthopaedic Surgery

## 2023-03-29 ENCOUNTER — Other Ambulatory Visit (HOSPITAL_COMMUNITY): Payer: Self-pay

## 2023-04-07 ENCOUNTER — Other Ambulatory Visit (HOSPITAL_COMMUNITY): Payer: Self-pay

## 2023-04-07 ENCOUNTER — Other Ambulatory Visit (HOSPITAL_BASED_OUTPATIENT_CLINIC_OR_DEPARTMENT_OTHER): Payer: Self-pay

## 2023-04-07 ENCOUNTER — Ambulatory Visit (HOSPITAL_BASED_OUTPATIENT_CLINIC_OR_DEPARTMENT_OTHER): Payer: Self-pay | Admitting: Orthopaedic Surgery

## 2023-04-07 ENCOUNTER — Ambulatory Visit (HOSPITAL_BASED_OUTPATIENT_CLINIC_OR_DEPARTMENT_OTHER): Payer: Commercial Managed Care - PPO | Admitting: Orthopaedic Surgery

## 2023-04-07 DIAGNOSIS — M25371 Other instability, right ankle: Secondary | ICD-10-CM

## 2023-04-07 MED ORDER — ASPIRIN 325 MG PO TBEC
325.0000 mg | DELAYED_RELEASE_TABLET | Freq: Every day | ORAL | 0 refills | Status: DC
  Filled 2023-04-07: qty 14, 14d supply, fill #0

## 2023-04-07 MED ORDER — ACETAMINOPHEN 500 MG PO TABS
500.0000 mg | ORAL_TABLET | Freq: Three times a day (TID) | ORAL | 0 refills | Status: AC
  Filled 2023-04-07: qty 30, 10d supply, fill #0

## 2023-04-07 MED ORDER — OXYCODONE HCL 5 MG PO TABS
5.0000 mg | ORAL_TABLET | ORAL | 0 refills | Status: DC | PRN
  Filled 2023-04-07: qty 20, 4d supply, fill #0

## 2023-04-07 MED ORDER — IBUPROFEN 800 MG PO TABS
800.0000 mg | ORAL_TABLET | Freq: Three times a day (TID) | ORAL | 0 refills | Status: AC
  Filled 2023-04-07: qty 30, 10d supply, fill #0

## 2023-04-07 NOTE — Progress Notes (Signed)
Chief Complaint: Right ankle pain     History of Present Illness:   04/07/2023: Presents today for follow-up of her right ankle.  For MRI discussion.  She is having persistent instability and pain predominantly about the lateral aspect of the joint.  This is continuing to give out on her frequently.  Michelle Ray is a 57 y.o. female presents today with left knee pain which has been ongoing for 1-1/2 years.  She experiences occasional crepitus and popping in the medial aspect of the knee.  She does take Tylenol and ibuprofen as needed.  She does feel that the knee will buckle occasionally give out.  She experiences the pain predominantly about the medial foot as well as the posterior aspect of the knee.  She works as an Insurance underwriter at [employer].  No history of prior surgery.  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)    Constipation    uses OTC stool softener prn only- this is not chronic   Diabetes mellitus without complication (HCC)    GERD (gastroesophageal reflux disease)    Hyperlipemia    Hyperlipidemia    Hypertension    Past Surgical History:  Procedure Laterality Date   ABLATION     ENDOVENOUS ABLATION SAPHENOUS VEIN W/ LASER Right 01/28/2022   endovenous laser ablation right greater saphenous vein and stab phlebectomy 10-20 incisions right leg by Cari Caraway MD   TUBAL LIGATION     uterine fibroid removal     WISDOM TOOTH EXTRACTION     Social History   Socioeconomic History   Marital status: Married    Spouse name: Minerva Areola   Number of children: Not on file   Years of education: Not on file   Highest education level: Master's degree (e.g., MA, MS, MEng, MEd, MSW, MBA)   Occupational History   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 Adult children.    Social Determinants of Health   Financial Resource Strain: Not on file  Food Insecurity: Not on file  Transportation Needs: Not on file  Physical Activity: Not on file  Stress: Not on file  Social Connections: Not on file   Family History  Problem Relation Age of Onset   Atrial fibrillation Mother    Pulmonary embolism Mother    Breast cancer Mother 73   Heart failure Sister    Breast cancer Maternal Grandmother    Hypertension Maternal Grandmother    Heart disease Maternal Grandfather  Stroke Paternal Grandfather    Colon cancer Neg Hx    Colon polyps Neg Hx    Allergies  Allergen Reactions   Crestor [Rosuvastatin]     Joint pain   Macrobid [Nitrofurantoin]    Pravastatin     Joint pain   Current Outpatient Medications  Medication Sig Dispense Refill   acetaminophen (TYLENOL) 500 MG tablet Take 1 tablet (500 mg total) by mouth every 8 (eight) hours for 10 days. 30 tablet 0   aspirin EC 325 MG tablet Take 1 tablet (325 mg total) by mouth daily. 14 tablet 0   ibuprofen (ADVIL) 800 MG tablet Take 1 tablet (800 mg total) by mouth every 8 (eight) hours for 10 days. Please take with food, please alternate with acetaminophen 30 tablet 0   oxyCODONE (OXY IR/ROXICODONE) 5 MG immediate release tablet Take 1 tablet (5 mg total) by mouth every 4 (four) hours as needed (severe pain). 20 tablet 0   albuterol (VENTOLIN HFA) 108 (90 Base)  MCG/ACT inhaler Inhale 2 puffs into the lungs every 6 (six) hours as needed for wheezing or shortness of breath (Cough). 6.7 g 0   aspirin EC 81 MG tablet Take 1 tablet (81 mg total) by mouth daily. Swallow whole. 90 tablet 3   Bempedoic Acid 180 MG TABS Take 1 tablet (180 mg) by mouth daily. 90 tablet 3   benazepril (LOTENSIN) 20 MG tablet Take 1 tablet (20 mg total) by mouth daily. 90 tablet 3   clobetasol ointment (TEMOVATE) 0.05 % Apply as directed-- twice daily x 2 weeks-- then once daily x 2 weeks-- then twice weekly 30 g 2   Continuous Glucose Sensor (FREESTYLE LIBRE 3 SENSOR) MISC Place 1 sensor on the skin every 14 days. Use to check glucose continuously 2 each 11   DULoxetine (CYMBALTA) 20 MG capsule Take 1 capsule (20 mg total) by mouth 2 (two) times daily. 180 capsule 2   DULoxetine (CYMBALTA) 20 MG capsule Take 2 capsules (40 mg total) by mouth daily. 180 capsule 2   esomeprazole (NEXIUM) 40 MG capsule TAKE 1 CAPSULE BY MOUTH ONCE A DAY 90 capsule 3   estradiol (ESTRACE VAGINAL) 0.1 MG/GM vaginal cream Place 1 Applicatorful vaginally nightly for 2 weeks, then twice weekly therafter 42.5 g 2   ezetimibe (ZETIA) 10 MG tablet Take 1 tablet by mouth daily. 90 tablet 3   furosemide (LASIX) 40 MG tablet TAKE 1 TABLET BY MOUTH ONCE A DAY 90 tablet 3   ondansetron (ZOFRAN) 8 MG tablet Take 1 tablet (8 mg total) by mouth every 8 (eight) hours as needed for nausea or vomiting. 30 tablet 3   predniSONE (DELTASONE) 10 MG tablet Take 1 tablet (10 mg total) by mouth daily with breakfast. Start at first sign of gout flair and continue for at least 2 days following resolution of symptoms. If no response in 5 days, please let provider know. 30 tablet 3   Semaglutide (RYBELSUS) 14 MG TABS Take 1 tablet (14 mg total) by mouth daily. 90 tablet 1   tirzepatide (MOUNJARO) 15 MG/0.5ML Pen Inject 15 mg into the skin once a week. 6 mL 6   traMADol (ULTRAM) 50 MG tablet Take 1 tablet (50 mg total) by mouth every  8 (eight) hours as needed for moderate pain or severe pain. 60 tablet 2   triamterene-hydrochlorothiazide (MAXZIDE-25) 37.5-25 MG tablet TAKE 1 TABLET BY MOUTH ONCE A DAY 90 tablet 3   TURMERIC PO Take by mouth.  VITAMIN D PO Take by mouth.     No current facility-administered medications for this visit.   No results found.  Review of Systems:   A ROS was performed including pertinent positives and negatives as documented in the HPI.  Physical Exam :   Constitutional: NAD and appears stated age Neurological: Alert and oriented Psych: Appropriate affect and cooperative There were no vitals taken for this visit.   Comprehensive Musculoskeletal Exam:      Musculoskeletal Exam  Gait Normal  Alignment Normal   Right Left  Inspection Normal Normal  Palpation    Tenderness Medial joint patellofemoral None  Crepitus Positive None  Effusion Trace None  Range of Motion    Extension 0 0  Flexion 135 135  Strength    Extension 5/5 5/5  Flexion 5/5 5/5  Ligament Exam     Generalized Laxity No No  Lachman Negative Negative   Pivot Shift Negative Negative  Anterior Drawer Negative Negative  Valgus at 0 Negative Negative  Valgus at 20 Negative Negative  Varus at 0 0 0  Varus at 20   0 0  Posterior Drawer at 90 0 0  Vascular/Lymphatic Exam    Edema None None  Venous Stasis Changes No No  Distal Circulation Normal Normal  Neurologic    Light Touch Sensation Intact Intact  Special Tests:     Tenderness to palpation about the right ankle joint.  There is some laxity with a talar tilt as well as anterior drawer maneuver.  There is no peroneal subluxation.  There is swelling about the joint with passive dorsiflexion 20 degrees.  Plantarflexion is to 30 degrees without pain.  Imaging:   Xray (4 views left knee): There is mild medial joint space narrowing consistent with early osteoarthritis  Xray (3 view right ankle): Well-preserved ankle joint with areas of osteophytes and  an old avulsion type injury about the medial malleolus  I personally reviewed and interpreted the radiographs.   Assessment:   57 y.o. female with right ankle recurrent instability and joint pain.  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  She did get nearly 1 month of relief from the injection but is now having persistent instability and pain.  Given that we did discuss possible alternatives and surgical discussion.  We did discuss the limitations and restrictions associated with Brostrm repair and ankle arthroscopy.  After long discussion she has elected for this. Plan :    -Plan for right ankle arthroscopy with Brostrm repair    After a lengthy discussion of treatment options, including risks, benefits, alternatives, complications of surgical and nonsurgical conservative options, the patient elected surgical repair.   The patient  is aware of the material risks  and complications including, but not limited to injury to adjacent structures, neurovascular injury, infection, numbness, bleeding, implant failure, thermal burns, stiffness, persistent pain, failure to heal, disease transmission from allograft, need for further surgery, dislocation, anesthetic risks, blood clots, risks of death,and others. The probabilities of surgical success and failure discussed with patient given their particular co-morbidities.The time and nature of expected rehabilitation and recovery was discussed.The patient's questions were all answered preoperatively.  No barriers to understanding were noted. I explained the natural history of the disease process and Rx rationale.  I explained to the patient what I considered to be reasonable expectations given their personal situation.  The final treatment plan was arrived at through a shared patient decision  making process model.   I personally saw and evaluated the patient, and participated in  the management and treatment plan.  Huel Cote, MD Attending Physician, Orthopedic Surgery  This document was dictated using Dragon voice recognition software. A reasonable attempt at proof reading has been made to minimize errors.

## 2023-04-12 ENCOUNTER — Other Ambulatory Visit (HOSPITAL_COMMUNITY): Payer: Self-pay

## 2023-04-13 ENCOUNTER — Other Ambulatory Visit (HOSPITAL_COMMUNITY): Payer: Self-pay

## 2023-04-18 ENCOUNTER — Encounter: Payer: Self-pay | Admitting: Nurse Practitioner

## 2023-04-18 DIAGNOSIS — E1169 Type 2 diabetes mellitus with other specified complication: Secondary | ICD-10-CM

## 2023-04-18 DIAGNOSIS — I700 Atherosclerosis of aorta: Secondary | ICD-10-CM

## 2023-04-18 DIAGNOSIS — E1159 Type 2 diabetes mellitus with other circulatory complications: Secondary | ICD-10-CM

## 2023-04-18 DIAGNOSIS — E118 Type 2 diabetes mellitus with unspecified complications: Secondary | ICD-10-CM

## 2023-04-18 DIAGNOSIS — Z6841 Body Mass Index (BMI) 40.0 and over, adult: Secondary | ICD-10-CM

## 2023-04-19 ENCOUNTER — Other Ambulatory Visit (HOSPITAL_COMMUNITY): Payer: Self-pay

## 2023-04-21 ENCOUNTER — Other Ambulatory Visit (HOSPITAL_COMMUNITY): Payer: Self-pay

## 2023-04-21 MED ORDER — SEMAGLUTIDE (2 MG/DOSE) 8 MG/3ML ~~LOC~~ SOPN
2.0000 mg | PEN_INJECTOR | SUBCUTANEOUS | 3 refills | Status: DC
Start: 2023-04-21 — End: 2023-07-20
  Filled 2023-04-21 – 2023-05-23 (×2): qty 3, 28d supply, fill #0
  Filled 2023-06-10 – 2023-06-21 (×3): qty 3, 28d supply, fill #1
  Filled 2023-07-18: qty 3, 28d supply, fill #2

## 2023-04-21 MED ORDER — SEMAGLUTIDE (1 MG/DOSE) 4 MG/3ML ~~LOC~~ SOPN
1.0000 mg | PEN_INJECTOR | SUBCUTANEOUS | 1 refills | Status: DC
Start: 2023-04-21 — End: 2023-04-21
  Filled 2023-04-21: qty 3, 28d supply, fill #0

## 2023-04-21 NOTE — Addendum Note (Signed)
Addended by: Konner Saiz, Huntley Dec E on: 04/21/2023 07:20 PM   Modules accepted: Orders

## 2023-04-22 ENCOUNTER — Other Ambulatory Visit (HOSPITAL_COMMUNITY): Payer: Self-pay

## 2023-04-29 ENCOUNTER — Encounter: Payer: Self-pay | Admitting: Nurse Practitioner

## 2023-04-29 ENCOUNTER — Encounter: Payer: Self-pay | Admitting: Cardiology

## 2023-05-10 ENCOUNTER — Other Ambulatory Visit (HOSPITAL_COMMUNITY): Payer: Self-pay

## 2023-05-12 ENCOUNTER — Other Ambulatory Visit (HOSPITAL_COMMUNITY): Payer: Self-pay

## 2023-05-18 ENCOUNTER — Encounter (HOSPITAL_BASED_OUTPATIENT_CLINIC_OR_DEPARTMENT_OTHER): Payer: Self-pay | Admitting: Orthopaedic Surgery

## 2023-05-18 ENCOUNTER — Other Ambulatory Visit: Payer: Self-pay

## 2023-05-19 ENCOUNTER — Encounter (HOSPITAL_BASED_OUTPATIENT_CLINIC_OR_DEPARTMENT_OTHER)
Admission: RE | Admit: 2023-05-19 | Discharge: 2023-05-19 | Disposition: A | Discharge status: Home / Self Care | Payer: Commercial Managed Care - PPO | Source: Ambulatory Visit | Attending: Orthopaedic Surgery | Admitting: Orthopaedic Surgery

## 2023-05-19 DIAGNOSIS — Z01818 Encounter for other preprocedural examination: Secondary | ICD-10-CM | POA: Diagnosis not present

## 2023-05-19 LAB — BASIC METABOLIC PANEL
Anion gap: 9 (ref 5–15)
BUN: 16 mg/dL (ref 6–20)
CO2: 25 mmol/L (ref 22–32)
Calcium: 9.1 mg/dL (ref 8.9–10.3)
Chloride: 105 mmol/L (ref 98–111)
Creatinine, Ser: 0.94 mg/dL (ref 0.44–1.00)
GFR, Estimated: >60 mL/min (ref >60)
Glucose, Bld: 86 mg/dL (ref 70–99)
Potassium: 3.5 mmol/L (ref 3.5–5.1)
Sodium: 139 mmol/L (ref 135–145)

## 2023-05-19 NOTE — Progress Notes (Signed)

## 2023-05-21 ENCOUNTER — Other Ambulatory Visit: Payer: Self-pay | Admitting: Nurse Practitioner

## 2023-05-21 DIAGNOSIS — M797 Fibromyalgia: Secondary | ICD-10-CM

## 2023-05-21 NOTE — Telephone Encounter (Signed)
Refill request last apt 10/19/22 next apt 05/25/23.

## 2023-05-22 MED ORDER — TRAMADOL HCL 50 MG PO TABS
50.0000 mg | ORAL_TABLET | Freq: Three times a day (TID) | ORAL | 2 refills | Status: DC | PRN
Start: 2023-05-22 — End: 2023-12-15
  Filled 2023-05-22: qty 60, 20d supply, fill #0

## 2023-05-24 ENCOUNTER — Other Ambulatory Visit: Payer: Self-pay

## 2023-05-24 ENCOUNTER — Ambulatory Visit: Payer: Commercial Managed Care - PPO | Admitting: Nurse Practitioner

## 2023-05-24 ENCOUNTER — Other Ambulatory Visit (HOSPITAL_COMMUNITY): Payer: Self-pay

## 2023-05-24 ENCOUNTER — Encounter: Payer: Self-pay | Admitting: Nurse Practitioner

## 2023-05-24 VITALS — BP 124/82 | HR 70 | Wt 204.2 lb

## 2023-05-24 DIAGNOSIS — I700 Atherosclerosis of aorta: Secondary | ICD-10-CM | POA: Diagnosis not present

## 2023-05-24 DIAGNOSIS — M25371 Other instability, right ankle: Secondary | ICD-10-CM

## 2023-05-24 DIAGNOSIS — M797 Fibromyalgia: Secondary | ICD-10-CM

## 2023-05-24 DIAGNOSIS — Z6841 Body Mass Index (BMI) 40.0 and over, adult: Secondary | ICD-10-CM | POA: Diagnosis not present

## 2023-05-24 DIAGNOSIS — N952 Postmenopausal atrophic vaginitis: Secondary | ICD-10-CM

## 2023-05-24 DIAGNOSIS — K219 Gastro-esophageal reflux disease without esophagitis: Secondary | ICD-10-CM

## 2023-05-24 DIAGNOSIS — I152 Hypertension secondary to endocrine disorders: Secondary | ICD-10-CM | POA: Diagnosis not present

## 2023-05-24 DIAGNOSIS — E1169 Type 2 diabetes mellitus with other specified complication: Secondary | ICD-10-CM

## 2023-05-24 DIAGNOSIS — E785 Hyperlipidemia, unspecified: Secondary | ICD-10-CM | POA: Diagnosis not present

## 2023-05-24 DIAGNOSIS — E118 Type 2 diabetes mellitus with unspecified complications: Secondary | ICD-10-CM

## 2023-05-24 DIAGNOSIS — E1159 Type 2 diabetes mellitus with other circulatory complications: Secondary | ICD-10-CM

## 2023-05-24 DIAGNOSIS — R4189 Other symptoms and signs involving cognitive functions and awareness: Secondary | ICD-10-CM

## 2023-05-24 DIAGNOSIS — R600 Localized edema: Secondary | ICD-10-CM

## 2023-05-24 LAB — CMP14+EGFR
ALT: 19 IU/L (ref 0–32)
Bilirubin Total: 0.4 mg/dL (ref 0.0–1.2)
CO2: 21 mmol/L (ref 20–29)
Calcium: 10 mg/dL (ref 8.7–10.2)
Potassium: 4.3 mmol/L (ref 3.5–5.2)
eGFR: 50 mL/min/{1.73_m2} — ABNORMAL LOW (ref >59)

## 2023-05-24 LAB — CBC WITH DIFFERENTIAL/PLATELET

## 2023-05-24 LAB — LIPID PANEL: Triglycerides: 307 mg/dL — ABNORMAL HIGH (ref 0–149)

## 2023-05-24 LAB — HEMOGLOBIN A1C

## 2023-05-24 MED ORDER — ASPIRIN 81 MG PO TBEC
81.0000 mg | DELAYED_RELEASE_TABLET | Freq: Every day | ORAL | 3 refills | Status: DC
Start: 2023-05-24 — End: 2023-05-25

## 2023-05-24 MED ORDER — DULOXETINE HCL 20 MG PO CPEP
40.0000 mg | ORAL_CAPSULE | Freq: Every day | ORAL | 3 refills | Status: DC
  Filled 2023-05-24 – 2023-06-21 (×2): qty 180, 90d supply, fill #0
  Filled 2023-10-05: qty 180, 90d supply, fill #1
  Filled 2024-01-15: qty 180, 90d supply, fill #2
  Filled 2024-04-13: qty 180, 90d supply, fill #3

## 2023-05-24 MED ORDER — BENAZEPRIL HCL 20 MG PO TABS
20.0000 mg | ORAL_TABLET | Freq: Every day | ORAL | 3 refills | Status: DC
Start: 2023-05-24 — End: 2024-07-18
  Filled 2023-05-24: qty 90, 90d supply, fill #0
  Filled 2023-08-30: qty 90, 90d supply, fill #1
  Filled 2023-11-28 – 2023-12-29 (×2): qty 90, 90d supply, fill #2
  Filled 2024-04-13: qty 90, 90d supply, fill #3

## 2023-05-24 MED ORDER — ESOMEPRAZOLE MAGNESIUM 40 MG PO CPDR
40.0000 mg | DELAYED_RELEASE_CAPSULE | Freq: Every day | ORAL | 3 refills | Status: DC
Start: 2023-05-24 — End: 2024-07-18
  Filled 2023-05-24 – 2023-06-12 (×2): qty 90, 90d supply, fill #0
  Filled 2023-09-16: qty 90, 90d supply, fill #1
  Filled 2023-12-29: qty 90, 90d supply, fill #2
  Filled 2024-04-13: qty 90, 90d supply, fill #3

## 2023-05-24 MED ORDER — TRIAMTERENE-HCTZ 37.5-25 MG PO TABS
1.0000 | ORAL_TABLET | Freq: Every day | ORAL | 3 refills | Status: DC
Start: 2023-05-24 — End: 2023-11-09
  Filled 2023-05-24 – 2023-06-21 (×2): qty 90, 90d supply, fill #0
  Filled 2023-10-05: qty 90, 90d supply, fill #1

## 2023-05-24 MED ORDER — FUROSEMIDE 40 MG PO TABS
40.0000 mg | ORAL_TABLET | Freq: Every day | ORAL | 3 refills | Status: DC
  Filled 2023-05-24 – 2023-06-21 (×2): qty 90, 90d supply, fill #0
  Filled 2023-10-05: qty 90, 90d supply, fill #1

## 2023-05-24 MED ORDER — EZETIMIBE 10 MG PO TABS
10.0000 mg | ORAL_TABLET | Freq: Every day | ORAL | 3 refills | Status: DC
Start: 2023-05-24 — End: 2024-07-03
  Filled 2023-05-24: qty 90, 90d supply, fill #0
  Filled 2023-08-30: qty 90, 90d supply, fill #1
  Filled 2023-12-15: qty 90, 90d supply, fill #2
  Filled 2024-03-15: qty 90, 90d supply, fill #3

## 2023-05-24 NOTE — Progress Notes (Signed)
[name], DNP, AGNP-c [facility]  [address] [place], [place] [address] [phone]  ESTABLISHED PATIENT- Chronic Health and/or Follow-Up Visit  Blood pressure 124/82, pulse 70, weight 204 lb 3.2 oz (92.6 kg), last menstrual period 03/20/2016.    Michelle Ray is a 57 y.o. year old female presenting today for evaluation and management of chronic conditions.  HTN, DM, HLD.  DM She has been unable to get the [facility] filled regularly due to the shortage, so she has switched to Ozempic and she is waiting on the first dose to be filled. She is monitoring her diet and has been keeping her blood sugar levels under good control with ranges from 70-145. She has had a couple of hypoglycemic episodes with  BG into the 50's. She is managing her diet excellently and hopes to get back to regular exercise following her recovery from surgery.   She is having ligament re-attachment to the right ankle tomorrow. She will be out of work for 1 weeks then on light duty for a couple of weeks after surgery.    The patient reports experiencing brain fog, which she suspects Penrose be related to menopause. She has not had a period in approximately seven years and has a history of an ablation procedure. She occasionally uses estradiol but does not require a refill at this time.  She reports her pain is fairly well controlled with varying intensity.   All ROS negative with exception of what is listed above.   PHYSICAL EXAM Physical Exam Vitals and nursing note reviewed.  Constitutional:      General: She is not in acute distress.    Appearance: Normal appearance.  HENT:     Head: Normocephalic.  Eyes:     Conjunctiva/sclera: Conjunctivae normal.  Neck:     Vascular: No carotid bruit.  Cardiovascular:     Rate and Rhythm: Normal rate and regular rhythm.     Pulses: Normal pulses.     Heart sounds: Normal heart sounds. No murmur heard. Pulmonary:     Effort: Pulmonary effort is  normal.     Breath sounds: Normal breath sounds.  Musculoskeletal:     Right lower leg: No edema.     Left lower leg: No edema.  Skin:    General: Skin is warm and dry.     Capillary Refill: Capillary refill takes less than 2 seconds.  Neurological:     General: No focal deficit present.     Mental Status: She is alert and oriented to person, place, and time.     Motor: No weakness.  Psychiatric:        Mood and Affect: Mood normal.     PLAN Problem List Items Addressed This Visit     Hypertension associated with diabetes (HCC)    Blood pressure well-controlled on current medications during today's visit. No associated symptoms reported. Plan: - Continue current antihypertensive medications - Encourage home blood pressure monitoring      Relevant Medications   triamterene-hydrochlorothiazide (MAXZIDE-25) 37.5-25 MG tablet   furosemide (LASIX) 40 MG tablet   ezetimibe (ZETIA) 10 MG tablet   benazepril (LOTENSIN) 20 MG tablet   Other Relevant Orders   CBC with Differential/Platelet (Completed)   CMP14+EGFR (Completed)   Hemoglobin A1c (Completed)   Lipid panel (Completed)   Controlled type 2 diabetes mellitus with complication, without long-term current use of insulin (HCC) - Primary    Patient is currently managed on Mounjaro for type 2 diabetes but is transitioning  to Ozempic with her next medication change due to supply issues. Blood glucose levels are reported to be in the range of 75-145 mg/dL. She has had some hypoglycemia, most likely related to missed meals. No other side effects noted.  Plan: - Transition to Ozempic with next medication fill and monitor closely. If medication is not effective, let us know and we can transition back to [facility].  - Monitor blood glucose levels and report any concerns or side effects - Ensure you are not skipping meals and keep snacks available for management of hypoglycemia      Relevant Medications   benazepril (LOTENSIN) 20 MG  tablet   Other Relevant Orders   CBC with Differential/Platelet (Completed)   CMP14+EGFR (Completed)   Hemoglobin A1c (Completed)   Lipid panel (Completed)   Hyperlipidemia associated with type 2 diabetes mellitus (HCC)    Historical lipid elevation in the setting of diabetes. Patient has ben unable to tolerate statin therapy, but is managed with zetia. We could consider low dose rosuvastatin starting once a week and increasing to goal of 3 days a week as tolerated if labs indicate. Will monitor labs and determine risks and we can discuss as needed.  Plan: - Continue with diet and blood sugar control - Continue zetia - Increase fiber to help reduce lipids       Relevant Medications   triamterene-hydrochlorothiazide (MAXZIDE-25) 37.5-25 MG tablet   furosemide (LASIX) 40 MG tablet   ezetimibe (ZETIA) 10 MG tablet   benazepril (LOTENSIN) 20 MG tablet   Other Relevant Orders   CBC with Differential/Platelet (Completed)   CMP14+EGFR (Completed)   Hemoglobin A1c (Completed)   Lipid panel (Completed)   BMI 40.0-44.9, adult (HCC)    Successful weight management with diet, exercise, and addition of GLP-1/GIP therapy. She is doing very well. Plan: - Continue current therapy and lifestyle changes      Relevant Orders   CBC with Differential/Platelet (Completed)   CMP14+EGFR (Completed)   Hemoglobin A1c (Completed)   Lipid panel (Completed)   Aortic atherosclerosis (HCC)    Chronic management of statins and blood sugars in place with weight management.  Plan: - Continue current therapy.        Relevant Medications   triamterene-hydrochlorothiazide (MAXZIDE-25) 37.5-25 MG tablet   furosemide (LASIX) 40 MG tablet   ezetimibe (ZETIA) 10 MG tablet   benazepril (LOTENSIN) 20 MG tablet   Other Relevant Orders   CBC with Differential/Platelet (Completed)   CMP14+EGFR (Completed)   Hemoglobin A1c (Completed)   Lipid panel (Completed)   Fibromyalgia muscle pain    Pain fairly well  managed at this time with cymbalta and PRN tramadol. I am hopeful that following ankle surgery she will have improvement of symptoms with routine exercise.  Plan: - continue current medication management - work to slowly increase activity following ankle surgery      Relevant Medications   DULoxetine (CYMBALTA) 20 MG capsule   Right ankle instability    Patient scheduled for right ankle surgery to clean up joint and reattach ligament. Plan: - Follow up with surgeon as needed      Brain fog    Patient reports brain fog, possibly related to menopause. No thyroid symptoms present. Plan:   - Order labs to check vitamin B, D, and thyroid levels - Consider trial of Provigil if labs normal and symptoms persist      Relevant Orders   Vitamin B12 (Completed)   VITAMIN D 25 Hydroxy (Vit-D Deficiency, Fractures) (Completed)  Gastroesophageal reflux disease   Relevant Medications   esomeprazole (NEXIUM) 40 MG capsule   Bilateral leg edema   Relevant Medications   furosemide (LASIX) 40 MG tablet   Vaginal atrophy    Using estradiol inconsistently per patient report. Plan: - Encourage consistent use of estradiol routinely for best effect - Consider utilizing Aquaphor topically to vaginal introitus and perineal area to assist in vaginal moisture - Monitor for symptom/side effect changes       Return in about 6 months (around 11/23/2023) for CPE.  Time: 35 minutes, >50% spent counseling, care coordination, chart review, and documentation.   [name], DNP, AGNP-c

## 2023-05-24 NOTE — Anesthesia Preprocedure Evaluation (Signed)
Anesthesia Evaluation  Patient identified by MRN, date of birth, ID band Patient awake    Reviewed: Allergy & Precautions, NPO status , Patient's Chart, lab work & pertinent test results  Airway Mallampati: II  TM Distance: >3 FB Neck ROM: Full    Dental no notable dental hx. (+) Teeth Intact, Dental Advisory Given   Pulmonary former smoker   Pulmonary exam normal breath sounds clear to auscultation       Cardiovascular hypertension, + Peripheral Vascular Disease  Normal cardiovascular exam Rhythm:Regular Rate:Normal  11/2020 Echo 1. Left ventricular ejection fraction, by estimation, is 60 to 65%. The  left ventricle has normal function. The left ventricle has no regional  wall motion abnormalities. Left ventricular diastolic parameters are  consistent with Grade I diastolic  dysfunction (impaired relaxation).   2. Right ventricular systolic function is normal. The right ventricular  size is normal.   3. The mitral valve is normal in structure. No evidence of mitral valve  regurgitation.   4. The aortic valve is grossly normal. Aortic valve regurgitation is  trivial.     Neuro/Psych negative neurological ROS  negative psych ROS   GI/Hepatic ,GERD  ,,  Endo/Other  diabetes  Pt on Ozempic waiting to be filled  Renal/GU Lab Results      Component                Value               Date                      CREATININE               1.24 (H)            05/24/2023                BUN                      30 (H)              05/24/2023                NA                       139                 05/24/2023                K                        4.3                 05/24/2023                     Musculoskeletal negative musculoskeletal ROS (+)    Abdominal  (+) + obese (BMI 37.4)  Peds  Hematology Lab Results      Component                Value               Date                      WBC                      6.1  05/24/2023                HGB                      13.4                05/24/2023                HCT                      41.6                05/24/2023                MCV                      91                  05/24/2023                PLT                      402                 05/24/2023              Anesthesia Other Findings All: Crestor, Macfobid, Pravastatin  Reproductive/Obstetrics negative OB ROS                             Anesthesia Physical Anesthesia Plan  ASA: 3  Anesthesia Plan: Regional   Post-op Pain Management: Precedex and Tylenol PO (pre-op)*   Induction:   PONV Risk Score and Plan: 3 and TIVA, Treatment Margolis vary due to age or medical condition, Ondansetron and Propofol infusion  Airway Management Planned: Nasal Cannula and Natural Airway  Additional Equipment: None  Intra-op Plan:   Post-operative Plan:   Informed Consent: I have reviewed the patients History and Physical, chart, labs and discussed the procedure including the risks, benefits and alternatives for the proposed anesthesia with the patient or authorized representative who has indicated his/her understanding and acceptance.     Dental advisory given  Plan Discussed with: CRNA  Anesthesia Plan Comments: (R popliteal w R Adductor canal)        Anesthesia Quick Evaluation

## 2023-05-24 NOTE — Patient Instructions (Signed)
If your labs look good then we can try Modafinil.

## 2023-05-25 ENCOUNTER — Ambulatory Visit (HOSPITAL_BASED_OUTPATIENT_CLINIC_OR_DEPARTMENT_OTHER): Payer: Commercial Managed Care - PPO | Admitting: Anesthesiology

## 2023-05-25 ENCOUNTER — Other Ambulatory Visit (HOSPITAL_BASED_OUTPATIENT_CLINIC_OR_DEPARTMENT_OTHER): Payer: Self-pay | Admitting: Orthopaedic Surgery

## 2023-05-25 ENCOUNTER — Other Ambulatory Visit: Payer: Self-pay

## 2023-05-25 ENCOUNTER — Encounter (HOSPITAL_BASED_OUTPATIENT_CLINIC_OR_DEPARTMENT_OTHER)
Admission: RE | Disposition: A | Discharge status: Home / Self Care | Payer: Self-pay | Source: Home / Self Care | Attending: Orthopaedic Surgery

## 2023-05-25 ENCOUNTER — Encounter (HOSPITAL_BASED_OUTPATIENT_CLINIC_OR_DEPARTMENT_OTHER): Payer: Self-pay | Admitting: Orthopaedic Surgery

## 2023-05-25 ENCOUNTER — Ambulatory Visit (HOSPITAL_BASED_OUTPATIENT_CLINIC_OR_DEPARTMENT_OTHER)
Admission: RE | Admit: 2023-05-25 | Discharge: 2023-05-25 | Disposition: A | Discharge status: Home / Self Care | Payer: Commercial Managed Care - PPO | Attending: Orthopaedic Surgery | Admitting: Orthopaedic Surgery

## 2023-05-25 ENCOUNTER — Ambulatory Visit (HOSPITAL_BASED_OUTPATIENT_CLINIC_OR_DEPARTMENT_OTHER): Payer: Commercial Managed Care - PPO

## 2023-05-25 DIAGNOSIS — E669 Obesity, unspecified: Secondary | ICD-10-CM | POA: Diagnosis not present

## 2023-05-25 DIAGNOSIS — M659 Synovitis and tenosynovitis, unspecified: Secondary | ICD-10-CM | POA: Diagnosis not present

## 2023-05-25 DIAGNOSIS — M25371 Other instability, right ankle: Secondary | ICD-10-CM

## 2023-05-25 DIAGNOSIS — I10 Essential (primary) hypertension: Secondary | ICD-10-CM

## 2023-05-25 DIAGNOSIS — Z7985 Long-term (current) use of injectable non-insulin antidiabetic drugs: Secondary | ICD-10-CM | POA: Insufficient documentation

## 2023-05-25 DIAGNOSIS — Z87891 Personal history of nicotine dependence: Secondary | ICD-10-CM | POA: Diagnosis not present

## 2023-05-25 DIAGNOSIS — E118 Type 2 diabetes mellitus with unspecified complications: Secondary | ICD-10-CM

## 2023-05-25 DIAGNOSIS — Z6837 Body mass index (BMI) 37.0-37.9, adult: Secondary | ICD-10-CM | POA: Diagnosis not present

## 2023-05-25 DIAGNOSIS — I739 Peripheral vascular disease, unspecified: Secondary | ICD-10-CM | POA: Diagnosis not present

## 2023-05-25 DIAGNOSIS — Z7984 Long term (current) use of oral hypoglycemic drugs: Secondary | ICD-10-CM | POA: Diagnosis not present

## 2023-05-25 DIAGNOSIS — G8918 Other acute postprocedural pain: Secondary | ICD-10-CM | POA: Diagnosis not present

## 2023-05-25 DIAGNOSIS — Z09 Encounter for follow-up examination after completed treatment for conditions other than malignant neoplasm: Secondary | ICD-10-CM | POA: Insufficient documentation

## 2023-05-25 DIAGNOSIS — Z01818 Encounter for other preprocedural examination: Secondary | ICD-10-CM

## 2023-05-25 DIAGNOSIS — E1151 Type 2 diabetes mellitus with diabetic peripheral angiopathy without gangrene: Secondary | ICD-10-CM | POA: Diagnosis not present

## 2023-05-25 DIAGNOSIS — E119 Type 2 diabetes mellitus without complications: Secondary | ICD-10-CM | POA: Insufficient documentation

## 2023-05-25 HISTORY — PX: ANKLE ARTHROSCOPY: SHX545

## 2023-05-25 LAB — GLUCOSE, CAPILLARY: Glucose-Capillary: 94 mg/dL (ref 70–99)

## 2023-05-25 LAB — LIPID PANEL
Chol/HDL Ratio: 4.2 ratio (ref 0.0–4.4)
Cholesterol, Total: 191 mg/dL (ref 100–199)
HDL: 46 mg/dL (ref >39)
LDL Chol Calc (NIH): 94 mg/dL (ref 0–99)
VLDL Cholesterol Cal: 51 mg/dL — ABNORMAL HIGH (ref 5–40)

## 2023-05-25 LAB — VITAMIN B12: Vitamin B-12: 653 pg/mL (ref 232–1245)

## 2023-05-25 LAB — CMP14+EGFR
AST: 18 IU/L (ref 0–40)
Albumin: 4.5 g/dL (ref 3.8–4.9)
Alkaline Phosphatase: 67 IU/L (ref 44–121)
BUN/Creatinine Ratio: 24 — ABNORMAL HIGH (ref 9–23)
BUN: 30 mg/dL — ABNORMAL HIGH (ref 6–24)
Chloride: 99 mmol/L (ref 96–106)
Creatinine, Ser: 1.24 mg/dL — ABNORMAL HIGH (ref 0.57–1.00)
Globulin, Total: 3.1 g/dL (ref 1.5–4.5)
Glucose: 101 mg/dL — ABNORMAL HIGH (ref 70–99)
Sodium: 139 mmol/L (ref 134–144)
Total Protein: 7.6 g/dL (ref 6.0–8.5)

## 2023-05-25 LAB — CBC WITH DIFFERENTIAL/PLATELET
Basophils Absolute: 0 10*3/uL (ref 0.0–0.2)
Basos: 1 %
EOS (ABSOLUTE): 0.2 10*3/uL (ref 0.0–0.4)
Hematocrit: 41.6 % (ref 34.0–46.6)
Hemoglobin: 13.4 g/dL (ref 11.1–15.9)
Immature Grans (Abs): 0 10*3/uL (ref 0.0–0.1)
Lymphocytes Absolute: 1.8 10*3/uL (ref 0.7–3.1)
Lymphs: 29 %
MCH: 29.3 pg (ref 26.6–33.0)
MCHC: 32.2 g/dL (ref 31.5–35.7)
MCV: 91 fL (ref 79–97)
Monocytes Absolute: 0.4 10*3/uL (ref 0.1–0.9)
Monocytes: 7 %
Neutrophils: 60 %
Platelets: 402 10*3/uL (ref 150–450)
RBC: 4.58 x10E6/uL (ref 3.77–5.28)
RDW: 12.3 % (ref 11.7–15.4)

## 2023-05-25 LAB — VITAMIN D 25 HYDROXY (VIT D DEFICIENCY, FRACTURES): Vit D, 25-Hydroxy: 37.3 ng/mL (ref 30.0–100.0)

## 2023-05-25 LAB — HEMOGLOBIN A1C: Est. average glucose Bld gHb Est-mCnc: 111 mg/dL

## 2023-05-25 SURGERY — ARTHROSCOPY, ANKLE
Anesthesia: Regional | Site: Ankle | Laterality: Right

## 2023-05-25 MED ORDER — PROPOFOL 500 MG/50ML IV EMUL
INTRAVENOUS | Status: DC | PRN
  Administered 2023-05-25: 75 ug/kg/min via INTRAVENOUS

## 2023-05-25 MED ORDER — PROPOFOL 10 MG/ML IV BOLUS
INTRAVENOUS | Status: AC
  Filled 2023-05-25: qty 20

## 2023-05-25 MED ORDER — TRANEXAMIC ACID-NACL 1000-0.7 MG/100ML-% IV SOLN
INTRAVENOUS | Status: AC
  Filled 2023-05-25: qty 100

## 2023-05-25 MED ORDER — GABAPENTIN 300 MG PO CAPS
ORAL_CAPSULE | ORAL | Status: AC
  Filled 2023-05-25: qty 1

## 2023-05-25 MED ORDER — ONDANSETRON HCL 4 MG/2ML IJ SOLN: 4.0000 mg | Freq: Once | INTRAMUSCULAR | Status: DC | PRN

## 2023-05-25 MED ORDER — SUCCINYLCHOLINE CHLORIDE 200 MG/10ML IV SOSY
PREFILLED_SYRINGE | INTRAVENOUS | Status: AC
  Filled 2023-05-25: qty 10

## 2023-05-25 MED ORDER — PROPOFOL 10 MG/ML IV BOLUS
INTRAVENOUS | Status: DC | PRN
  Administered 2023-05-25: 30 mg via INTRAVENOUS

## 2023-05-25 MED ORDER — LIDOCAINE 2% (20 MG/ML) 5 ML SYRINGE
INTRAMUSCULAR | Status: AC
  Filled 2023-05-25: qty 5

## 2023-05-25 MED ORDER — CEFAZOLIN SODIUM-DEXTROSE 2-4 GM/100ML-% IV SOLN
INTRAVENOUS | Status: AC
  Filled 2023-05-25: qty 100

## 2023-05-25 MED ORDER — HYDROMORPHONE HCL 1 MG/ML IJ SOLN: 0.2500 mg | INTRAMUSCULAR | Status: DC | PRN

## 2023-05-25 MED ORDER — 0.9 % SODIUM CHLORIDE (POUR BTL) OPTIME
TOPICAL | Status: DC | PRN
  Administered 2023-05-25: 300 mL

## 2023-05-25 MED ORDER — ROCURONIUM BROMIDE 10 MG/ML (PF) SYRINGE
PREFILLED_SYRINGE | INTRAVENOUS | Status: AC
  Filled 2023-05-25: qty 10

## 2023-05-25 MED ORDER — FENTANYL CITRATE (PF) 100 MCG/2ML IJ SOLN
INTRAMUSCULAR | Status: AC
  Filled 2023-05-25: qty 2

## 2023-05-25 MED ORDER — DEXMEDETOMIDINE HCL IN NACL 80 MCG/20ML IV SOLN
INTRAVENOUS | Status: DC | PRN
  Administered 2023-05-25 (×2): 12 ug via INTRAVENOUS

## 2023-05-25 MED ORDER — ACETAMINOPHEN 500 MG PO TABS
ORAL_TABLET | ORAL | Status: AC
  Filled 2023-05-25: qty 2

## 2023-05-25 MED ORDER — OXYCODONE HCL 5 MG PO TABS: 5.0000 mg | ORAL_TABLET | Freq: Once | ORAL | Status: DC | PRN

## 2023-05-25 MED ORDER — ACETAMINOPHEN 500 MG PO TABS
1000.0000 mg | ORAL_TABLET | Freq: Once | ORAL | Status: AC
  Administered 2023-05-25: 1000 mg via ORAL

## 2023-05-25 MED ORDER — ONDANSETRON HCL 4 MG/2ML IJ SOLN
INTRAMUSCULAR | Status: DC | PRN
  Administered 2023-05-25: 4 mg via INTRAVENOUS

## 2023-05-25 MED ORDER — GABAPENTIN 300 MG PO CAPS
300.0000 mg | ORAL_CAPSULE | Freq: Once | ORAL | Status: AC
  Administered 2023-05-25: 300 mg via ORAL

## 2023-05-25 MED ORDER — FENTANYL CITRATE (PF) 100 MCG/2ML IJ SOLN
100.0000 ug | Freq: Once | INTRAMUSCULAR | Status: AC
  Administered 2023-05-25: 100 ug via INTRAVENOUS

## 2023-05-25 MED ORDER — BUPIVACAINE HCL (PF) 0.25 % IJ SOLN
INTRAMUSCULAR | Status: AC
  Filled 2023-05-25: qty 30

## 2023-05-25 MED ORDER — ONDANSETRON HCL 4 MG/2ML IJ SOLN
INTRAMUSCULAR | Status: AC
  Filled 2023-05-25: qty 2

## 2023-05-25 MED ORDER — TRANEXAMIC ACID-NACL 1000-0.7 MG/100ML-% IV SOLN
1000.0000 mg | INTRAVENOUS | Status: AC
  Administered 2023-05-25: 1000 mg via INTRAVENOUS

## 2023-05-25 MED ORDER — KETOROLAC TROMETHAMINE 30 MG/ML IJ SOLN: 30.0000 mg | Freq: Once | INTRAMUSCULAR | Status: DC | PRN

## 2023-05-25 MED ORDER — VANCOMYCIN HCL 1000 MG IV SOLR
INTRAVENOUS | Status: AC
  Filled 2023-05-25: qty 20

## 2023-05-25 MED ORDER — OXYCODONE HCL 5 MG/5ML PO SOLN: 5.0000 mg | Freq: Once | ORAL | Status: DC | PRN

## 2023-05-25 MED ORDER — ROPIVACAINE HCL 5 MG/ML IJ SOLN
INTRAMUSCULAR | Status: DC | PRN
  Administered 2023-05-25: 30 mL via PERINEURAL
  Administered 2023-05-25: 15 mL via PERINEURAL

## 2023-05-25 MED ORDER — CEFAZOLIN SODIUM-DEXTROSE 2-4 GM/100ML-% IV SOLN
2.0000 g | INTRAVENOUS | Status: AC
  Administered 2023-05-25: 2 g via INTRAVENOUS

## 2023-05-25 MED ORDER — MIDAZOLAM HCL 2 MG/2ML IJ SOLN
2.0000 mg | Freq: Once | INTRAMUSCULAR | Status: AC
  Administered 2023-05-25: 2 mg via INTRAVENOUS

## 2023-05-25 MED ORDER — LACTATED RINGERS IV SOLN: INTRAVENOUS | Status: DC

## 2023-05-25 MED ORDER — MIDAZOLAM HCL 2 MG/2ML IJ SOLN
INTRAMUSCULAR | Status: AC
  Filled 2023-05-25: qty 2

## 2023-05-25 MED ORDER — CLONIDINE HCL (ANALGESIA) 100 MCG/ML EP SOLN
EPIDURAL | Status: DC | PRN
  Administered 2023-05-25 (×2): 100 ug

## 2023-05-25 MED ORDER — DEXAMETHASONE SODIUM PHOSPHATE 10 MG/ML IJ SOLN
INTRAMUSCULAR | Status: AC
  Filled 2023-05-25: qty 1

## 2023-05-25 SURGICAL SUPPLY — 59 items
APL PRP STRL LF DISP 70% ISPRP (MISCELLANEOUS) ×1
BLADE SURG 15 STRL LF DISP TIS (BLADE) ×1 IMPLANT
BLADE SURG 15 STRL SS (BLADE) ×1
BNDG CMPR 5X4 KNIT ELC UNQ LF (GAUZE/BANDAGES/DRESSINGS)
BNDG CMPR 5X62 HK CLSR LF (GAUZE/BANDAGES/DRESSINGS)
BNDG CMPR 6"X 5 YARDS HK CLSR (GAUZE/BANDAGES/DRESSINGS)
BNDG ELASTIC 4INX 5YD STR LF (GAUZE/BANDAGES/DRESSINGS) IMPLANT
BNDG ELASTIC 6INX 5YD STR LF (GAUZE/BANDAGES/DRESSINGS) IMPLANT
CHLORAPREP W/TINT 26 (MISCELLANEOUS) ×1 IMPLANT
DISSECTOR 3.8MM X 13CM (MISCELLANEOUS) IMPLANT
DRAPE EXTREMITY T 121X128X90 (DISPOSABLE) ×1 IMPLANT
DRAPE IMP U-DRAPE 54X76 (DRAPES) ×1 IMPLANT
DRAPE INCISE IOBAN 66X45 STRL (DRAPES) IMPLANT
DRAPE OEC MINIVIEW 54X84 (DRAPES) ×1 IMPLANT
DRAPE U-SHAPE 47X51 STRL (DRAPES) ×1 IMPLANT
ELECT REM PT RETURN 9FT ADLT (ELECTROSURGICAL) ×1
ELECTRODE REM PT RTRN 9FT ADLT (ELECTROSURGICAL) ×1 IMPLANT
EXCALIBUR 3.8MM X 13CM (MISCELLANEOUS) ×1 IMPLANT
GAUZE SPONGE 4X4 12PLY STRL (GAUZE/BANDAGES/DRESSINGS) ×1 IMPLANT
GAUZE XEROFORM 1X8 LF (GAUZE/BANDAGES/DRESSINGS) ×1 IMPLANT
GLOVE BIO SURGEON STRL SZ 6 (GLOVE) ×1 IMPLANT
GLOVE BIO SURGEON STRL SZ7.5 (GLOVE) ×1 IMPLANT
GLOVE BIOGEL PI IND STRL 6.5 (GLOVE) ×1 IMPLANT
GLOVE BIOGEL PI IND STRL 8 (GLOVE) ×1 IMPLANT
GLOVE ECLIPSE 8.0 STRL XLNG CF (GLOVE) ×1 IMPLANT
GOWN STRL REUS W/ TWL LRG LVL3 (GOWN DISPOSABLE) ×1 IMPLANT
GOWN STRL REUS W/ TWL XL LVL3 (GOWN DISPOSABLE) ×1 IMPLANT
GOWN STRL REUS W/TWL LRG LVL3 (GOWN DISPOSABLE) ×1
GOWN STRL REUS W/TWL XL LVL3 (GOWN DISPOSABLE) ×1
IMPL INTERNAL BRACE BIO (Anchor) IMPLANT
IMPLANT INTERNAL BRACE BIO (Anchor) ×1 IMPLANT
KIT FIBERTAK DX KNTLS DISP (KITS) IMPLANT
KIT FIBERTAK DX KNTLS INSTBLT (Anchor) IMPLANT
PACK ARTHROSCOPY DSU (CUSTOM PROCEDURE TRAY) ×1 IMPLANT
PACK BASIN DAY SURGERY FS (CUSTOM PROCEDURE TRAY) ×1 IMPLANT
PENCIL SMOKE EVACUATOR (MISCELLANEOUS) ×1 IMPLANT
SHAVER DISSECTOR 3.0 (BURR) IMPLANT
SHAVER SABRE 2.0 (BURR) IMPLANT
SHEET MEDIUM DRAPE 40X70 STRL (DRAPES) ×1 IMPLANT
SLEEVE SCD COMPRESS KNEE MED (STOCKING) ×1 IMPLANT
SPONGE T-LAP 4X18 ~~LOC~~+RFID (SPONGE) ×1 IMPLANT
STRAP ANKLE FOOT DISTRACTOR (ORTHOPEDIC SUPPLIES) ×1 IMPLANT
STRIP CLOSURE SKIN 1/2X4 (GAUZE/BANDAGES/DRESSINGS) IMPLANT
SUCTION TUBE FRAZIER 10FR DISP (SUCTIONS) ×1 IMPLANT
SUT ETHILON 3 0 PS 1 (SUTURE) ×1 IMPLANT
SUT FIBERWIRE #2 38 T-5 BLUE (SUTURE)
SUT MNCRL AB 3-0 PS2 18 (SUTURE) IMPLANT
SUT PDS AB 1 CT 36 (SUTURE) IMPLANT
SUT VIC AB 0 CT1 27 (SUTURE)
SUT VIC AB 0 CT1 27XBRD ANBCTR (SUTURE) IMPLANT
SUT VIC AB 0 CT1 27XCR 8 STRN (SUTURE) IMPLANT
SUT VIC AB 2-0 SH 27 (SUTURE) ×1
SUT VIC AB 2-0 SH 27XBRD (SUTURE) ×1 IMPLANT
SUT VICRYL 0 SH 27 (SUTURE) ×1 IMPLANT
SUTURE FIBERWR #2 38 T-5 BLUE (SUTURE) IMPLANT
SYR BULB EAR ULCER 3OZ GRN STR (SYRINGE) ×1 IMPLANT
TOWEL GREEN STERILE FF (TOWEL DISPOSABLE) ×2 IMPLANT
TUBING ARTHROSCOPY IRRIG 16FT (MISCELLANEOUS) ×1 IMPLANT
YANKAUER SUCT BULB TIP NO VENT (SUCTIONS) ×1 IMPLANT

## 2023-05-25 NOTE — Transfer of Care (Signed)
Immediate Anesthesia Transfer of Care Note  Patient: Treniya Groseclose  Procedure(s) Performed: RIGHT ANKLE ARTHROSCOPY WITH BROSTROM REPAIR (Right: Ankle)  Patient Location: PACU  Anesthesia Type:MAC  Level of Consciousness: awake and patient cooperative  Airway & Oxygen Therapy: Patient Spontanous Breathing and Patient connected to face mask oxygen  Post-op Assessment: Report given to RN and Post -op Vital signs reviewed and stable  Post vital signs: Reviewed and stable  Last Vitals:  Vitals Value Taken Time  BP 97/53   Temp    Pulse 65 05/25/23 0846  Resp    SpO2 95 % 05/25/23 0846  Vitals shown include unvalidated device data.  Last Pain:  Vitals:   05/25/23 0637  PainSc: 2       Patients Stated Pain Goal: 2 (05/25/23 5784)  Complications: No notable events documented.

## 2023-05-25 NOTE — Interval H&P Note (Signed)
History and Physical Interval Note:  05/25/2023 6:26 AM  Michelle Ray  has presented today for surgery, with the diagnosis of RIGHT ANKLE INSTABILITY.  The various methods of treatment have been discussed with the patient and family. After consideration of risks, benefits and other options for treatment, the patient has consented to  Procedure(s): RIGHT ANKLE ARTHROSCOPY WITH BROSTROM REPAIR (Right) as a surgical intervention.  The patient's history has been reviewed, patient examined, no change in status, stable for surgery.  I have reviewed the patient's chart and labs.  Questions were answered to the patient's satisfaction.     Huel Cote

## 2023-05-25 NOTE — Anesthesia Procedure Notes (Signed)
Anesthesia Regional Block: Popliteal block   Pre-Anesthetic Checklist: , timeout performed,  Correct Patient, Correct Site, Correct Laterality,  Correct Procedure, Correct Position, site marked,  Risks and benefits discussed,  Pre-op evaluation,  At surgeon's request and post-op pain management  Laterality: Lower and Right  Prep: Maximum Sterile Barrier Precautions used, chloraprep       Needles:  Injection technique: Single-shot  Needle Type: Echogenic Needle     Needle Length: 9cm  Needle Gauge: 21     Additional Needles:   Procedures:,,,, ultrasound used (permanent image in chart),,    Narrative:  Start time: 05/25/2023 6:53 AM End time: 05/25/2023 6:59 AM Injection made incrementally with aspirations every 5 mL.  Performed by: Personally  Anesthesiologist: Trevor Iha, MD  Additional Notes: Block assessed. Patient tolerated procedure well.

## 2023-05-25 NOTE — Op Note (Addendum)
Date of Surgery: 05/25/2023  INDICATIONS: Michelle Ray is a 57 y.o.-year-old female with recurrent ankle instability with significant pain and cellulitis.  The risk and benefits of the procedure were discussed in detail and documented in the pre-operative evaluation.   PREOPERATIVE DIAGNOSIS: 1.  Right ankle instability recurrent preoperative  POSTOPERATIVE DIAGNOSIS: Same.  PROCEDURE: 1..  Right ankle arthroscopy with the limited debridement 2.  Right ankle Brostrm repair with internal brace  SURGEON: Benancio Deeds MD  ASSISTANT: Kerby Less, ATC  ANESTHESIA:  general plus peripheral nerve block  IV FLUIDS AND URINE: See anesthesia record.  ANTIBIOTICS: Ancef  ESTIMATED BLOOD LOSS: 10 mL.  IMPLANTS:  Implant Name Type Inv. Item Serial No. Manufacturer Lot No. LRB No. Used Action  KIT FIBERTAK DX KNTLS INSTBLT - [id number] Anchor KIT FIBERTAK DX KNTLS INSTBLT  [employer] [id number] Right 1 Implanted  IMPLANT INTERNAL BRACE BIO - [id number] Anchor IMPLANT INTERNAL BRACE BIO  [employer] [id number] Right 1 Implanted    DRAINS: None  CULTURES: None  COMPLICATIONS: none  DESCRIPTION OF PROCEDURE:  Diagnostic Arthroscopy: Tibia:  No anterior tibia osteophyte Talus:  No talar neck osteophytes, normal articular cartilage surface Lateral ligaments:  Significant synovitis in lateral gutter with ATFL laxity Medial ligaments:  Significant synovitis     Patient was identified in the preoperative holding area.  The correct site was noted and marked according to universal protocol with nursing.  Anesthesia subsequently performed a peripheral nerve block.   The patient was subsequently taken back to the operating room and transferred over to the operating room table. Anesthesia was induced.  All bony prominences were padded.  He was prepped and draped in the usual sterile fashion.  Again timeout was held.  Ancef was given 1 hour prior to skin incision.  We began with the ankle  arthroscopy. Diagnostic arthroscopy began with a standard anterior medial incision. 20cc of normal saline was used to insufflate the joint. 11 blade was used to incise just through skin. Snap was used to spread down to the joint. The scope trocar was introduced.  Diagnostic arthroscopy ensued which showed overall healthy appearing cartilage involving the tibia and talus.  There was excessive laxity noted about the lateral gutter consistent with recurrent lateral ankle instability. There was significant synovitis in the lateral gutter. A shaver was introduced via an anterior medial portal and a limited debridement was performed of the anterior synovial inflammatory tissue.  The camera was then introduced into the lateral portal and the shaver into the medial portal.  Care was taken not to get superficial in this pretibial area.   Attention was then turned to the lateral Brostrm approach. 15 blade was then used to incise through skin in the posterior lateral aspect of the fibula.  This was taken down to the level of bone.  A cuff of tissue was then released from the anterior lateral fibula.  The peroneal tendons were both inspected individually and there was a significant amount of synovitis that was removed with both tendons using a Metzenbaum scissors.  At this time the drill was used to place anchors in the anterior and posterior aspect of the fibula.  The suture component of this was then placed with a good bite through the AITF ligament and the knotless mechanism was each anchor was fed.  These were subsequently tensioned in tandem with the foot in dorsiflexion and eversion.  This showed excellent approximation of the tissue.  We then placed an internal brace first  using the talar guide. This was placed onto the talus.  We drilled and tapped and then placed an anchor into the talus.  The same procedure was then used on the fibula just proximal to our previous all suture anchors with care to not over tension  the suture.  This showed excellent reduction of the anterior lateral ankle with a subsequently negative anterior drawer negative talar tilt.   Wounds were thoroughly or irrigated.  0 Vicryl was used to approximate the posterior fibular tissue over the peroneal tendons.  The wound was closed with 3-0 monocryl and 3-0 nylon for the skin and portals.   X-ray was taken at the end and confirmed no residual instability without complications.  POSTOPERATIVE PLAN: She will be weight bearing as tolerated on the right ankle in a CAM boot. She will begin PT postop week 1.  I will see her back in 2 weeks for suture removal.  She will be placed on aspirin for blood clot prevention  Benancio Deeds, MD 8:56 AM

## 2023-05-25 NOTE — Anesthesia Postprocedure Evaluation (Signed)
Anesthesia Post Note  Patient: Michelle Ray  Procedure(s) Performed: RIGHT ANKLE ARTHROSCOPY WITH BROSTROM REPAIR (Right: Ankle)     Patient location during evaluation: PACU Anesthesia Type: Regional and MAC Level of consciousness: awake and alert Pain management: pain level controlled Vital Signs Assessment: post-procedure vital signs reviewed and stable Respiratory status: spontaneous breathing, nonlabored ventilation, respiratory function stable and patient connected to nasal cannula oxygen Cardiovascular status: stable and blood pressure returned to baseline Postop Assessment: no apparent nausea or vomiting Anesthetic complications: no   No notable events documented.  Last Vitals:  Vitals:   05/25/23 0900 05/25/23 0932  BP: (!) 99/56 93/66  Pulse: (!) 55 66  Resp: 14 16  Temp:  (!) 36.3 C  SpO2: 100% 96%    Last Pain:  Vitals:   05/25/23 0932  PainSc: 3                  Trevor Iha

## 2023-05-25 NOTE — Discharge Instructions (Addendum)
Discharge Instructions    Attending Surgeon: Huel Cote, MD Office Phone Number: [phone]   Diagnosis and Procedures:    Surgeries Performed: Right ankle instability  Discharge Plan:    Diet: Resume usual diet. Begin with light or bland foods.  Drink plenty of fluids.  Activity:  Weight bearing as tolerated right ankle. You are advised to go home directly from the hospital or surgical center. Restrict your activities.  GENERAL INSTRUCTIONS: 1.  Please apply ice to your wound to help with swelling and inflammation. This will improve your comfort and your overall recovery following surgery.     2. Please call Dr. Serena Croissant office at [phone] with questions Monday-Friday during business hours. If no one answers, please leave a message and someone should get back to the patient within 24 hours. For emergencies please call 911 or proceed to the emergency room.   3. Patient to notify surgical team if experiences any of the following: Bowel/Bladder dysfunction, uncontrolled pain, nerve/muscle weakness, incision with increased drainage or redness, nausea/vomiting and Fever greater than 101.0 F.  Be alert for signs of infection including redness, streaking, odor, fever or chills. Be alert for excessive pain or bleeding and notify your surgeon immediately.  WOUND INSTRUCTIONS:   Leave your dressing, cast, or splint in place until your post operative visit.  Keep it clean and dry.  Always keep the incision clean and dry until the staples/sutures are removed. If there is no drainage from the incision you should keep it open to air. If there is drainage from the incision you must keep it covered at all times until the drainage stops  Do not soak in a bath tub, hot tub, pool, lake or other body of water until 21 days after your surgery and your incision is completely dry and healed.  If you have removable sutures (or staples) they must be removed 10-14 days (unless otherwise  instructed) from the day of your surgery.     1)  Elevate the extremity as much as possible.  2)  Keep the dressing clean and dry.  3)  Please call us if the dressing becomes wet or dirty.  4)  If you are experiencing worsening pain or worsening swelling, please call.     MEDICATIONS: Resume all previous home medications at the previous prescribed dose and frequency unless otherwise noted Start taking the  pain medications on an as-needed basis as prescribed  Please taper down pain medication over the next week following surgery.  Ideally you should not require a refill of any narcotic pain medication.  Take pain medication with food to minimize nausea. In addition to the prescribed pain medication, you Elena take over-the-counter pain relievers such as Tylenol.  Do NOT take additional tylenol if your pain medication already has tylenol in it.  Aspirin 325mg  daily for four weeks.      FOLLOWUP INSTRUCTIONS: 1. Follow up at the Physical Therapy Clinic 3-4 days following surgery. This appointment should be scheduled unless other arrangements have been made.The Physical Therapy scheduling number is [phone] if an appointment has not already been arranged.  2. Contact Dr. Serena Croissant office during office hours at [phone] or the practice after hours line at [phone] for non-emergencies. For medical emergencies call 911.   Discharge Location: Home    Post Anesthesia Home Care Instructions  Activity: Get plenty of rest for the remainder of the day. A responsible individual must stay with you for 24 hours following the procedure.  For the next 24 hours, DO NOT: -Drive a car -Advertising copywriter -Drink alcoholic beverages -Take any medication unless instructed by your physician -Make any legal decisions or sign important papers.  Meals: Start with liquid foods such as gelatin or soup. Progress to regular foods as tolerated. Avoid greasy, spicy, heavy foods. If nausea and/or  vomiting occur, drink only clear liquids until the nausea and/or vomiting subsides. Call your physician if vomiting continues.  Special Instructions/Symptoms: Your throat Kley feel dry or sore from the anesthesia or the breathing tube placed in your throat during surgery. If this causes discomfort, gargle with warm salt water. The discomfort should disappear within 24 hours.  If you had a scopolamine patch placed behind your ear for the management of post- operative nausea and/or vomiting:  1. The medication in the patch is effective for 72 hours, after which it should be removed.  Wrap patch in a tissue and discard in the trash. Wash hands thoroughly with soap and water. 2. You Loman remove the patch earlier than 72 hours if you experience unpleasant side effects which Kurt include dry mouth, dizziness or visual disturbances. 3. Avoid touching the patch. Wash your hands with soap and water after contact with the patch.  Regional Anesthesia Blocks  1. Numbness or the inability to move the "blocked" extremity Flippo last from 3-48 hours after placement. The length of time depends on the medication injected and your individual response to the medication. If the numbness is not going away after 48 hours, call your surgeon.  2. The extremity that is blocked will need to be protected until the numbness is gone and the  Strength has returned. Because you cannot feel it, you will need to take extra care to avoid injury. Because it Meinecke be weak, you Wuest have difficulty moving it or using it. You Delaney not know what position it is in without looking at it while the block is in effect.  3. For blocks in the legs and feet, returning to weight bearing and walking needs to be done carefully. You will need to wait until the numbness is entirely gone and the strength has returned. You should be able to move your leg and foot normally before you try and bear weight or walk. You will need someone to be with you when you first  try to ensure you do not fall and possibly risk injury.  4. Bruising and tenderness at the needle site are common side effects and will resolve in a few days.  5. Persistent numbness or new problems with movement should be communicated to the surgeon or the [facility] [phone] [facility] [phone]).  No tylenol until after 12:45 if needed today.

## 2023-05-25 NOTE — H&P (Signed)
Chief Complaint: Right ankle pain        History of Present Illness:    04/07/2023: Presents today for follow-up of her right ankle.  For MRI discussion.  She is having persistent instability and pain predominantly about the lateral aspect of the joint.  This is continuing to give out on her frequently.   Michelle Ray is a 57 y.o. female presents today with left knee pain which has been ongoing for 1-1/2 years.  She experiences occasional crepitus and popping in the medial aspect of the knee.  She does take Tylenol and ibuprofen as needed.  She does feel that the knee will buckle occasionally give out.  She experiences the pain predominantly about the medial foot as well as the posterior aspect of the knee.  She works as an Insurance underwriter at [employer].  No history of prior surgery.  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)     Constipation      uses OTC stool softener prn only- this is not chronic   Diabetes mellitus without complication (HCC)     GERD (gastroesophageal reflux disease)     Hyperlipemia     Hyperlipidemia     Hypertension           Past Surgical History:  Procedure Laterality Date   ABLATION       ENDOVENOUS ABLATION SAPHENOUS VEIN W/ LASER Right 01/28/2022    endovenous laser ablation right greater saphenous vein and stab phlebectomy 10-20 incisions right leg by Cari Caraway MD   TUBAL LIGATION       uterine fibroid removal       WISDOM TOOTH EXTRACTION        Social History         Socioeconomic History   Marital status: Married      Spouse name: Minerva Areola   Number of children: Not on file   Years of education: Not on file   Highest education level: Master's degree (e.g.,  MA, MS, MEng, MEd, MSW, MBA)  Occupational History   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 Married to [place]. Adult children.     Social Determinants of Health    Financial Resource Strain: Not on file  Food Insecurity: Not on file  Transportation Needs: Not on file  Physical Activity: Not on file  Stress: Not on file  Social Connections: Not on file         Family History  Problem Relation Age of Onset  Atrial fibrillation Mother     Pulmonary embolism Mother     Breast cancer Mother 99   Heart failure Sister     Breast cancer Maternal Grandmother     Hypertension Maternal Grandmother     Heart disease Maternal Grandfather     Stroke Paternal Grandfather     Colon cancer Neg Hx     Colon polyps Neg Hx           Allergies  Allergen Reactions   Crestor [Rosuvastatin]        Joint pain   Macrobid [Nitrofurantoin]     Pravastatin        Joint pain          Current Outpatient Medications  Medication Sig Dispense Refill   acetaminophen (TYLENOL) 500 MG tablet Take 1 tablet (500 mg total) by mouth every 8 (eight) hours for 10 days. 30 tablet 0   aspirin EC 325 MG tablet Take 1 tablet (325 mg total) by mouth daily. 14 tablet 0   ibuprofen (ADVIL) 800 MG tablet Take 1 tablet (800 mg total) by mouth every 8 (eight) hours for 10 days. Please take with food, please alternate with acetaminophen 30 tablet 0   oxyCODONE (OXY IR/ROXICODONE) 5 MG immediate release tablet Take 1 tablet (5 mg total) by mouth every 4 (four)  hours as needed (severe pain). 20 tablet 0   albuterol (VENTOLIN HFA) 108 (90 Base) MCG/ACT inhaler Inhale 2 puffs into the lungs every 6 (six) hours as needed for wheezing or shortness of breath (Cough). 6.7 g 0   aspirin EC 81 MG tablet Take 1 tablet (81 mg total) by mouth daily. Swallow whole. 90 tablet 3   Bempedoic Acid 180 MG TABS Take 1 tablet (180 mg) by mouth daily. 90 tablet 3   benazepril (LOTENSIN) 20 MG tablet Take 1 tablet (20 mg total) by mouth daily. 90 tablet 3   clobetasol ointment (TEMOVATE) 0.05 % Apply as directed-- twice daily x 2 weeks-- then once daily x 2 weeks-- then twice weekly 30 g 2   Continuous Glucose Sensor (FREESTYLE LIBRE 3 SENSOR) MISC Place 1 sensor on the skin every 14 days. Use to check glucose continuously 2 each 11   DULoxetine (CYMBALTA) 20 MG capsule Take 1 capsule (20 mg total) by mouth 2 (two) times daily. 180 capsule 2   DULoxetine (CYMBALTA) 20 MG capsule Take 2 capsules (40 mg total) by mouth daily. 180 capsule 2   esomeprazole (NEXIUM) 40 MG capsule TAKE 1 CAPSULE BY MOUTH ONCE A DAY 90 capsule 3   estradiol (ESTRACE VAGINAL) 0.1 MG/GM vaginal cream Place 1 Applicatorful vaginally nightly for 2 weeks, then twice weekly therafter 42.5 g 2   ezetimibe (ZETIA) 10 MG tablet Take 1 tablet by mouth daily. 90 tablet 3   furosemide (LASIX) 40 MG tablet TAKE 1 TABLET BY MOUTH ONCE A DAY 90 tablet 3   ondansetron (ZOFRAN) 8 MG tablet Take 1 tablet (8 mg total) by mouth every 8 (eight) hours as needed for nausea or vomiting. 30 tablet 3   predniSONE (DELTASONE) 10 MG tablet Take 1 tablet (10 mg total) by mouth daily with breakfast. Start at first sign of gout flair and continue for at least 2 days following resolution of symptoms. If no response in 5 days, please let provider know. 30 tablet 3   Semaglutide (RYBELSUS) 14 MG TABS Take 1 tablet (14 mg total) by mouth daily. 90 tablet 1   tirzepatide [facility]) 15  MG/0.5ML Pen Inject 15 mg into the skin once a week.  6 mL 6   traMADol (ULTRAM) 50 MG tablet Take 1 tablet (50 mg total) by mouth every 8 (eight) hours as needed for moderate pain or severe pain. 60 tablet 2   triamterene-hydrochlorothiazide (MAXZIDE-25) 37.5-25 MG tablet TAKE 1 TABLET BY MOUTH ONCE A DAY 90 tablet 3   TURMERIC PO Take by mouth.       VITAMIN D PO Take by mouth.        No current facility-administered medications for this visit.    Imaging Results (Last 48 hours)  No results found.     Review of Systems:   A ROS was performed including pertinent positives and negatives as documented in the HPI.   Physical Exam :   Constitutional: NAD and appears stated age Neurological: Alert and oriented Psych: Appropriate affect and cooperative There were no vitals taken for this visit.    Comprehensive Musculoskeletal Exam:           Musculoskeletal Exam  Gait Normal  Alignment Normal    Right Left  Inspection Normal Normal  Palpation      Tenderness Medial joint patellofemoral None  Crepitus Positive None  Effusion Trace None  Range of Motion      Extension 0 0  Flexion 135 135  Strength      Extension 5/5 5/5  Flexion 5/5 5/5  Ligament Exam        Generalized Laxity No No  Lachman Negative Negative   Pivot Shift Negative Negative  Anterior Drawer Negative Negative  Valgus at 0 Negative Negative  Valgus at 20 Negative Negative  Varus at 0 0 0  Varus at 20   0 0  Posterior Drawer at 90 0 0  Vascular/Lymphatic Exam      Edema None None  Venous Stasis Changes No No  Distal Circulation Normal Normal  Neurologic      Light Touch Sensation Intact Intact  Special Tests:       Tenderness to palpation about the right ankle joint.  There is some laxity with a talar tilt as well as anterior drawer maneuver.  There is no peroneal subluxation.  There is swelling about the joint with passive dorsiflexion 20 degrees.  Plantarflexion is to 30 degrees without pain.   Imaging:   Xray (4 views left knee): There is mild  medial joint space narrowing consistent with early osteoarthritis   Xray (3 view right ankle): Well-preserved ankle joint with areas of osteophytes and an old avulsion type injury about the medial malleolus   I personally reviewed and interpreted the radiographs.     Assessment:   57 y.o. female with right ankle recurrent instability and joint pain.  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  She did get nearly 1 month of relief from the injection but is now having persistent instability and pain.  Given that we did discuss possible alternatives and surgical discussion.  We did discuss the limitations and restrictions associated with Brostrm repair and ankle arthroscopy.  After long discussion she has elected for this. Plan :     -Plan for right ankle arthroscopy with Brostrm repair       After a lengthy discussion of treatment options, including risks, benefits, alternatives, complications of surgical and nonsurgical conservative options, the patient elected surgical repair.    The patient  is aware of the material risks  and complications including, but not limited to injury to adjacent structures, neurovascular injury, infection, numbness, bleeding, implant failure, thermal burns, stiffness, persistent pain, failure to heal, disease transmission from allograft, need for further surgery, dislocation, anesthetic risks, blood clots, risks of death,and others. The probabilities of surgical success and failure discussed with patient given their particular co-morbidities.The time and nature of expected rehabilitation and recovery was discussed.The patient's questions were all answered preoperatively.  No barriers to understanding were noted. I explained the natural history of the disease process and Rx rationale.  I explained to the patient what I considered to be reasonable expectations given their personal situation.  The  final treatment plan was arrived at through a shared patient decision making process model.     I personally saw and evaluated the patient, and participated in the management and treatment plan.   Huel Cote, MD Attending Physician, Orthopedic Surgery   This document was dictated using Dragon voice recognition software. A reasonable attempt at proof reading has been made to minimize errors.

## 2023-05-25 NOTE — Progress Notes (Signed)
Assisted Dr. Houser with right, adductor canal, popliteal, ultrasound guided block. Side rails up, monitors on throughout procedure. See vital signs in flow sheet. Tolerated Procedure well. ?

## 2023-05-25 NOTE — Anesthesia Procedure Notes (Addendum)
Anesthesia Regional Block: Adductor canal block   Pre-Anesthetic Checklist: , timeout performed,  Correct Patient, Correct Site, Correct Laterality,  Correct Procedure, Correct Position, site marked,  Risks and benefits discussed,  Surgical consent,  Pre-op evaluation,  At surgeon's request and post-op pain management  Laterality: Lower and Right  Prep: chloraprep       Needles:  Injection technique: Single-shot  Needle Type: Echogenic Needle     Needle Length: 9cm  Needle Gauge: 22     Additional Needles:   Procedures:,,,, ultrasound used (permanent image in chart),,    Narrative:  Start time: 05/25/2023 7:00 AM End time: 05/25/2023 7:03 AM Injection made incrementally with aspirations every 5 mL.  Performed by: Personally  Anesthesiologist: Trevor Iha, MD  Additional Notes: Block assessed prior to surgery. Pt tolerated procedure well.

## 2023-05-25 NOTE — Anesthesia Procedure Notes (Signed)
Procedure Name: MAC Date/Time: 05/25/2023 7:40 AM  Performed by: Demetrio Lapping, CRNAPre-anesthesia Checklist: Patient identified, Emergency Drugs available, Suction available, Patient being monitored and Timeout performed Patient Re-evaluated:Patient Re-evaluated prior to induction Oxygen Delivery Method: Simple face mask Placement Confirmation: positive ETCO2 Dental Injury: Teeth and Oropharynx as per pre-operative assessment

## 2023-05-25 NOTE — Brief Op Note (Signed)
   Brief Op Note  Date of Surgery: 05/25/2023  Preoperative Diagnosis: RIGHT ANKLE INSTABILITY  Postoperative Diagnosis: same  Procedure: Procedure(s): RIGHT ANKLE ARTHROSCOPY WITH BROSTROM REPAIR  Implants: Implant Name Type Inv. Item Serial No. Manufacturer Lot No. LRB No. Used Action  KIT FIBERTAK DX KNTLS INSTBLT - [id number] Anchor KIT FIBERTAK DX KNTLS INSTBLT  [employer] [id number] Right 1 Implanted  IMPLANT INTERNAL BRACE BIO - [id number] Anchor IMPLANT INTERNAL [employer] [id number] Right 1 Implanted    Surgeons: Surgeon(s): Huel Cote, MD  Anesthesia: Regional    Estimated Blood Loss: See anesthesia record  Complications: None  Condition to PACU: Stable  Benancio Deeds, MD 05/25/2023 8:42 AM

## 2023-05-26 ENCOUNTER — Encounter (HOSPITAL_BASED_OUTPATIENT_CLINIC_OR_DEPARTMENT_OTHER): Payer: Self-pay | Admitting: Orthopaedic Surgery

## 2023-06-01 ENCOUNTER — Encounter (HOSPITAL_BASED_OUTPATIENT_CLINIC_OR_DEPARTMENT_OTHER): Payer: Self-pay | Admitting: Physical Therapy

## 2023-06-01 ENCOUNTER — Ambulatory Visit (HOSPITAL_BASED_OUTPATIENT_CLINIC_OR_DEPARTMENT_OTHER): Payer: Commercial Managed Care - PPO | Attending: Orthopaedic Surgery | Admitting: Physical Therapy

## 2023-06-01 ENCOUNTER — Other Ambulatory Visit: Payer: Self-pay

## 2023-06-01 DIAGNOSIS — M6281 Muscle weakness (generalized): Secondary | ICD-10-CM | POA: Insufficient documentation

## 2023-06-01 DIAGNOSIS — M25371 Other instability, right ankle: Secondary | ICD-10-CM | POA: Insufficient documentation

## 2023-06-01 DIAGNOSIS — M25571 Pain in right ankle and joints of right foot: Secondary | ICD-10-CM | POA: Diagnosis not present

## 2023-06-01 DIAGNOSIS — R262 Difficulty in walking, not elsewhere classified: Secondary | ICD-10-CM | POA: Insufficient documentation

## 2023-06-01 DIAGNOSIS — M25671 Stiffness of right ankle, not elsewhere classified: Secondary | ICD-10-CM | POA: Diagnosis not present

## 2023-06-01 NOTE — Therapy (Signed)
OUTPATIENT PHYSICAL THERAPY LOWER EXTREMITY EVALUATION   Patient Name: Michelle Ray MRN: [id number] DOB:1966/03/04, 57 y.o., female Today's Date: 06/03/2023  END OF SESSION:  PT End of Session - 06/02/23 2329     Visit Number 1    Number of Visits 26    Date for PT Re-Evaluation 08/24/23    Authorization Type Gretta Began    PT Start Time 1620    PT Stop Time 1720    PT Time Calculation (min) 60 min    Activity Tolerance Patient tolerated treatment well    Behavior During Therapy WFL for tasks assessed/performed             Past Medical History:  Diagnosis Date   Allergy    Anemia    Aortic atherosclerosis (HCC)    Benign secondary hypertension due to renal artery stenosis (HCC)    Constipation    uses OTC stool softener prn only- this is not chronic   Diabetes mellitus without complication (HCC)    GERD (gastroesophageal reflux disease)    Hyperlipemia    Hyperlipidemia    Hypertension    Pain and swelling of right ankle 02/17/2021   Past Surgical History:  Procedure Laterality Date   ABLATION     ANKLE ARTHROSCOPY Right 05/25/2023   Procedure: RIGHT ANKLE ARTHROSCOPY WITH BROSTROM REPAIR;  Surgeon: Huel Cote, MD;  Location: [facility];  Service: Orthopedics;  Laterality: Right;   ENDOVENOUS ABLATION SAPHENOUS VEIN W/ LASER Right 01/28/2022   endovenous laser ablation right greater saphenous vein and stab phlebectomy 10-20 incisions right leg by Cari Caraway MD   TUBAL LIGATION     uterine fibroid removal     WISDOM TOOTH EXTRACTION     Patient Active Problem List   Diagnosis Date Noted   Vitamin D deficiency 06/02/2023   Right ankle instability 05/25/2023   RLS (restless legs syndrome) 03/06/2022   Fibromyalgia muscle pain 03/06/2022   Small right kidney 08/29/2021   Atrophy of right kidney 08/29/2021   Varicose veins of both legs with edema 02/17/2021   Stress incontinence in female 02/17/2021   Controlled type 2 diabetes  mellitus with complication, without long-term current use of insulin (HCC) 02/17/2021   Hyperlipidemia associated with type 2 diabetes mellitus (HCC) 02/17/2021   Right upper quadrant abdominal pain 02/17/2021   BMI 40.0-44.9, adult (HCC) 02/17/2021   Aortic atherosclerosis (HCC) 02/17/2021   Chronic thumb pain, bilateral 02/17/2021   Hypertension associated with diabetes (HCC) 05/29/2016     REFERRING PROVIDER: Huel Cote MD  REFERRING DIAG: M25.371 (ICD-10-CM) - Right ankle instability  S/p Right ankle Brostrm repair with internal brace and limited debridement  THERAPY DIAG:  Pain in right ankle and joints of right foot  Stiffness of right ankle, not elsewhere classified  Muscle weakness (generalized)  Difficulty in walking, not elsewhere classified  Rationale for Evaluation and Treatment: Rehabilitation  ONSET DATE: DOS 05/25/2023  SUBJECTIVE:   SUBJECTIVE STATEMENT: Pt states she twisted her ankle really bad in 1985 and had continued ankle instability.  Pt had times when her R ankle would give out with gait.  Pt had an exacerbation of pain in December.  She then had x rays and a MRI.  Pt had an US guided injection in January and performed exercises.  Pt reports injection provided relief for 1 month.   Pt underwent Right ankle arthroscopy with Brostrm repair with internal brace and limited debridement on 05/25/2023.  POSTOPERATIVE PLAN on operative note indicated:  She will be weight bearing as tolerated on the right ankle in a CAM boot. She will begin PT postop week 1.  Pt is ambulating in CAM boot.  She used crutches for 1 day.  Pt is ambulating without crutches without increased pain currently.  Pt is limited with her functional mobility skills including ambulation and stairs.  Pt is unable to perform her work activities correctly.  Pt unable to perform her normal workout activities.  Pt unable to drive.  PERTINENT HISTORY: Right ankle arthroscopy with Brostrm repair  with internal brace and limited debridement on 05/25/2023.  WBAT on R ankle in a CAM boot L knee OA, DM type 2, and HTN   PAIN:  Are you having pain? Yes Location:  R ankle, worse in lateral ankle NPRS:  1/10 current, 3-4/10 worst, 1/10 best  PRECAUTIONS: Other: per surgical protocol  WEIGHT BEARING RESTRICTIONS: Yes WBAT in  CAM boot  FALLS:  Has patient fallen in last 6 months? 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    PLOF: Independent.  Pt was able to perform her ADLs/IADLs and functional mobility skills independently.  Pt worked out at [employer] 2 days per week performing upper/lower body strengthening and cardio.    PATIENT GOALS:  have reduced pain at work, driving, walking normally, and return to workout  NEXT MD VISIT: 06/16/2023  OBJECTIVE:   DIAGNOSTIC FINDINGS: Pt is post op. Pt had x rays and a MRI prior to surgery.  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   PATIENT SURVEYS:  FOTO 58 with a goal of 66 at visit #12.  COGNITION: Overall cognitive status: Within functional limits for tasks assessed     SENSATION: Pt in CAM boot and is wearing a compression stocking and ACE bandage.  Pt took off her dressing on Sunday.  Her incision and portals are intact with stitches.  Pt does have some redness at portal cites and incision, though nothing abnormal.  Pt has no drainage.  PT applied gauze and tegaderm over incision and portals.  PT educated pt in dressings.   LOWER EXTREMITY ROM:  Active ROM Right eval Left eval  Hip flexion    Hip extension    Hip abduction    Hip adduction    Hip internal rotation    Hip external rotation    Knee flexion    Knee extension    Ankle dorsiflexion  18  Ankle  plantarflexion  68  Ankle inversion  32  Ankle eversion  15   (Blank rows = not tested)  LOWER EXTREMITY MMT:  Strength not tested due to healing constraints and surgical protocol    GAIT: Assistive device utilized:  CAM walker boot Level of assistance: Complete Independence Comments: Pt ambulates with a CAM boot on R LE applying good Wb'ing thru R LE.  She is minimally favoring R LE and had no pain with gait.     TODAY'S TREATMENT:  Pt performed quad sets with 5 sec hold in boot.  Pt also performed supine SLR in boot 2x10 reps.  She performed supine SLR in boot well without any c/o's.  Pt reports no hip, knee, ankle or foot pain with supine SLR with boot.  Pt states she felt fine performing supine SLR with boot.  Pt was instructed to perform supine SLR in boot.  Pt received a HEP handout and was educated in correct form and appropriate frequency.  PT instructed pt she should not have pain with HEP and should stop exercises if she does have pain.    PATIENT EDUCATION:  Education details: dx, relevant anatomy, post op and protocol limitations and restrictions, Wb'ing restriction, wound dressings, POC, and HEP.  PT instructed pt in using ice.   Person educated: Patient Education method: Explanation, Demonstration, Tactile cues, Verbal cues, and Handouts Education comprehension: verbalized understanding, returned demonstration, verbal cues required, and tactile cues required  HOME EXERCISE PROGRAM: Access Code: [email or web address] URL: [email or web address] Date: 06/01/2023 Prepared by: Aaron Edelman  Exercises - Supine Quadricep Sets  - 2 x daily - 7 x weekly - 2 sets - 10 reps - 5 seconds hold - Small Range Straight Leg Raise  - 1 x daily - 5 x weekly - 2 sets - 10 reps  (In Boot)  ASSESSMENT:  CLINICAL IMPRESSION: Patient is a 57 y.o. female 1 week  s/p Right ankle arthroscopy with Brostrm repair with internal brace and limited debridement.  Pt has WBAT restrictions with boot.  She is ambulating well with boot without an AD without increased pain.  Pt is limited with her normal functional mobility skills including ambulation and stairs.  Pt is limited with standing activities and IADLs. She is unable to perform her work activities and normal workout activities.  Pt has limited ankle ROM, muscle weakness in R LE, R ankle pain, and difficulty in walking.  Pt should benefit from skilled PT services to address impairments and to assist in restoring PLOF.    OBJECTIVE IMPAIRMENTS: Abnormal gait, decreased activity tolerance, decreased endurance, decreased mobility, difficulty walking, decreased ROM, decreased strength, hypomobility, increased edema, impaired flexibility, and pain.   ACTIVITY LIMITATIONS: standing, squatting, stairs, transfers, and locomotion level  PARTICIPATION LIMITATIONS: meal prep, cleaning, driving, shopping, community activity, and occupation  PERSONAL FACTORS: 1-2 comorbidities: L knee OA, DM type 2  are also affecting patient's functional outcome.   REHAB POTENTIAL: Good  CLINICAL DECISION MAKING: Stable/uncomplicated  EVALUATION COMPLEXITY: Low   GOALS:  SHORT TERM GOALS:   Pt will be independent and compliant with HEP for improved ROM, strength, mobility, and function. Baseline: Goal status: INITIAL Target date:  07/13/2023  2.  Pt will wean out of boot as allowed by MD without adverse effects.  Baseline:  Goal status: INITIAL Target date:  07/20/2023   3.  Pt will tolerate ankle ROM as appropriate per protocol without adverse effects for improved stiffness, ROM, and mobility. Baseline:  Goal status: INITIAL Target date:  07/13/2023     LONG TERM GOALS:   Pt will demo R ankle AROM to be [facility] t/o for performance of ADLs and IADLs. Baseline:  Goal status: INITIAL Target date:  08/17/2023   2.  Pt  will ambulate with a normalized heel to toe gait pattern without limping.  Baseline:  Goal status: INITIAL Target date:  08/17/2023  3.  Pt will ambulate extended community distance without increased pain and with good stability.  Baseline:  Goal status: INITIAL Target date:  09/14/2023   4.  Pt will demo 5/5 strength in DF and eversion, WFL in PF in sitting, and 4 to 4+/5 in inversion for improved performance of functional mobility skills and work activities.  Baseline:  Goal status: INITIAL Target date: 09/14/2023  5.  Pt will be able to perform stairs with a reciprocal gait with good control and without significant difficulty.  Baseline:  Goal status: INITIAL Target date: 09/14/2023  6.  Pt will demo 10 symmetrical bilat heel raises without significant pain for improved strength.  Baseline:  Goal status: INITIAL Target date:   08/24/2023  7.  Pt will be able to perform her occupational activities as allowed by MD without adverse effects  Goal status:  INITIAL  Target date:  09/14/2023    PLAN:  PT FREQUENCY:  1x/wk initially and progress to 2x/wk  PT DURATION: other: 15 weeks  PLANNED INTERVENTIONS: Therapeutic exercises, Therapeutic activity, Neuromuscular re-education, Balance training, Gait training, Patient/Family education, Self Care, Joint mobilization, Stair training, DME instructions, Aquatic Therapy, Dry Needling, Electrical stimulation, Cryotherapy, Moist heat, scar mobilization, Taping, Ultrasound, Manual therapy, and Re-evaluation  PLAN FOR NEXT SESSION: Cont per Ankle Brostrom repair protocol and MD orders.     Audie Clear III PT, DPT 06/03/23 1:32 AM

## 2023-06-02 ENCOUNTER — Other Ambulatory Visit: Payer: Self-pay | Admitting: Nurse Practitioner

## 2023-06-02 ENCOUNTER — Other Ambulatory Visit (HOSPITAL_COMMUNITY): Payer: Self-pay

## 2023-06-02 DIAGNOSIS — E559 Vitamin D deficiency, unspecified: Secondary | ICD-10-CM

## 2023-06-02 MED ORDER — VITAMIN D (ERGOCALCIFEROL) 1.25 MG (50000 UNIT) PO CAPS
50000.0000 [IU] | ORAL_CAPSULE | ORAL | 3 refills | Status: AC
  Filled 2023-06-02: qty 12, 84d supply, fill #0
  Filled 2023-08-30: qty 12, 84d supply, fill #1
  Filled 2024-03-15: qty 12, 84d supply, fill #2

## 2023-06-08 ENCOUNTER — Ambulatory Visit (HOSPITAL_BASED_OUTPATIENT_CLINIC_OR_DEPARTMENT_OTHER): Payer: Commercial Managed Care - PPO | Admitting: Physical Therapy

## 2023-06-08 ENCOUNTER — Encounter (HOSPITAL_BASED_OUTPATIENT_CLINIC_OR_DEPARTMENT_OTHER): Payer: Self-pay | Admitting: Physical Therapy

## 2023-06-08 DIAGNOSIS — M6281 Muscle weakness (generalized): Secondary | ICD-10-CM

## 2023-06-08 DIAGNOSIS — R262 Difficulty in walking, not elsewhere classified: Secondary | ICD-10-CM

## 2023-06-08 DIAGNOSIS — M25371 Other instability, right ankle: Secondary | ICD-10-CM | POA: Diagnosis not present

## 2023-06-08 DIAGNOSIS — M25571 Pain in right ankle and joints of right foot: Secondary | ICD-10-CM | POA: Diagnosis not present

## 2023-06-08 DIAGNOSIS — M25671 Stiffness of right ankle, not elsewhere classified: Secondary | ICD-10-CM

## 2023-06-08 NOTE — Therapy (Signed)
OUTPATIENT PHYSICAL THERAPY LOWER EXTREMITY TREATMENT   Patient Name: Michelle Ray MRN: [id number] DOB:[birth year], 57 y.o., female Today's Date: 06/08/2023  END OF SESSION:  PT End of Session - 06/08/23 0850     Visit Number 2    Number of Visits 26    Date for PT Re-Evaluation 08/24/23    Authorization Type Gretta Began    PT Start Time [phone]   arrives late   PT Stop Time 0919    PT Time Calculation (min) 28 min    Activity Tolerance Patient tolerated treatment well    Behavior During Therapy [facility] for tasks assessed/performed             Past Medical History:  Diagnosis Date   Allergy    Anemia    Aortic atherosclerosis (HCC)    Benign secondary hypertension due to renal artery stenosis (HCC)    Constipation    uses OTC stool softener prn only- this is not chronic   Diabetes mellitus without complication (HCC)    GERD (gastroesophageal reflux disease)    Hyperlipemia    Hyperlipidemia    Hypertension    Pain and swelling of right ankle 02/17/2021   Past Surgical History:  Procedure Laterality Date   ABLATION     ANKLE ARTHROSCOPY Right 05/25/2023   Procedure: RIGHT ANKLE ARTHROSCOPY WITH BROSTROM REPAIR;  Surgeon: Huel Cote, MD;  Location: [facility];  Service: Orthopedics;  Laterality: Right;   ENDOVENOUS ABLATION SAPHENOUS VEIN W/ LASER Right 01/28/2022   endovenous laser ablation right greater saphenous vein and stab phlebectomy 10-20 incisions right leg by Cari Caraway MD   TUBAL LIGATION     uterine fibroid removal     WISDOM TOOTH EXTRACTION     Patient Active Problem List   Diagnosis Date Noted   Vitamin D deficiency 06/02/2023   Right ankle instability 05/25/2023   RLS (restless legs syndrome) 03/06/2022   Fibromyalgia muscle pain 03/06/2022   Small right kidney 08/29/2021   Atrophy of right kidney 08/29/2021   Varicose veins of both legs with edema 02/17/2021   Stress incontinence in female 02/17/2021   Controlled type 2  diabetes mellitus with complication, without long-term current use of insulin (HCC) 02/17/2021   Hyperlipidemia associated with type 2 diabetes mellitus (HCC) 02/17/2021   Right upper quadrant abdominal pain 02/17/2021   BMI 40.0-44.9, adult (HCC) 02/17/2021   Aortic atherosclerosis (HCC) 02/17/2021   Chronic thumb pain, bilateral 02/17/2021   Hypertension associated with diabetes (HCC) 05/29/2016     REFERRING PROVIDER: Huel Cote MD  REFERRING DIAG: M25.371 (ICD-10-CM) - Right ankle instability  S/p Right ankle Brostrm repair with internal brace and limited debridement  THERAPY DIAG:  Pain in right ankle and joints of right foot  Stiffness of right ankle, not elsewhere classified  Muscle weakness (generalized)  Difficulty in walking, not elsewhere classified  Rationale for Evaluation and Treatment: Rehabilitation  ONSET DATE: DOS 05/25/2023   Days since surgery: 14  SUBJECTIVE:   SUBJECTIVE STATEMENT:  Pt reports no issues with the wound. She has removed stitches herself at this time. She has had no pain and the swelling is better managed at this time.   Eval: Pt states she twisted her ankle really bad in 1985 and had continued ankle instability.  Pt had times when her R ankle would give out with gait.  Pt had an exacerbation of pain in December.  She then had x rays and a MRI.  Pt had an  US guided injection in January and performed exercises.  Pt reports injection provided relief for 1 month.   Pt underwent Right ankle arthroscopy with Brostrm repair with internal brace and limited debridement on 05/25/2023.  POSTOPERATIVE PLAN on operative note indicated: She will be weight bearing as tolerated on the right ankle in a CAM boot. She will begin PT postop week 1.  Pt is ambulating in CAM boot.  She used crutches for 1 day.  Pt is ambulating without crutches without increased pain currently.  Pt is limited with her functional mobility skills including ambulation and  stairs.  Pt is unable to perform her work activities correctly.  Pt unable to perform her normal workout activities.  Pt unable to drive.  PERTINENT HISTORY: Right ankle arthroscopy with Brostrm repair with internal brace and limited debridement on 05/25/2023.  WBAT on R ankle in a CAM boot L knee OA, DM type 2, and HTN   PAIN:  Are you having pain? No Location:  R ankle, worse in lateral ankle NPRS:  0/10 current, 3-4/10 worst, 1/10 best  PRECAUTIONS: Other: per surgical protocol  WEIGHT BEARING RESTRICTIONS: Yes WBAT in  CAM boot  FALLS:  Has patient fallen in last 6 months? 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    PLOF: Independent.  Pt was able to perform her ADLs/IADLs and functional mobility skills independently.  Pt worked out at [employer] 2 days per week performing upper/lower body strengthening and cardio.    PATIENT GOALS:  have reduced pain at work, driving, walking normally, and return to workout  NEXT MD VISIT: 06/16/2023  OBJECTIVE:   DIAGNOSTIC FINDINGS: Pt is post op. Pt had x rays and a MRI prior to surgery.  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   PATIENT SURVEYS:  FOTO 58 with a goal of 66 at visit #12.   TODAY'S TREATMENT:                                                                                                                                Manual LAD with grade II R ankle DF mob  PROM ankle DF and PF  Exercises - Small Range Straight Leg Raise  - 2-3 x daily - 5 x weekly - 2 sets - 10 reps - Towel Scrunches  - 2-3 x daily - 7 x weekly - 3 sets - 10 reps - Seated Heel Raise  - 2-3 x daily - 7 x weekly - 2 sets - 20 reps - Seated Ankle Dorsiflexion Stretch  - 2-3 x daily - 7 x  weekly - 2 sets - 20 reps - Seated Toe Raise  -  2-3 x daily - 7 x weekly - 2 sets - 20 reps - Standing Weight Shift  - 2-3 x daily - 7 x weekly - 2 sets - 10 reps - 3 hold  PATIENT EDUCATION:  Education details:  anatomy, exercise progression, DOMS expectations, muscle firing,  envelope of function, HEP, POC .   Person educated: Patient Education method: Explanation, Demonstration, Tactile cues, Verbal cues, and Handouts Education comprehension: verbalized understanding, returned demonstration, verbal cues required, and tactile cues required  HOME EXERCISE PROGRAM: Access Code: [email or web address] URL: [email or web address] Date: 06/01/2023 Prepared by: Aaron Edelman   ASSESSMENT:  CLINICAL IMPRESSION: Pt is 2 wks post op at this time. Pt has removed stitches and wounds are clean and dry without signs of infection. Pt with DF that is ~3 degrees already at this time without complaints of stiffness or pain with movement. HEP updated at this time for more ankle motion. Pt advised to continue without avoiding A/PROM IV/EV at the ankle. Pt with very good tolerance to all exercise today. Plan to continue with ankle ROM as tolerated. Pt should benefit from skilled PT services to address impairments and to assist in restoring PLOF.    OBJECTIVE IMPAIRMENTS: Abnormal gait, decreased activity tolerance, decreased endurance, decreased mobility, difficulty walking, decreased ROM, decreased strength, hypomobility, increased edema, impaired flexibility, and pain.   ACTIVITY LIMITATIONS: standing, squatting, stairs, transfers, and locomotion level  PARTICIPATION LIMITATIONS: meal prep, cleaning, driving, shopping, community activity, and occupation  PERSONAL FACTORS: 1-2 comorbidities: L knee OA, DM type 2  are also affecting patient's functional outcome.   REHAB POTENTIAL: Good  CLINICAL DECISION MAKING: Stable/uncomplicated  EVALUATION COMPLEXITY: Low   GOALS:  SHORT TERM GOALS:   Pt  will be independent and compliant with HEP for improved ROM, strength, mobility, and function. Baseline: Goal status: INITIAL Target date:  07/13/2023  2.  Pt will wean out of boot as allowed by MD without adverse effects.  Baseline:  Goal status: INITIAL Target date:  07/20/2023   3.  Pt will tolerate ankle ROM as appropriate per protocol without adverse effects for improved stiffness, ROM, and mobility. Baseline:  Goal status: INITIAL Target date:  07/13/2023     LONG TERM GOALS:   Pt will demo R ankle AROM to be [facility] t/o for performance of ADLs and IADLs. Baseline:  Goal status: INITIAL Target date:  08/17/2023   2.  Pt will ambulate with a normalized heel to toe gait pattern without limping.  Baseline:  Goal status: INITIAL Target date:  08/17/2023  3.  Pt will ambulate extended community distance without increased pain and with good stability.  Baseline:  Goal status: INITIAL Target date: 09/14/2023   4.  Pt will demo 5/5 strength in DF and eversion, WFL in PF in sitting, and 4 to 4+/5 in inversion for improved performance of functional mobility skills and work activities.  Baseline:  Goal status: INITIAL Target date: 09/14/2023  5.  Pt will be able to perform stairs with a reciprocal gait with good control and without significant difficulty.  Baseline:  Goal status: INITIAL Target date: 09/14/2023  6.  Pt will demo 10 symmetrical bilat heel raises without significant pain for improved strength.  Baseline:  Goal status: INITIAL Target date:   08/24/2023  7.  Pt will be able to perform her occupational activities as allowed by MD without adverse effects  Goal status:  INITIAL  Target date:  09/14/2023    PLAN:  PT FREQUENCY:  1x/wk initially and progress  to 2x/wk  PT DURATION: other: 15 weeks  PLANNED INTERVENTIONS: Therapeutic exercises, Therapeutic activity, Neuromuscular re-education, Balance training, Gait training, Patient/Family education, Self  Care, Joint mobilization, Stair training, DME instructions, Aquatic Therapy, Dry Needling, Electrical stimulation, Cryotherapy, Moist heat, scar mobilization, Taping, Ultrasound, Manual therapy, and Re-evaluation  PLAN FOR NEXT SESSION: Cont per Ankle Brostrom repair protocol and MD orders.    Zebedee Iba PT, DPT 06/08/23 9:32 AM

## 2023-06-09 ENCOUNTER — Encounter (HOSPITAL_BASED_OUTPATIENT_CLINIC_OR_DEPARTMENT_OTHER): Payer: Commercial Managed Care - PPO | Admitting: Orthopaedic Surgery

## 2023-06-10 ENCOUNTER — Encounter: Payer: Self-pay | Admitting: Nurse Practitioner

## 2023-06-10 ENCOUNTER — Other Ambulatory Visit (HOSPITAL_COMMUNITY): Payer: Self-pay

## 2023-06-10 DIAGNOSIS — E1169 Type 2 diabetes mellitus with other specified complication: Secondary | ICD-10-CM

## 2023-06-10 DIAGNOSIS — I700 Atherosclerosis of aorta: Secondary | ICD-10-CM

## 2023-06-10 DIAGNOSIS — I152 Hypertension secondary to endocrine disorders: Secondary | ICD-10-CM

## 2023-06-10 DIAGNOSIS — E785 Hyperlipidemia, unspecified: Secondary | ICD-10-CM

## 2023-06-10 DIAGNOSIS — M797 Fibromyalgia: Secondary | ICD-10-CM

## 2023-06-11 ENCOUNTER — Telehealth: Payer: Self-pay | Admitting: Orthopaedic Surgery

## 2023-06-11 ENCOUNTER — Other Ambulatory Visit (HOSPITAL_COMMUNITY): Payer: Self-pay

## 2023-06-11 ENCOUNTER — Other Ambulatory Visit: Payer: Self-pay

## 2023-06-11 MED ORDER — CYCLOBENZAPRINE HCL 10 MG PO TABS
5.0000 mg | ORAL_TABLET | Freq: Three times a day (TID) | ORAL | 3 refills | Status: AC | PRN
Start: 2023-06-11 — End: ?
  Filled 2023-06-11: qty 60, 20d supply, fill #0
  Filled 2024-02-15: qty 60, 20d supply, fill #1

## 2023-06-11 NOTE — Telephone Encounter (Signed)
[place] forms received. To [employer]. 

## 2023-06-12 ENCOUNTER — Other Ambulatory Visit (HOSPITAL_COMMUNITY): Payer: Self-pay

## 2023-06-13 NOTE — Therapy (Signed)
OUTPATIENT PHYSICAL THERAPY LOWER EXTREMITY TREATMENT   Patient Name: Michelle Ray MRN: [id number] DOB:[birth year], 57 y.o., female Today's Date: 06/15/2023  END OF SESSION:  PT End of Session - 06/14/23 1616     Visit Number 3    Number of Visits 26    Date for PT Re-Evaluation 08/24/23    Authorization Type Gretta Began    PT Start Time 1540    PT Stop Time 1608    PT Time Calculation (min) 28 min    Activity Tolerance Patient tolerated treatment well    Behavior During Therapy WFL for tasks assessed/performed              Past Medical History:  Diagnosis Date   Allergy    Anemia    Aortic atherosclerosis (HCC)    Benign secondary hypertension due to renal artery stenosis (HCC)    Constipation    uses OTC stool softener prn only- this is not chronic   Diabetes mellitus without complication (HCC)    GERD (gastroesophageal reflux disease)    Hyperlipemia    Hyperlipidemia    Hypertension    Pain and swelling of right ankle 02/17/2021   Past Surgical History:  Procedure Laterality Date   ABLATION     ANKLE ARTHROSCOPY Right 05/25/2023   Procedure: RIGHT ANKLE ARTHROSCOPY WITH BROSTROM REPAIR;  Surgeon: Huel Cote, MD;  Location: [facility];  Service: Orthopedics;  Laterality: Right;   ENDOVENOUS ABLATION SAPHENOUS VEIN W/ LASER Right 01/28/2022   endovenous laser ablation right greater saphenous vein and stab phlebectomy 10-20 incisions right leg by Cari Caraway MD   TUBAL LIGATION     uterine fibroid removal     WISDOM TOOTH EXTRACTION     Patient Active Problem List   Diagnosis Date Noted   Vitamin D deficiency 06/02/2023   Right ankle instability 05/25/2023   RLS (restless legs syndrome) 03/06/2022   Fibromyalgia muscle pain 03/06/2022   Small right kidney 08/29/2021   Atrophy of right kidney 08/29/2021   Varicose veins of both legs with edema 02/17/2021   Stress incontinence in female 02/17/2021   Controlled type 2 diabetes  mellitus with complication, without long-term current use of insulin (HCC) 02/17/2021   Hyperlipidemia associated with type 2 diabetes mellitus (HCC) 02/17/2021   Right upper quadrant abdominal pain 02/17/2021   BMI 40.0-44.9, adult (HCC) 02/17/2021   Aortic atherosclerosis (HCC) 02/17/2021   Chronic thumb pain, bilateral 02/17/2021   Hypertension associated with diabetes (HCC) 05/29/2016     REFERRING PROVIDER: Huel Cote MD  REFERRING DIAG: M25.371 (ICD-10-CM) - Right ankle instability  S/p Right ankle Brostrm repair with internal brace and limited debridement  THERAPY DIAG:  Pain in right ankle and joints of right foot  Stiffness of right ankle, not elsewhere classified  Muscle weakness (generalized)  Difficulty in walking, not elsewhere classified  Rationale for Evaluation and Treatment: Rehabilitation  ONSET DATE: DOS 05/25/2023   Days since surgery: 20  SUBJECTIVE:   SUBJECTIVE STATEMENT:  Pt is 2 weeks and 6 days s/p R ankle arthroscopy with Brostrm repair with internal brace and limited debridement.  Pt denies any adverse effects after prior Rx.  Pt reports she ambulated around [place] in her boot yesterday.  She states she had some pain afterwards and used ice which helped.  Pt sees MD this Wednesday.    PERTINENT HISTORY: Right ankle arthroscopy with Brostrm repair with internal brace and limited debridement on 05/25/2023.  WBAT on R ankle  in a CAM boot L knee OA, DM type 2, and HTN   PAIN:  Are you having pain? No Location:  R ankle, worse in lateral ankle NPRS:  0/10 current, 3-4/10 worst, 1/10 best  PRECAUTIONS: Other: per surgical protocol  WEIGHT BEARING RESTRICTIONS: Yes WBAT in  CAM boot  FALLS:  Has patient fallen in last 6 months? 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  Pt is on her feet most of the day 2/3 days per week.    PLOF: Independent.  Pt was able to perform her ADLs/IADLs and functional mobility skills independently.  Pt worked out at [employer] 2 days per week performing upper/lower body strengthening and cardio.    PATIENT GOALS:  have reduced pain at work, driving, walking normally, and return to workout  NEXT MD VISIT: 06/16/2023  OBJECTIVE:   DIAGNOSTIC FINDINGS: Pt is post op. Pt had x rays and a MRI prior to surgery.  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    TODAY'S TREATMENT:                                                                                                                                OBSERVATION:  Portals and incision intact without stitches with some scabbing.    Quad sets 2x10 with 5 sec hold Supine SLR in brace 2x10 Attempted S/L hip abduction in brace though PT had pt stop that exercise due to weakness Toe flex/ext AROM 3x10 Ankle active DF ROM 2x10  Pt received gentle DF PROM w/n pt and tissue tolerance.  PT updated HEP with toe flex/ext AROM and gave pt a HEP handout.  PT instructed pt in proper positioning.  PT instructed she should not have pain with HEP.      PATIENT EDUCATION:  Education details:  dx, relevant anatomy, post op and protocol limitations and restrictions, POC, and HEP. PT instructed pt in using ice.    Person educated: Patient Education method: Explanation, Demonstration, Tactile cues, Verbal cues, and Handouts Education comprehension: verbalized understanding, returned demonstration, verbal cues required, and tactile cues required  HOME EXERCISE PROGRAM: Access Code: [email or web address] URL: [email or web address] Date: 06/01/2023 Prepared by: Aaron Edelman  Updated HEP:  Seated Toe flex/ext AROM  - 2 x daily - 7 x weekly - 2 sets - 10 rep   ASSESSMENT:  CLINICAL IMPRESSION: Pt is 2 wks and 6 days post op.  Pt is doing well at  this time.  Her portals and incisions are intact with scabbing without any signs of infection.  Pt is ambulating well in the boot.  Pt performed exercises per protocol and tolerated exercises well.  Pt attempted S/L hip abd in boot though pt had difficulty keeping her leg straight  due to weakness.  PT had pt stop that exercise.   PT gently performed DF PROM and she tolerated it well without pain.  Pt was educated in post op and protocol restrictions including motions to avoid and pt demonstrates good understanding.  She responded well to Rx having no pain after Rx.  Pt should benefit from continued skilled PT services to address impairments and goals and to assist in restoring PLOF.     OBJECTIVE IMPAIRMENTS: Abnormal gait, decreased activity tolerance, decreased endurance, decreased mobility, difficulty walking, decreased ROM, decreased strength, hypomobility, increased edema, impaired flexibility, and pain.   ACTIVITY LIMITATIONS: standing, squatting, stairs, transfers, and locomotion level  PARTICIPATION LIMITATIONS: meal prep, cleaning, driving, shopping, community activity, and occupation  PERSONAL FACTORS: 1-2 comorbidities: L knee OA, DM type 2  are also affecting patient's functional outcome.   REHAB POTENTIAL: Good  CLINICAL DECISION MAKING: Stable/uncomplicated  EVALUATION COMPLEXITY: Low   GOALS:  SHORT TERM GOALS:   Pt will be independent and compliant with HEP for improved ROM, strength, mobility, and function. Baseline: Goal status: INITIAL Target date:  07/13/2023  2.  Pt will wean out of boot as allowed by MD without adverse effects.  Baseline:  Goal status: INITIAL Target date:  07/20/2023   3.  Pt will tolerate ankle ROM as appropriate per protocol without adverse effects for improved stiffness, ROM, and mobility. Baseline:  Goal status: INITIAL Target date:  07/13/2023     LONG TERM GOALS:   Pt will demo R ankle AROM to be [facility] t/o for performance of ADLs and  IADLs. Baseline:  Goal status: INITIAL Target date:  08/17/2023   2.  Pt will ambulate with a normalized heel to toe gait pattern without limping.  Baseline:  Goal status: INITIAL Target date:  08/17/2023  3.  Pt will ambulate extended community distance without increased pain and with good stability.  Baseline:  Goal status: INITIAL Target date: 09/14/2023   4.  Pt will demo 5/5 strength in DF and eversion, WFL in PF in sitting, and 4 to 4+/5 in inversion for improved performance of functional mobility skills and work activities.  Baseline:  Goal status: INITIAL Target date: 09/14/2023  5.  Pt will be able to perform stairs with a reciprocal gait with good control and without significant difficulty.  Baseline:  Goal status: INITIAL Target date: 09/14/2023  6.  Pt will demo 10 symmetrical bilat heel raises without significant pain for improved strength.  Baseline:  Goal status: INITIAL Target date:   08/24/2023  7.  Pt will be able to perform her occupational activities as allowed by MD without adverse effects  Goal status:  INITIAL  Target date:  09/14/2023    PLAN:  PT FREQUENCY:  1x/wk initially and progress to 2x/wk  PT DURATION: other: 15 weeks  PLANNED INTERVENTIONS: Therapeutic exercises, Therapeutic activity, Neuromuscular re-education, Balance training, Gait training, Patient/Family education, Self Care, Joint mobilization, Stair training, DME instructions, Aquatic Therapy, Dry Needling, Electrical stimulation, Cryotherapy, Moist heat, scar mobilization, Taping, Ultrasound, Manual therapy, and Re-evaluation  PLAN FOR NEXT SESSION: Cont per Ankle Brostrom repair protocol and MD orders.    Audie Clear III PT, DPT 06/15/23 3:46 PM

## 2023-06-14 ENCOUNTER — Ambulatory Visit (HOSPITAL_BASED_OUTPATIENT_CLINIC_OR_DEPARTMENT_OTHER): Payer: Commercial Managed Care - PPO | Admitting: Physical Therapy

## 2023-06-14 ENCOUNTER — Encounter (HOSPITAL_BASED_OUTPATIENT_CLINIC_OR_DEPARTMENT_OTHER): Payer: Self-pay | Admitting: Physical Therapy

## 2023-06-14 DIAGNOSIS — M6281 Muscle weakness (generalized): Secondary | ICD-10-CM | POA: Diagnosis not present

## 2023-06-14 DIAGNOSIS — R262 Difficulty in walking, not elsewhere classified: Secondary | ICD-10-CM

## 2023-06-14 DIAGNOSIS — M25571 Pain in right ankle and joints of right foot: Secondary | ICD-10-CM

## 2023-06-14 DIAGNOSIS — M25671 Stiffness of right ankle, not elsewhere classified: Secondary | ICD-10-CM | POA: Diagnosis not present

## 2023-06-14 DIAGNOSIS — M25371 Other instability, right ankle: Secondary | ICD-10-CM | POA: Diagnosis not present

## 2023-06-16 ENCOUNTER — Ambulatory Visit (HOSPITAL_BASED_OUTPATIENT_CLINIC_OR_DEPARTMENT_OTHER): Payer: Commercial Managed Care - PPO | Admitting: Orthopaedic Surgery

## 2023-06-16 DIAGNOSIS — M25371 Other instability, right ankle: Secondary | ICD-10-CM

## 2023-06-16 NOTE — Progress Notes (Signed)
Post Operative Evaluation    Procedure/Date of Surgery: Right ankle Brostrm repair and ankle arthroscopy 6/25  Interval History:   Presents today for 2-week follow-up status post the above procedure.  Overall she is doing very well.  She has minimal pain.  She has been working in physical therapy with Alvino Chapel and Thurston Pounds.  She is weightbearing as tolerated in her boot.   PMH/PSH/Family History/Social History/Meds/Allergies:    Past Medical History:  Diagnosis Date   Allergy    Anemia    Aortic atherosclerosis (HCC)    Benign secondary hypertension due to renal artery stenosis (HCC)    Constipation    uses OTC stool softener prn only- this is not chronic   Diabetes mellitus without complication (HCC)    GERD (gastroesophageal reflux disease)    Hyperlipemia    Hyperlipidemia    Hypertension    Pain and swelling of right ankle 02/17/2021   Past Surgical History:  Procedure Laterality Date   ABLATION     ANKLE ARTHROSCOPY Right 05/25/2023   Procedure: RIGHT ANKLE ARTHROSCOPY WITH BROSTROM REPAIR;  Surgeon: Huel Cote, MD;  Location:  [facility];  Service: Orthopedics;  Laterality: Right;   ENDOVENOUS ABLATION SAPHENOUS VEIN W/ LASER Right 01/28/2022   endovenous laser ablation right greater saphenous vein and stab phlebectomy 10-20 incisions right leg by Cari Caraway MD   TUBAL LIGATION     uterine fibroid removal     WISDOM TOOTH EXTRACTION     Social History   Socioeconomic History   Marital status: Married    Spouse name: Minerva Areola   Number of children: Not on file   Years of education: Not on file   Highest education level: Master's degree (e.g., MA, MS, MEng, MEd, MSW, MBA)  Occupational History   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 Adult children.    Social Determinants of Health   Financial Resource Strain: Low Risk  (05/24/2023)   Overall Financial Resource Strain (CARDIA)    Difficulty of Paying Living Expenses: Not hard at all  Food Insecurity: No Food Insecurity (05/24/2023)   Hunger Vital Sign    Worried About Running Out of Food in the Last Year: Never true    Ran Out of Food in the Last Year: Never true  Transportation Needs: No Transportation Needs (05/24/2023)   PRAPARE - Administrator, Civil Service (Medical): No    Lack of Transportation (Non-Medical): No  Physical Activity: Insufficiently Active (05/24/2023)   Exercise Vital Sign    Days of Exercise per Week: 1 day    Minutes of Exercise per Session: 40 min  Stress: No Stress Concern Present (05/24/2023)   [employer] of Occupational Health - Occupational Stress Questionnaire  Feeling of Stress : Not at all  Social Connections: Socially Integrated (05/24/2023)   Social Connection and Isolation Panel [NHANES]    Frequency of Communication with Friends and Family: More than three times a week    Frequency of Social Gatherings with Friends and Family: Twice a week    Attends Religious Services: More than 4 times per year    Active Member of [employer] or Organizations: Yes    Attends Banker Meetings: 1 to 4 times per year    Marital Status: Married   Family History  Problem Relation Age of Onset   Atrial fibrillation Mother    Pulmonary embolism Mother    Breast cancer Mother 29   Heart failure Sister    Breast cancer Maternal Grandmother    Hypertension Maternal Grandmother    Heart disease Maternal Grandfather    Stroke Paternal  Grandfather    Colon cancer Neg Hx    Colon polyps Neg Hx    Allergies  Allergen Reactions   Crestor [Rosuvastatin]     Joint pain   Macrobid [Nitrofurantoin]    Pravastatin     Joint pain   Current Outpatient Medications  Medication Sig Dispense Refill   albuterol (VENTOLIN HFA) 108 (90 Base) MCG/ACT inhaler Inhale 2 puffs into the lungs every 6 (six) hours as needed for wheezing or shortness of breath (Cough). 6.7 g 0   Bempedoic Acid 180 MG TABS Take 1 tablet (180 mg) by mouth daily. 90 tablet 3   benazepril (LOTENSIN) 20 MG tablet Take 1 tablet (20 mg total) by mouth daily. 90 tablet 3   clobetasol ointment (TEMOVATE) 0.05 % Apply as directed-- twice daily x 2 weeks-- then once daily x 2 weeks-- then twice weekly 30 g 2   Continuous Glucose Sensor (FREESTYLE LIBRE 3 SENSOR) MISC Place 1 sensor on the skin every 14 days. Use to check glucose continuously 2 each 11   cyclobenzaprine (FLEXERIL) 10 MG tablet Take 1/2 - 1 tablet (5 - 10 mg total) by mouth 3 times daily as needed for muscle spasms. 60 tablet 3   DULoxetine (CYMBALTA) 20 MG capsule Take 2 capsules (40 mg total) by mouth daily. 180 capsule 3   esomeprazole (NEXIUM) 40 MG capsule Take 1 capsule (40 mg total) by mouth daily. 90 capsule 3   estradiol (ESTRACE VAGINAL) 0.1 MG/GM vaginal cream Place 1 Applicatorful vaginally nightly for 2 weeks, then twice weekly therafter 42.5 g 2   ezetimibe (ZETIA) 10 MG tablet Take 1 tablet by mouth daily. 90 tablet 3   furosemide (LASIX) 40 MG tablet Take 1 tablet (40 mg total) by mouth daily. 90 tablet 3   ondansetron (ZOFRAN) 8 MG tablet Take 1 tablet (8 mg total) by mouth every 8 (eight) hours as needed for nausea or vomiting. 30 tablet 3   Semaglutide, 2 MG/DOSE, 8 MG/3ML SOPN Inject 2 mg as directed once a week. 3 mL 3   traMADol (ULTRAM) 50 MG tablet Take 1 tablet (50 mg total) by mouth every 8 (eight) hours as needed for moderate pain or severe pain. 60 tablet 2    triamterene-hydrochlorothiazide (MAXZIDE-25) 37.5-25 MG tablet Take 1 tablet by mouth daily. 90 tablet 3   TURMERIC PO Take by mouth.     Vitamin D, Ergocalciferol, (DRISDOL) 1.25 MG (50000 UNIT) CAPS capsule Take 1 capsule (50,000 Units total) by mouth every 7 (seven) days. 12 capsule 3   No current facility-administered medications for this visit.   No  results found.  Review of Systems:   A ROS was performed including pertinent positives and negatives as documented in the HPI.   Musculoskeletal Exam:     Presents with well-appearing wound.  No erythema or drainage.  Range of motion is 20 degrees dorsiflexion plantarflexion without pain.  No residual lateral laxity.  Imaging:      I personally reviewed and interpreted the radiographs.   Assessment:   3 weeks status post right ankle Brostrm repair overall doing extremely well.  At this point she will continue to progress her weightbearing and strengthening with physical therapy.  I will plan to see her back in 4 weeks for reassessment.  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  Huel Cote, MD Attending Physician, Orthopedic Surgery  This document was dictated using Dragon voice recognition software. A reasonable attempt at proof reading has been made to minimize errors.

## 2023-06-18 ENCOUNTER — Other Ambulatory Visit (HOSPITAL_COMMUNITY): Payer: Self-pay

## 2023-06-20 NOTE — Therapy (Signed)
OUTPATIENT PHYSICAL THERAPY LOWER EXTREMITY TREATMENT   Patient Name: Michelle Ray MRN: [id number] DOB:[birth year], 57 y.o., female Today's Date: 06/22/2023  END OF SESSION:  PT End of Session - 06/21/23 1620     Visit Number 4    Number of Visits 26    Date for PT Re-Evaluation 08/24/23    Authorization Type Gretta Began    PT Start Time 1545    PT Stop Time 1612    PT Time Calculation (min) 27 min    Activity Tolerance Patient tolerated treatment well    Behavior During Therapy WFL for tasks assessed/performed               Past Medical History:  Diagnosis Date   Allergy    Anemia    Aortic atherosclerosis (HCC)    Benign secondary hypertension due to renal artery stenosis (HCC)    Constipation    uses OTC stool softener prn only- this is not chronic   Diabetes mellitus without complication (HCC)    GERD (gastroesophageal reflux disease)    Hyperlipemia    Hyperlipidemia    Hypertension    Pain and swelling of right ankle 02/17/2021   Past Surgical History:  Procedure Laterality Date   ABLATION     ANKLE ARTHROSCOPY Right 05/25/2023   Procedure: RIGHT ANKLE ARTHROSCOPY WITH BROSTROM REPAIR;  Surgeon: Huel Cote, MD;  Location: [facility];  Service: Orthopedics;  Laterality: Right;   ENDOVENOUS ABLATION SAPHENOUS VEIN W/ LASER Right 01/28/2022   endovenous laser ablation right greater saphenous vein and stab phlebectomy 10-20 incisions right leg by Cari Caraway MD   TUBAL LIGATION     uterine fibroid removal     WISDOM TOOTH EXTRACTION     Patient Active Problem List   Diagnosis Date Noted   Vitamin D deficiency 06/02/2023   Right ankle instability 05/25/2023   RLS (restless legs syndrome) 03/06/2022   Fibromyalgia muscle pain 03/06/2022   Small right kidney 08/29/2021   Atrophy of right kidney 08/29/2021   Varicose veins of both legs with edema 02/17/2021   Stress incontinence in female 02/17/2021   Controlled type 2 diabetes  mellitus with complication, without long-term current use of insulin (HCC) 02/17/2021   Hyperlipidemia associated with type 2 diabetes mellitus (HCC) 02/17/2021   Right upper quadrant abdominal pain 02/17/2021   BMI 40.0-44.9, adult (HCC) 02/17/2021   Aortic atherosclerosis (HCC) 02/17/2021   Chronic thumb pain, bilateral 02/17/2021   Hypertension associated with diabetes (HCC) 05/29/2016     REFERRING PROVIDER: Huel Cote MD  REFERRING DIAG: M25.371 (ICD-10-CM) - Right ankle instability  S/p Right ankle Brostrm repair with internal brace and limited debridement  THERAPY DIAG:  Pain in right ankle and joints of right foot  Stiffness of right ankle, not elsewhere classified  Muscle weakness (generalized)  Difficulty in walking, not elsewhere classified  Rationale for Evaluation and Treatment: Rehabilitation  ONSET DATE: DOS 05/25/2023   Days since surgery: 27  SUBJECTIVE:   SUBJECTIVE STATEMENT:  Pt is 3 weeks and 6 days s/p R ankle arthroscopy with Brostrm repair with internal brace and limited debridement.  Pt denies any adverse effects after prior Rx.  Pt saw MD on Wednesday and states MD informed her she could remove the boot.  MD returned pt to work on [employer].  Pt states that MD wants her to perform aquatic therapy.  MD was pleased with DF ROM.  Pt has been wearing her boot at work.  Pt  is ambulating without boot without adverse effects or increased pain.  Pt did have some pain in lateral and anterolateral ankle when wearing flats at church.    PERTINENT HISTORY: Right ankle arthroscopy with Brostrm repair with internal brace and limited debridement on 05/25/2023.  WBAT on R ankle in a CAM boot L knee OA, DM type 2, and HTN   PAIN:  Are you having pain? No Location:  R ankle, worse in lateral ankle NPRS:  2/10 current, 3-4/10 worst, 1/10 best  PRECAUTIONS: Other: per surgical protocol  WEIGHT BEARING RESTRICTIONS: Yes WBAT in  CAM  boot  FALLS:  Has patient fallen in last 6 months? 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    PLOF: Independent.  Pt was able to perform her ADLs/IADLs and functional mobility skills independently.  Pt worked out at [employer] 2 days per week performing upper/lower body strengthening and cardio.    PATIENT GOALS:  have reduced pain at work, driving, walking normally, and return to workout  NEXT MD VISIT: 06/16/2023  OBJECTIVE:   DIAGNOSTIC FINDINGS: Pt is post op. Pt had x rays and a MRI prior to surgery.  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    TODAY'S TREATMENT:                                                                                                                                OBSERVATION:  Portals and incision intact without stitches with some scabbing.    Supine SLR 2x10 Toe flex/ext AROM 3x10 Ankle active DF/PF AROM 3x10 Towel scrunches  Pt received gentle DF PROM w/n pt and tissue tolerance.     PATIENT EDUCATION:  Education details:  dx, relevant anatomy, post op and protocol limitations and restrictions, POC, and HEP. PT instructed pt in using ice.    Person educated: Patient Education method: Explanation, Demonstration, Tactile cues, Verbal cues, and Handouts Education comprehension: verbalized understanding, returned demonstration, verbal cues required, and tactile cues required  HOME EXERCISE PROGRAM: Access Code: [email or web address] URL: [email or web address] Date: 06/01/2023 Prepared by: Aaron Edelman    ASSESSMENT:  CLINICAL IMPRESSION: Pt reports MD informed her she could remove her boot and she is ambulating without boot without adverse effects or increased pain.  Pt has  returned to administrative duty and has been wearing her boot at work.  Pt is progressing appropriately with protocol and is improving with ROM per protocol.  PT performed PROM per protocol and pt performed AROM per protocol without any c/o's or pain.  She performed exercises well without any c/o's.  She responded well to Rx having no increased pain after Rx.  Pt should benefit  from continued skilled PT services to address impairments and goals and to assist in restoring PLOF.     OBJECTIVE IMPAIRMENTS: Abnormal gait, decreased activity tolerance, decreased endurance, decreased mobility, difficulty walking, decreased ROM, decreased strength, hypomobility, increased edema, impaired flexibility, and pain.   ACTIVITY LIMITATIONS: standing, squatting, stairs, transfers, and locomotion level  PARTICIPATION LIMITATIONS: meal prep, cleaning, driving, shopping, community activity, and occupation  PERSONAL FACTORS: 1-2 comorbidities: L knee OA, DM type 2  are also affecting patient's functional outcome.   REHAB POTENTIAL: Good  CLINICAL DECISION MAKING: Stable/uncomplicated  EVALUATION COMPLEXITY: Low   GOALS:  SHORT TERM GOALS:   Pt will be independent and compliant with HEP for improved ROM, strength, mobility, and function. Baseline: Goal status: INITIAL Target date:  07/13/2023  2.  Pt will wean out of boot as allowed by MD without adverse effects.  Baseline:  Goal status: INITIAL Target date:  07/20/2023   3.  Pt will tolerate ankle ROM as appropriate per protocol without adverse effects for improved stiffness, ROM, and mobility. Baseline:  Goal status: INITIAL Target date:  07/13/2023     LONG TERM GOALS:   Pt will demo R ankle AROM to be [facility] t/o for performance of ADLs and IADLs. Baseline:  Goal status: INITIAL Target date:  08/17/2023   2.  Pt will ambulate with a normalized heel to toe gait pattern without limping.  Baseline:  Goal status: INITIAL Target date:   08/17/2023  3.  Pt will ambulate extended community distance without increased pain and with good stability.  Baseline:  Goal status: INITIAL Target date: 09/14/2023   4.  Pt will demo 5/5 strength in DF and eversion, WFL in PF in sitting, and 4 to 4+/5 in inversion for improved performance of functional mobility skills and work activities.  Baseline:  Goal status: INITIAL Target date: 09/14/2023  5.  Pt will be able to perform stairs with a reciprocal gait with good control and without significant difficulty.  Baseline:  Goal status: INITIAL Target date: 09/14/2023  6.  Pt will demo 10 symmetrical bilat heel raises without significant pain for improved strength.  Baseline:  Goal status: INITIAL Target date:   08/24/2023  7.  Pt will be able to perform her occupational activities as allowed by MD without adverse effects  Goal status:  INITIAL  Target date:  09/14/2023    PLAN:  PT FREQUENCY:  1x/wk initially and progress to 2x/wk  PT DURATION: other: 15 weeks  PLANNED INTERVENTIONS: Therapeutic exercises, Therapeutic activity, Neuromuscular re-education, Balance training, Gait training, Patient/Family education, Self Care, Joint mobilization, Stair training, DME instructions, Aquatic Therapy, Dry Needling, Electrical stimulation, Cryotherapy, Moist heat, scar mobilization, Taping, Ultrasound, Manual therapy, and Re-evaluation  PLAN FOR NEXT SESSION: Cont per Ankle Brostrom repair protocol and MD orders.    Audie Clear III PT, DPT 06/22/23 10:27 PM

## 2023-06-21 ENCOUNTER — Other Ambulatory Visit (HOSPITAL_COMMUNITY): Payer: Self-pay

## 2023-06-21 ENCOUNTER — Ambulatory Visit (HOSPITAL_BASED_OUTPATIENT_CLINIC_OR_DEPARTMENT_OTHER): Payer: Commercial Managed Care - PPO | Admitting: Physical Therapy

## 2023-06-21 DIAGNOSIS — M25671 Stiffness of right ankle, not elsewhere classified: Secondary | ICD-10-CM | POA: Diagnosis not present

## 2023-06-21 DIAGNOSIS — M25571 Pain in right ankle and joints of right foot: Secondary | ICD-10-CM | POA: Diagnosis not present

## 2023-06-21 DIAGNOSIS — M6281 Muscle weakness (generalized): Secondary | ICD-10-CM

## 2023-06-21 DIAGNOSIS — R262 Difficulty in walking, not elsewhere classified: Secondary | ICD-10-CM

## 2023-06-21 DIAGNOSIS — M25371 Other instability, right ankle: Secondary | ICD-10-CM | POA: Diagnosis not present

## 2023-06-22 ENCOUNTER — Encounter (HOSPITAL_BASED_OUTPATIENT_CLINIC_OR_DEPARTMENT_OTHER): Payer: Self-pay | Admitting: Physical Therapy

## 2023-06-26 ENCOUNTER — Other Ambulatory Visit: Payer: Self-pay

## 2023-06-28 ENCOUNTER — Encounter (HOSPITAL_BASED_OUTPATIENT_CLINIC_OR_DEPARTMENT_OTHER): Payer: Self-pay | Admitting: Physical Therapy

## 2023-06-28 ENCOUNTER — Ambulatory Visit (HOSPITAL_BASED_OUTPATIENT_CLINIC_OR_DEPARTMENT_OTHER): Payer: Commercial Managed Care - PPO | Admitting: Physical Therapy

## 2023-06-28 DIAGNOSIS — M6281 Muscle weakness (generalized): Secondary | ICD-10-CM

## 2023-06-28 DIAGNOSIS — M25571 Pain in right ankle and joints of right foot: Secondary | ICD-10-CM | POA: Diagnosis not present

## 2023-06-28 DIAGNOSIS — M25371 Other instability, right ankle: Secondary | ICD-10-CM | POA: Diagnosis not present

## 2023-06-28 DIAGNOSIS — M25671 Stiffness of right ankle, not elsewhere classified: Secondary | ICD-10-CM | POA: Diagnosis not present

## 2023-06-28 DIAGNOSIS — R262 Difficulty in walking, not elsewhere classified: Secondary | ICD-10-CM | POA: Diagnosis not present

## 2023-06-28 NOTE — Therapy (Signed)
OUTPATIENT PHYSICAL THERAPY LOWER EXTREMITY TREATMENT   Patient Name: Michelle Ray MRN: [id number] DOB:03/11/1966, 57 y.o., female Today's Date: 06/28/2023  END OF SESSION:  PT End of Session - 06/28/23 1536     Visit Number 5    Number of Visits 26    Date for PT Re-Evaluation 08/24/23    Authorization Type Gretta Began    PT Start Time 1538   late   PT Stop Time 1610    PT Time Calculation (min) 32 min    Activity Tolerance Patient tolerated treatment well    Behavior During Therapy WFL for tasks assessed/performed               Past Medical History:  Diagnosis Date   Allergy    Anemia    Aortic atherosclerosis (HCC)    Benign secondary hypertension due to renal artery stenosis (HCC)    Constipation    uses OTC stool softener prn only- this is not chronic   Diabetes mellitus without complication (HCC)    GERD (gastroesophageal reflux disease)    Hyperlipemia    Hyperlipidemia    Hypertension    Pain and swelling of right ankle 02/17/2021   Past Surgical History:  Procedure Laterality Date   ABLATION     ANKLE ARTHROSCOPY Right 05/25/2023   Procedure: RIGHT ANKLE ARTHROSCOPY WITH BROSTROM REPAIR;  Surgeon: Huel Cote, MD;  Location: [facility];  Service: Orthopedics;  Laterality: Right;   ENDOVENOUS ABLATION SAPHENOUS VEIN W/ LASER Right 01/28/2022   endovenous laser ablation right greater saphenous vein and stab phlebectomy 10-20 incisions right leg by Cari Caraway MD   TUBAL LIGATION     uterine fibroid removal     WISDOM TOOTH EXTRACTION     Patient Active Problem List   Diagnosis Date Noted   Vitamin D deficiency 06/02/2023   Right ankle instability 05/25/2023   RLS (restless legs syndrome) 03/06/2022   Fibromyalgia muscle pain 03/06/2022   Small right kidney 08/29/2021   Atrophy of right kidney 08/29/2021   Varicose veins of both legs with edema 02/17/2021   Stress incontinence in female 02/17/2021   Controlled type 2  diabetes mellitus with complication, without long-term current use of insulin (HCC) 02/17/2021   Hyperlipidemia associated with type 2 diabetes mellitus (HCC) 02/17/2021   Right upper quadrant abdominal pain 02/17/2021   BMI 40.0-44.9, adult (HCC) 02/17/2021   Aortic atherosclerosis (HCC) 02/17/2021   Chronic thumb pain, bilateral 02/17/2021   Hypertension associated with diabetes (HCC) 05/29/2016     REFERRING PROVIDER: Huel Cote MD  REFERRING DIAG: M25.371 (ICD-10-CM) - Right ankle instability  S/p Right ankle Brostrm repair with internal brace and limited debridement  THERAPY DIAG:  Pain in right ankle and joints of right foot  Stiffness of right ankle, not elsewhere classified  Muscle weakness (generalized)  Difficulty in walking, not elsewhere classified  Rationale for Evaluation and Treatment: Rehabilitation  ONSET DATE: DOS 05/25/2023   Days since surgery: 34  SUBJECTIVE:   SUBJECTIVE STATEMENT:  Pt did have some pain in lateral and anterolateral ankle still when she walks to much. No issues otherwise.    PERTINENT HISTORY: Right ankle arthroscopy with Brostrm repair with internal brace and limited debridement on 05/25/2023.  WBAT on R ankle in a CAM boot L knee OA, DM type 2, and HTN   PAIN:  Are you having pain? No Location:  R ankle, worse in lateral ankle NPRS:  2/10 current, 3-4/10 worst, 1/10 best  PRECAUTIONS: Other: per surgical protocol  WEIGHT BEARING RESTRICTIONS: Yes WBAT in  CAM boot  FALLS:  Has patient fallen in last 6 months? 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    PLOF: Independent.  Pt was able to perform her ADLs/IADLs and functional mobility skills independently.  Pt worked  out at [employer] 2 days per week performing upper/lower body strengthening and cardio.    PATIENT GOALS:  have reduced pain at work, driving, walking normally, and return to workout  NEXT MD VISIT: 06/16/2023  OBJECTIVE:   DIAGNOSTIC FINDINGS: Pt is post op. Pt had x rays and a MRI prior to surgery.  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    TODAY'S TREATMENT:                                                                                                                                Ankle brace usage   Exercises - Towel Scrunches  - 2 x daily - 7 x weekly - 3 sets - 10 reps - Gastroc Stretch on Wall  - 2 x daily - 7 x weekly - 1 sets - 3 reps - 30 hold - Woodpeckers Two Legs  - 1 x daily - 7 x weekly - 2 sets - 10 reps - 2 hold - Heel Raise/Toe Raise  - 1 x daily - 7 x weekly - 2 sets - 10 reps - Step Up  - 1 x daily - 7 x weekly - 3 sets - 8 reps  DF PROM to tolerance.    PATIENT EDUCATION:  Education details:  post op and protocol limitations and restrictions, POC, and HEP Person educated: Patient Education method: Explanation, Demonstration, Tactile cues, Verbal cues, and Handouts Education comprehension: verbalized understanding, returned demonstration, verbal cues required, and tactile cues required  HOME EXERCISE PROGRAM: Access Code: [email or web address] URL: [email or web address] Date: 06/01/2023 Prepared by: Aaron Edelman    ASSESSMENT:  CLINICAL IMPRESSION: Patient with 3 degrees of ankle DF at today's session following passive range of motion and manual therapy.  Patient was able to progress to weightbearing type exercise today without increasing pain.  Patient advised to use lace up ankle brace for extended periods of standing and outdoor activity to help with the transition from cam boot.  Home exercise program updated.  Consider more LE based strength exercise at future sessions. Pt orogressing very well with PT but with mild antalgic gait that  is likely due to lack of ankle ROM and PF strength. pt should benefit from continued skilled PT services to address impairments and goals and to assist in restoring PLOF.     OBJECTIVE IMPAIRMENTS: Abnormal  gait, decreased activity tolerance, decreased endurance, decreased mobility, difficulty walking, decreased ROM, decreased strength, hypomobility, increased edema, impaired flexibility, and pain.   ACTIVITY LIMITATIONS: standing, squatting, stairs, transfers, and locomotion level  PARTICIPATION LIMITATIONS: meal prep, cleaning, driving, shopping, community activity, and occupation  PERSONAL FACTORS: 1-2 comorbidities: L knee OA, DM type 2  are also affecting patient's functional outcome.   REHAB POTENTIAL: Good  CLINICAL DECISION MAKING: Stable/uncomplicated  EVALUATION COMPLEXITY: Low   GOALS:  SHORT TERM GOALS:   Pt will be independent and compliant with HEP for improved ROM, strength, mobility, and function. Baseline: Goal status: INITIAL Target date:  07/13/2023  2.  Pt will wean out of boot as allowed by MD without adverse effects.  Baseline:  Goal status: INITIAL Target date:  07/20/2023   3.  Pt will tolerate ankle ROM as appropriate per protocol without adverse effects for improved stiffness, ROM, and mobility. Baseline:  Goal status: INITIAL Target date:  07/13/2023     LONG TERM GOALS:   Pt will demo R ankle AROM to be [facility] t/o for performance of ADLs and IADLs. Baseline:  Goal status: INITIAL Target date:  08/17/2023   2.  Pt will ambulate with a normalized heel to toe gait pattern without limping.  Baseline:  Goal status: INITIAL Target date:  08/17/2023  3.  Pt will ambulate extended community distance without increased pain and with good stability.  Baseline:  Goal status: INITIAL Target date: 09/14/2023   4.  Pt will demo 5/5 strength in DF and eversion, WFL in PF in sitting, and 4 to 4+/5 in inversion for improved performance of functional  mobility skills and work activities.  Baseline:  Goal status: INITIAL Target date: 09/14/2023  5.  Pt will be able to perform stairs with a reciprocal gait with good control and without significant difficulty.  Baseline:  Goal status: INITIAL Target date: 09/14/2023  6.  Pt will demo 10 symmetrical bilat heel raises without significant pain for improved strength.  Baseline:  Goal status: INITIAL Target date:   08/24/2023  7.  Pt will be able to perform her occupational activities as allowed by MD without adverse effects  Goal status:  INITIAL  Target date:  09/14/2023    PLAN:  PT FREQUENCY:  1x/wk initially and progress to 2x/wk  PT DURATION: other: 15 weeks  PLANNED INTERVENTIONS: Therapeutic exercises, Therapeutic activity, Neuromuscular re-education, Balance training, Gait training, Patient/Family education, Self Care, Joint mobilization, Stair training, DME instructions, Aquatic Therapy, Dry Needling, Electrical stimulation, Cryotherapy, Moist heat, scar mobilization, Taping, Ultrasound, Manual therapy, and Re-evaluation  PLAN FOR NEXT SESSION: Cont per Ankle Brostrom repair protocol and MD orders.    Zebedee Iba PT, DPT 06/28/23 4:15 PM

## 2023-06-29 ENCOUNTER — Encounter: Payer: Self-pay | Admitting: Nurse Practitioner

## 2023-06-29 DIAGNOSIS — N952 Postmenopausal atrophic vaginitis: Secondary | ICD-10-CM | POA: Insufficient documentation

## 2023-06-29 DIAGNOSIS — K219 Gastro-esophageal reflux disease without esophagitis: Secondary | ICD-10-CM | POA: Insufficient documentation

## 2023-06-29 DIAGNOSIS — R4189 Other symptoms and signs involving cognitive functions and awareness: Secondary | ICD-10-CM | POA: Insufficient documentation

## 2023-06-29 DIAGNOSIS — R600 Localized edema: Secondary | ICD-10-CM | POA: Insufficient documentation

## 2023-06-29 NOTE — Assessment & Plan Note (Signed)
Successful weight management with diet, exercise, and addition of GLP-1/GIP therapy. She is doing very well. Plan: - Continue current therapy and lifestyle changes

## 2023-06-29 NOTE — Assessment & Plan Note (Signed)
Blood pressure well-controlled on current medications during today's visit. No associated symptoms reported. Plan: - Continue current antihypertensive medications - Encourage home blood pressure monitoring

## 2023-06-29 NOTE — Assessment & Plan Note (Signed)
Chronic management of statins and blood sugars in place with weight management.  Plan: - Continue current therapy.

## 2023-06-29 NOTE — Assessment & Plan Note (Signed)
Patient scheduled for right ankle surgery to clean up joint and reattach ligament. Plan: - Follow up with surgeon as needed

## 2023-06-29 NOTE — Assessment & Plan Note (Signed)
Historical lipid elevation in the setting of diabetes. Patient has ben unable to tolerate statin therapy, but is managed with zetia. We could consider low dose rosuvastatin starting once a week and increasing to goal of 3 days a week as tolerated if labs indicate. Will monitor labs and determine risks and we can discuss as needed.  Plan: - Continue with diet and blood sugar control - Continue zetia - Increase fiber to help reduce lipids

## 2023-06-29 NOTE — Assessment & Plan Note (Signed)
Using estradiol inconsistently per patient report. Plan: - Encourage consistent use of estradiol routinely for best effect - Consider utilizing Aquaphor topically to vaginal introitus and perineal area to assist in vaginal moisture - Monitor for symptom/side effect changes

## 2023-06-29 NOTE — Assessment & Plan Note (Signed)
Patient is currently managed on Mounjaro for type 2 diabetes but is transitioning to Ozempic with her next medication change due to supply issues. Blood glucose levels are reported to be in the range of 75-145 mg/dL. She has had some hypoglycemia, most likely related to missed meals. No other side effects noted.  Plan: - Transition to Ozempic with next medication fill and monitor closely. If medication is not effective, let us know and we can transition back to [facility].  - Monitor blood glucose levels and report any concerns or side effects - Ensure you are not skipping meals and keep snacks available for management of hypoglycemia

## 2023-06-29 NOTE — Assessment & Plan Note (Signed)
Pain fairly well managed at this time with cymbalta and PRN tramadol. I am hopeful that following ankle surgery she will have improvement of symptoms with routine exercise.  Plan: - continue current medication management - work to slowly increase activity following ankle surgery

## 2023-06-29 NOTE — Assessment & Plan Note (Signed)
Patient reports brain fog, possibly related to menopause. No thyroid symptoms present. Plan:   - Order labs to check vitamin B, D, and thyroid levels - Consider trial of Provigil if labs normal and symptoms persist

## 2023-07-01 ENCOUNTER — Encounter (HOSPITAL_BASED_OUTPATIENT_CLINIC_OR_DEPARTMENT_OTHER): Payer: Commercial Managed Care - PPO | Admitting: Physical Therapy

## 2023-07-05 ENCOUNTER — Encounter (HOSPITAL_BASED_OUTPATIENT_CLINIC_OR_DEPARTMENT_OTHER): Payer: Commercial Managed Care - PPO | Admitting: Physical Therapy

## 2023-07-07 ENCOUNTER — Other Ambulatory Visit (HOSPITAL_COMMUNITY): Payer: Self-pay

## 2023-07-07 MED ORDER — NEXLETOL 180 MG PO TABS
180.0000 mg | ORAL_TABLET | Freq: Every day | ORAL | 3 refills | Status: DC
Start: 2023-07-07 — End: 2023-11-12
  Filled 2023-07-07: qty 90, 90d supply, fill #0

## 2023-07-07 NOTE — Addendum Note (Signed)
Addended by: , Huntley Dec E on: 07/07/2023 12:09 PM   Modules accepted: Orders

## 2023-07-08 ENCOUNTER — Ambulatory Visit (HOSPITAL_BASED_OUTPATIENT_CLINIC_OR_DEPARTMENT_OTHER): Payer: Commercial Managed Care - PPO | Attending: Orthopaedic Surgery | Admitting: Physical Therapy

## 2023-07-08 ENCOUNTER — Encounter (HOSPITAL_BASED_OUTPATIENT_CLINIC_OR_DEPARTMENT_OTHER): Payer: Self-pay | Admitting: Physical Therapy

## 2023-07-08 DIAGNOSIS — R262 Difficulty in walking, not elsewhere classified: Secondary | ICD-10-CM | POA: Insufficient documentation

## 2023-07-08 DIAGNOSIS — M25671 Stiffness of right ankle, not elsewhere classified: Secondary | ICD-10-CM | POA: Insufficient documentation

## 2023-07-08 DIAGNOSIS — M6281 Muscle weakness (generalized): Secondary | ICD-10-CM | POA: Diagnosis not present

## 2023-07-08 DIAGNOSIS — M25571 Pain in right ankle and joints of right foot: Secondary | ICD-10-CM | POA: Insufficient documentation

## 2023-07-08 NOTE — Therapy (Signed)
OUTPATIENT PHYSICAL THERAPY LOWER EXTREMITY TREATMENT   Patient Name: Michelle Ray MRN: [id number] DOB:[birth year], 57 y.o., female Today's Date: 07/08/2023  END OF SESSION:  PT End of Session - 07/08/23 1537     Visit Number 6    Number of Visits 26    Date for PT Re-Evaluation 08/24/23    Authorization Type Gretta Began    PT Start Time 1536   arrives late   PT Stop Time 1607    PT Time Calculation (min) 31 min    Activity Tolerance Patient tolerated treatment well    Behavior During Therapy WFL for tasks assessed/performed               Past Medical History:  Diagnosis Date   Allergy    Anemia    Aortic atherosclerosis (HCC)    Benign secondary hypertension due to renal artery stenosis (HCC)    Constipation    uses OTC stool softener prn only- this is not chronic   Diabetes mellitus without complication (HCC)    GERD (gastroesophageal reflux disease)    Hyperlipemia    Hyperlipidemia    Hypertension    Pain and swelling of right ankle 02/17/2021   Right upper quadrant abdominal pain 02/17/2021   Past Surgical History:  Procedure Laterality Date   ABLATION     ANKLE ARTHROSCOPY Right 05/25/2023   Procedure: RIGHT ANKLE ARTHROSCOPY WITH BROSTROM REPAIR;  Surgeon: Huel Cote, MD;  Location: [facility];  Service: Orthopedics;  Laterality: Right;   ENDOVENOUS ABLATION SAPHENOUS VEIN W/ LASER Right 01/28/2022   endovenous laser ablation right greater saphenous vein and stab phlebectomy 10-20 incisions right leg by Cari Caraway MD   TUBAL LIGATION     uterine fibroid removal     WISDOM TOOTH EXTRACTION     Patient Active Problem List   Diagnosis Date Noted   Brain fog 06/29/2023   Gastroesophageal reflux disease 06/29/2023   Bilateral leg edema 06/29/2023   Vaginal atrophy 06/29/2023   Vitamin D deficiency 06/02/2023   Right ankle instability 05/25/2023   RLS (restless legs syndrome) 03/06/2022   Fibromyalgia muscle pain 03/06/2022    Atrophy of right kidney 08/29/2021   Varicose veins of both legs with edema 02/17/2021   Stress incontinence in female 02/17/2021   Controlled type 2 diabetes mellitus with complication, without long-term current use of insulin (HCC) 02/17/2021   Hyperlipidemia associated with type 2 diabetes mellitus (HCC) 02/17/2021   BMI 40.0-44.9, adult (HCC) 02/17/2021   Aortic atherosclerosis (HCC) 02/17/2021   Chronic thumb pain, bilateral 02/17/2021   Hypertension associated with diabetes (HCC) 05/29/2016     REFERRING PROVIDER: Huel Cote MD  REFERRING DIAG: M25.371 (ICD-10-CM) - Right ankle instability  S/p Right ankle Brostrm repair with internal brace and limited debridement  THERAPY DIAG:  Pain in right ankle and joints of right foot  Stiffness of right ankle, not elsewhere classified  Difficulty in walking, not elsewhere classified  Muscle weakness (generalized)  Rationale for Evaluation and Treatment: Rehabilitation  ONSET DATE: DOS 05/25/2023   Days since surgery: 44  SUBJECTIVE:   SUBJECTIVE STATEMENT:  Pt denies discomfort in the ankle since last session. No soreness after last session. Mild swelling with increasing walking/steps.  6K steps in a day.    PERTINENT HISTORY: Right ankle arthroscopy with Brostrm repair with internal brace and limited debridement on 05/25/2023.  WBAT on R ankle in a CAM boot L knee OA, DM type 2, and HTN   PAIN:  Are you having pain? No Location:  R ankle, worse in lateral ankle NPRS:  2/10 current, 3-4/10 worst, 1/10 best  PRECAUTIONS: Other: per surgical protocol  WEIGHT BEARING RESTRICTIONS: Yes WBAT in  CAM boot  FALLS:  Has patient fallen in last 6 months? 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  Pt is on her feet most of  the day 2/3 days per week.    PLOF: Independent.  Pt was able to perform her ADLs/IADLs and functional mobility skills independently.  Pt worked out at [employer] 2 days per week performing upper/lower body strengthening and cardio.    PATIENT GOALS:  have reduced pain at work, driving, walking normally, and return to workout  NEXT MD VISIT: 06/16/2023  OBJECTIVE:   DIAGNOSTIC FINDINGS: Pt is post op. Pt had x rays and a MRI prior to surgery.  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    TODAY'S TREATMENT:         8/8  Recumbent bike warm up 5 min   Staggered stance STS 3x8 Tandem balance 30s 3x  Ankle alphabet 3x  Wobble board 2x20 AP and ML Retro walking 3x laps at bar  HR at step 3s 2x15-20    Previous:                                                                                                                           Ankle brace usage   Exercises - Towel Scrunches  - 2 x daily - 7 x weekly - 3 sets - 10 reps - Gastroc Stretch on Wall  - 2 x daily - 7 x weekly - 1 sets - 3 reps - 30 hold - Woodpeckers Two Legs  - 1 x daily - 7 x weekly - 2 sets - 10 reps - 2 hold - Heel Raise/Toe Raise  - 1 x daily - 7 x weekly - 2 sets - 10 reps - Step Up  - 1 x daily - 7 x weekly - 3 sets - 8 reps  DF PROM to tolerance.    PATIENT EDUCATION:  Education details:  post op and protocol limitations and restrictions, POC, and HEP Person educated: Patient Education method: Explanation, Demonstration, Tactile cues, Verbal cues, and Handouts Education comprehension: verbalized understanding, returned demonstration, verbal cues required, and tactile cues required  HOME EXERCISE PROGRAM: Access Code: [email or web address] URL: [email or web address] Date: 06/01/2023 Prepared by: Aaron Edelman    ASSESSMENT:  CLINICAL IMPRESSION: Pt 6 wks post up. Pt with more normalized gait at today's session and significant improvement in functional R ankle DF PROM. CKC DF >15 deg  at this time. Pt with mild tightness with AROM IV/EV introduced today but without pain. Pt advised to continue with brace usage but can begin  weaning in the next 2 wks. Plan to continue with progression of R ankle stability. Pt with well managed pain and likely able to start pliant surface training as well as more SL activity. Plan to continue with therapy at 1x/week moving forward. Pt to see MD in 1 wk for f/u visit. pt should benefit from continued skilled PT services to address impairments and goals and to assist in restoring PLOF.     OBJECTIVE IMPAIRMENTS: Abnormal gait, decreased activity tolerance, decreased endurance, decreased mobility, difficulty walking, decreased ROM, decreased strength, hypomobility, increased edema, impaired flexibility, and pain.   ACTIVITY LIMITATIONS: standing, squatting, stairs, transfers, and locomotion level  PARTICIPATION LIMITATIONS: meal prep, cleaning, driving, shopping, community activity, and occupation  PERSONAL FACTORS: 1-2 comorbidities: L knee OA, DM type 2  are also affecting patient's functional outcome.   REHAB POTENTIAL: Good  CLINICAL DECISION MAKING: Stable/uncomplicated  EVALUATION COMPLEXITY: Low   GOALS:  SHORT TERM GOALS:   Pt will be independent and compliant with HEP for improved ROM, strength, mobility, and function. Baseline: Goal status: INITIAL Target date:  07/13/2023  2.  Pt will wean out of boot as allowed by MD without adverse effects.  Baseline:  Goal status: INITIAL Target date:  07/20/2023   3.  Pt will tolerate ankle ROM as appropriate per protocol without adverse effects for improved stiffness, ROM, and mobility. Baseline:  Goal status: INITIAL Target date:  07/13/2023     LONG TERM GOALS:   Pt will demo R ankle AROM to be [facility] t/o for performance of ADLs and IADLs. Baseline:  Goal status: INITIAL Target date:  08/17/2023   2.  Pt will ambulate with a normalized heel to toe gait pattern without  limping.  Baseline:  Goal status: INITIAL Target date:  08/17/2023  3.  Pt will ambulate extended community distance without increased pain and with good stability.  Baseline:  Goal status: INITIAL Target date: 09/14/2023   4.  Pt will demo 5/5 strength in DF and eversion, WFL in PF in sitting, and 4 to 4+/5 in inversion for improved performance of functional mobility skills and work activities.  Baseline:  Goal status: INITIAL Target date: 09/14/2023  5.  Pt will be able to perform stairs with a reciprocal gait with good control and without significant difficulty.  Baseline:  Goal status: INITIAL Target date: 09/14/2023  6.  Pt will demo 10 symmetrical bilat heel raises without significant pain for improved strength.  Baseline:  Goal status: INITIAL Target date:   08/24/2023  7.  Pt will be able to perform her occupational activities as allowed by MD without adverse effects  Goal status:  INITIAL  Target date:  09/14/2023    PLAN:  PT FREQUENCY:  1x/wk initially and progress to 2x/wk  PT DURATION: other: 15 weeks  PLANNED INTERVENTIONS: Therapeutic exercises, Therapeutic activity, Neuromuscular re-education, Balance training, Gait training, Patient/Family education, Self Care, Joint mobilization, Stair training, DME instructions, Aquatic Therapy, Dry Needling, Electrical stimulation, Cryotherapy, Moist heat, scar mobilization, Taping, Ultrasound, Manual therapy, and Re-evaluation  PLAN FOR NEXT SESSION: Cont per Ankle Brostrom repair protocol and MD orders.    Zebedee Iba PT, DPT 07/08/23 4:10 PM

## 2023-07-10 ENCOUNTER — Other Ambulatory Visit (HOSPITAL_COMMUNITY): Payer: Self-pay

## 2023-07-12 ENCOUNTER — Other Ambulatory Visit: Payer: Self-pay

## 2023-07-13 ENCOUNTER — Encounter: Payer: Self-pay | Admitting: Nurse Practitioner

## 2023-07-13 DIAGNOSIS — E1169 Type 2 diabetes mellitus with other specified complication: Secondary | ICD-10-CM

## 2023-07-13 DIAGNOSIS — I700 Atherosclerosis of aorta: Secondary | ICD-10-CM

## 2023-07-13 DIAGNOSIS — Z6841 Body Mass Index (BMI) 40.0 and over, adult: Secondary | ICD-10-CM

## 2023-07-13 DIAGNOSIS — I152 Hypertension secondary to endocrine disorders: Secondary | ICD-10-CM

## 2023-07-13 DIAGNOSIS — E118 Type 2 diabetes mellitus with unspecified complications: Secondary | ICD-10-CM

## 2023-07-14 ENCOUNTER — Ambulatory Visit (INDEPENDENT_AMBULATORY_CARE_PROVIDER_SITE_OTHER): Payer: Commercial Managed Care - PPO | Admitting: Orthopaedic Surgery

## 2023-07-14 DIAGNOSIS — M25371 Other instability, right ankle: Secondary | ICD-10-CM

## 2023-07-14 NOTE — Progress Notes (Signed)
Post Operative Evaluation    Procedure/Date of Surgery: Right ankle Brostrm repair and ankle arthroscopy 6/25  Interval History:   Presents today for follow-up of the above procedure.  She does feel like she recently overdid it with physical therapy and has regressed somewhat.  That being said she is now improving.  She does have some soreness about the anterior lateral ankle near the repair area.  She is walking without any type of cam boot   PMH/PSH/Family History/Social History/Meds/Allergies:    Past Medical History:  Diagnosis Date   Allergy    Anemia    Aortic atherosclerosis (HCC)    Benign secondary hypertension due to renal artery stenosis (HCC)    Constipation    uses OTC stool softener prn only- this is not chronic   Diabetes mellitus without complication (HCC)    GERD (gastroesophageal reflux disease)    Hyperlipemia    Hyperlipidemia    Hypertension    Pain and swelling of right ankle 02/17/2021   Right upper quadrant abdominal pain 02/17/2021   Past Surgical History:  Procedure Laterality Date   ABLATION     ANKLE ARTHROSCOPY Right 05/25/2023   Procedure: RIGHT ANKLE ARTHROSCOPY WITH BROSTROM REPAIR;  Surgeon: Huel Cote, MD;  Location: [facility];  Service: Orthopedics;  Laterality: Right;   ENDOVENOUS ABLATION SAPHENOUS VEIN W/ LASER Right 01/28/2022   endovenous laser ablation right greater saphenous vein and stab phlebectomy 10-20 incisions right leg by Cari Caraway MD   TUBAL LIGATION     uterine fibroid removal     WISDOM TOOTH EXTRACTION     Social History   Socioeconomic History   Marital status: Married    Spouse name: Minerva Areola   Number of children: Not on file   Years of education: Not on file   Highest education level: Master's degree (e.g., MA, MS, MEng, MEd, MSW, MBA)  Occupational History   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 Married to [place]. Adult children.    Social Determinants of Health   Financial Resource Strain: Low Risk  (05/24/2023)   Overall Financial Resource Strain (CARDIA)    Difficulty of Paying Living Expenses: Not hard at all  Food Insecurity: No Food Insecurity (05/24/2023)   Hunger Vital Sign    Worried About Running Out of Food in the Last Year: Never true    Ran Out of Food in the Last Year: Never true  Transportation Needs: No Transportation Needs (05/24/2023)   PRAPARE - Administrator, Civil Service (Medical): No    Lack of Transportation (Non-Medical): No  Physical Activity: Insufficiently Active (05/24/2023)   Exercise Vital Sign    Days of Exercise per Week: 1 day    Minutes of Exercise per  Session: 40 min  Stress: No Stress Concern Present (05/24/2023)   [employer] of Occupational Health - Occupational Stress Questionnaire    Feeling of Stress : Not at all  Social Connections: Socially Integrated (05/24/2023)   Social Connection and Isolation Panel [NHANES]    Frequency of Communication with Friends and Family: More than three times a week    Frequency of Social Gatherings with Friends and Family: Twice a week    Attends Religious Services: More than 4 times per year    Active Member of [employer] or Organizations: Yes    Attends Engineer, structural: 1 to 4 times per year    Marital Status: Married   Family History  Problem Relation Age of Onset   Atrial fibrillation Mother    Pulmonary embolism Mother    Breast cancer Mother 2   Heart failure Sister     Breast cancer Maternal Grandmother    Hypertension Maternal Grandmother    Heart disease Maternal Grandfather    Stroke Paternal Grandfather    Colon cancer Neg Hx    Colon polyps Neg Hx    Allergies  Allergen Reactions   Crestor [Rosuvastatin]     Joint pain   Macrobid [Nitrofurantoin]    Pravastatin     Joint pain   Current Outpatient Medications  Medication Sig Dispense Refill   albuterol (VENTOLIN HFA) 108 (90 Base) MCG/ACT inhaler Inhale 2 puffs into the lungs every 6 (six) hours as needed for wheezing or shortness of breath (Cough). 6.7 g 0   Bempedoic Acid (NEXLETOL) 180 MG TABS Take 1 tablet (180 mg total) by mouth daily at 6 (six) AM. 90 tablet 3   benazepril (LOTENSIN) 20 MG tablet Take 1 tablet (20 mg total) by mouth daily. 90 tablet 3   clobetasol ointment (TEMOVATE) 0.05 % Apply as directed-- twice daily x 2 weeks-- then once daily x 2 weeks-- then twice weekly 30 g 2   Continuous Glucose Sensor (FREESTYLE LIBRE 3 SENSOR) MISC Place 1 sensor on the skin every 14 days. Use to check glucose continuously 2 each 11   cyclobenzaprine (FLEXERIL) 10 MG tablet Take 1/2 - 1 tablet (5 - 10 mg total) by mouth 3 times daily as needed for muscle spasms. 60 tablet 3   DULoxetine (CYMBALTA) 20 MG capsule Take 2 capsules (40 mg total) by mouth daily. 180 capsule 3   esomeprazole (NEXIUM) 40 MG capsule Take 1 capsule (40 mg total) by mouth daily. 90 capsule 3   estradiol (ESTRACE VAGINAL) 0.1 MG/GM vaginal cream Place 1 Applicatorful vaginally nightly for 2 weeks, then twice weekly therafter 42.5 g 2   ezetimibe (ZETIA) 10 MG tablet Take 1 tablet by mouth daily. 90 tablet 3   furosemide (LASIX) 40 MG tablet Take 1 tablet (40 mg total) by mouth daily. 90 tablet 3   ondansetron (ZOFRAN) 8 MG tablet Take 1 tablet (8 mg total) by mouth every 8 (eight) hours as needed for nausea or vomiting. 30 tablet 3   Semaglutide, 2 MG/DOSE, 8 MG/3ML SOPN Inject 2 mg as directed once a week. 3 mL 3    traMADol (ULTRAM) 50 MG tablet Take 1 tablet (50 mg total) by mouth every 8 (eight) hours as needed for moderate pain or severe pain. 60 tablet 2   triamterene-hydrochlorothiazide (MAXZIDE-25) 37.5-25 MG tablet Take 1 tablet by mouth daily. 90 tablet 3   TURMERIC PO Take by mouth.     Vitamin D, Ergocalciferol, (DRISDOL) 1.25 MG (50000  UNIT) CAPS capsule Take 1 capsule (50,000 Units total) by mouth every 7 (seven) days. 12 capsule 3   No current facility-administered medications for this visit.   No results found.  Review of Systems:   A ROS was performed including pertinent positives and negatives as documented in the HPI.   Musculoskeletal Exam:     Presents with well-appearing wound.  No erythema or drainage.  Range of motion is 20 degrees dorsiflexion plantarflexion without pain.  No residual lateral laxity.  Imaging:      I personally reviewed and interpreted the radiographs.   Assessment:   6 weeks status post right ankle Brostrm repair overall doing extremely well.  We will continue to have her progress with physical therapy and I would like her to remain on light duty for the time being  Plan :    -Return to clinic in 6 weeks       I personally saw and evaluated the patient, and participated in the management and treatment plan.  Huel Cote, MD Attending Physician, Orthopedic Surgery  This document was dictated using Dragon voice recognition software. A reasonable attempt at proof reading has been made to minimize errors.

## 2023-07-15 ENCOUNTER — Encounter (HOSPITAL_BASED_OUTPATIENT_CLINIC_OR_DEPARTMENT_OTHER): Payer: Self-pay | Admitting: Physical Therapy

## 2023-07-20 ENCOUNTER — Other Ambulatory Visit: Payer: Self-pay

## 2023-07-20 ENCOUNTER — Other Ambulatory Visit (HOSPITAL_COMMUNITY): Payer: Self-pay

## 2023-07-20 MED ORDER — TIRZEPATIDE 15 MG/0.5ML ~~LOC~~ SOAJ
15.0000 mg | SUBCUTANEOUS | 3 refills | Status: DC
Start: 2023-07-20 — End: 2024-07-29
  Filled 2023-07-20: qty 6, 84d supply, fill #0
  Filled 2023-10-19: qty 6, 84d supply, fill #1
  Filled 2024-01-13: qty 6, 84d supply, fill #2
  Filled 2024-04-13: qty 6, 84d supply, fill #3

## 2023-07-20 NOTE — Telephone Encounter (Signed)
Please let Tyann know I have changed the medication back to [facility].

## 2023-07-21 ENCOUNTER — Ambulatory Visit (HOSPITAL_BASED_OUTPATIENT_CLINIC_OR_DEPARTMENT_OTHER): Payer: Commercial Managed Care - PPO | Admitting: Physical Therapy

## 2023-07-21 ENCOUNTER — Other Ambulatory Visit (HOSPITAL_COMMUNITY): Payer: Self-pay

## 2023-07-27 ENCOUNTER — Other Ambulatory Visit (HOSPITAL_COMMUNITY): Payer: Self-pay

## 2023-07-27 NOTE — Therapy (Signed)
OUTPATIENT PHYSICAL THERAPY LOWER EXTREMITY TREATMENT   Patient Name: Michelle Ray MRN: [id number] DOB:May 17, 1966, 57 y.o., female Today's Date: 07/28/2023  END OF SESSION:  PT End of Session - 07/28/23 0803     Visit Number 7    Number of Visits 26    Date for PT Re-Evaluation 08/24/23    Authorization Type Gretta Began    PT Start Time 0802    PT Stop Time 0840    PT Time Calculation (min) 38 min    Activity Tolerance Patient tolerated treatment well    Behavior During Therapy [facility] for tasks assessed/performed                Past Medical History:  Diagnosis Date   Allergy    Anemia    Aortic atherosclerosis (HCC)    Benign secondary hypertension due to renal artery stenosis (HCC)    Constipation    uses OTC stool softener prn only- this is not chronic   Diabetes mellitus without complication (HCC)    GERD (gastroesophageal reflux disease)    Hyperlipemia    Hyperlipidemia    Hypertension    Pain and swelling of right ankle 02/17/2021   Right upper quadrant abdominal pain 02/17/2021   Past Surgical History:  Procedure Laterality Date   ABLATION     ANKLE ARTHROSCOPY Right 05/25/2023   Procedure: RIGHT ANKLE ARTHROSCOPY WITH BROSTROM REPAIR;  Surgeon: Huel Cote, MD;  Location: [facility];  Service: Orthopedics;  Laterality: Right;   ENDOVENOUS ABLATION SAPHENOUS VEIN W/ LASER Right 01/28/2022   endovenous laser ablation right greater saphenous vein and stab phlebectomy 10-20 incisions right leg by Cari Caraway MD   TUBAL LIGATION     uterine fibroid removal     WISDOM TOOTH EXTRACTION     Patient Active Problem List   Diagnosis Date Noted   Brain fog 06/29/2023   Gastroesophageal reflux disease 06/29/2023   Bilateral leg edema 06/29/2023   Vaginal atrophy 06/29/2023   Vitamin D deficiency 06/02/2023   Right ankle instability 05/25/2023   RLS (restless legs syndrome) 03/06/2022   Fibromyalgia muscle pain 03/06/2022   Atrophy of  right kidney 08/29/2021   Varicose veins of both legs with edema 02/17/2021   Stress incontinence in female 02/17/2021   Controlled type 2 diabetes mellitus with complication, without long-term current use of insulin (HCC) 02/17/2021   Hyperlipidemia associated with type 2 diabetes mellitus (HCC) 02/17/2021   BMI 40.0-44.9, adult (HCC) 02/17/2021   Aortic atherosclerosis (HCC) 02/17/2021   Chronic thumb pain, bilateral 02/17/2021   Hypertension associated with diabetes (HCC) 05/29/2016     REFERRING PROVIDER: Huel Cote MD  REFERRING DIAG: M25.371 (ICD-10-CM) - Right ankle instability  S/p Right ankle Brostrm repair with internal brace and limited debridement  THERAPY DIAG:  Pain in right ankle and joints of right foot  Stiffness of right ankle, not elsewhere classified  Difficulty in walking, not elsewhere classified  Rationale for Evaluation and Treatment: Rehabilitation  ONSET DATE: DOS 05/25/2023   Days since surgery: 64  SUBJECTIVE:   SUBJECTIVE STATEMENT:  Pt denies pinching, pain or N/T.    PERTINENT HISTORY: Right ankle arthroscopy with Brostrm repair with internal brace and limited debridement on 05/25/2023.  WBAT on R ankle in a CAM boot L knee OA, DM type 2, and HTN   PAIN:  Are you having pain? No Location:  R ankle, worse in lateral ankle NPRS:  2/10 current, 3-4/10 worst, 1/10 best  PRECAUTIONS: Other:  per surgical protocol  WEIGHT BEARING RESTRICTIONS: Yes WBAT in  CAM boot  FALLS:  Has patient fallen in last 6 months? 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    PLOF: Independent.  Pt was able to perform her ADLs/IADLs and functional mobility skills independently.  Pt worked out at [employer]  2 days per week performing upper/lower body strengthening and cardio.    PATIENT GOALS:  have reduced pain at work, driving, walking normally, and return to workout  NEXT MD VISIT: 06/16/2023  OBJECTIVE:   DIAGNOSTIC FINDINGS: Pt is post op. Pt had x rays and a MRI prior to surgery.  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    TODAY'S TREATMENT:        Treatment                            8/28:  DF active 0, Passive +3 Manual: talocrural distraction & AP grade 4 mobs at end range DF IASTM gastroc Sidelying hip abd, circles, arcs Standing: tandem & SLS static & with head turns Retro & lateral stepping BAPS board L3 Seated heel raise with 5lb resting on knee    8/8  Recumbent bike warm up 5 min   Staggered stance STS 3x8 Tandem balance 30s 3x  Ankle alphabet 3x  Wobble board 2x20 AP and ML Retro walking 3x laps at bar  HR at step 3s 2x15-20      PATIENT EDUCATION:  Education details:  post op and protocol limitations and restrictions, POC, and HEP Person educated: Patient Education method: Explanation, Demonstration, Tactile cues, Verbal cues, and Handouts Education comprehension: verbalized understanding, returned demonstration, verbal cues required, and tactile cues required  HOME EXERCISE PROGRAM: Access Code: [email or web address] URL: [email or web address]     ASSESSMENT:  CLINICAL IMPRESSION: Cues to avoid LE turnout in side stepping. Has ROM for BAPS L3 but has significant difficulty keeping rim in contact with floor. Will benefit from continued stability as ROM progresses.    OBJECTIVE IMPAIRMENTS: Abnormal gait, decreased activity tolerance, decreased endurance, decreased mobility, difficulty walking, decreased ROM, decreased strength, hypomobility, increased edema, impaired flexibility, and pain.   ACTIVITY LIMITATIONS: standing, squatting, stairs, transfers, and locomotion level  PARTICIPATION LIMITATIONS: meal prep, cleaning, driving,  shopping, community activity, and occupation  PERSONAL FACTORS: 1-2 comorbidities: L knee OA, DM type 2  are also affecting patient's functional outcome.   REHAB POTENTIAL: Good  CLINICAL DECISION MAKING: Stable/uncomplicated  EVALUATION COMPLEXITY: Low   GOALS:  SHORT TERM GOALS:   Pt will be independent and compliant with HEP for improved ROM, strength, mobility, and function. Baseline: Goal status: achieved Target date:  07/13/2023  2.  Pt will wean out of boot as allowed by MD without adverse effects.  Baseline:  Goal status: achieved Target date:  07/20/2023   3.  Pt will tolerate ankle ROM as appropriate per protocol without adverse effects for improved stiffness, ROM, and mobility. Baseline:  Goal status: achieved Target date:  07/13/2023     LONG TERM GOALS:   Pt will demo R ankle AROM to be [facility] t/o for performance of ADLs and IADLs. Baseline:  Goal status: INITIAL Target date:  08/17/2023   2.  Pt will ambulate with a normalized heel to toe gait pattern without limping.  Baseline:  Goal status: INITIAL Target date:  08/17/2023  3.  Pt will ambulate extended community distance without increased pain and with good stability.  Baseline:  Goal status: INITIAL Target date: 09/14/2023   4.  Pt will demo 5/5 strength in DF and eversion, WFL in PF in sitting, and 4 to 4+/5 in inversion for improved performance of functional mobility skills and work activities.  Baseline:  Goal status: INITIAL Target date: 09/14/2023  5.  Pt will be able to perform stairs with a reciprocal gait with good control and without significant difficulty.  Baseline:  Goal status: INITIAL Target date: 09/14/2023  6.  Pt will demo 10 symmetrical bilat heel raises without significant pain for improved strength.  Baseline:  Goal status: INITIAL Target date:   08/24/2023  7.  Pt will be able to perform her occupational activities as allowed by MD without adverse effects  Goal status:   INITIAL  Target date:  09/14/2023    PLAN:  PT FREQUENCY:  1x/wk initially and progress to 2x/wk  PT DURATION: other: 15 weeks  PLANNED INTERVENTIONS: Therapeutic exercises, Therapeutic activity, Neuromuscular re-education, Balance training, Gait training, Patient/Family education, Self Care, Joint mobilization, Stair training, DME instructions, Aquatic Therapy, Dry Needling, Electrical stimulation, Cryotherapy, Moist heat, scar mobilization, Taping, Ultrasound, Manual therapy, and Re-evaluation  PLAN FOR NEXT SESSION: Cont per Ankle Brostrom repair protocol and MD orders.    Amalio Loe C. Axyl Sitzman PT, DPT 07/28/23 8:42 AM

## 2023-07-28 ENCOUNTER — Ambulatory Visit (HOSPITAL_BASED_OUTPATIENT_CLINIC_OR_DEPARTMENT_OTHER): Payer: Commercial Managed Care - PPO | Admitting: Physical Therapy

## 2023-07-28 ENCOUNTER — Encounter (HOSPITAL_BASED_OUTPATIENT_CLINIC_OR_DEPARTMENT_OTHER): Payer: Self-pay | Admitting: Physical Therapy

## 2023-07-28 DIAGNOSIS — M25671 Stiffness of right ankle, not elsewhere classified: Secondary | ICD-10-CM | POA: Diagnosis not present

## 2023-07-28 DIAGNOSIS — M25571 Pain in right ankle and joints of right foot: Secondary | ICD-10-CM

## 2023-07-28 DIAGNOSIS — R262 Difficulty in walking, not elsewhere classified: Secondary | ICD-10-CM | POA: Diagnosis not present

## 2023-07-28 DIAGNOSIS — M6281 Muscle weakness (generalized): Secondary | ICD-10-CM | POA: Diagnosis not present

## 2023-08-04 ENCOUNTER — Ambulatory Visit (HOSPITAL_BASED_OUTPATIENT_CLINIC_OR_DEPARTMENT_OTHER): Payer: Commercial Managed Care - PPO | Admitting: Physical Therapy

## 2023-08-05 ENCOUNTER — Ambulatory Visit (HOSPITAL_BASED_OUTPATIENT_CLINIC_OR_DEPARTMENT_OTHER): Payer: Commercial Managed Care - PPO | Attending: Orthopaedic Surgery | Admitting: Physical Therapy

## 2023-08-05 ENCOUNTER — Encounter (HOSPITAL_COMMUNITY): Payer: Self-pay

## 2023-08-05 ENCOUNTER — Encounter (HOSPITAL_BASED_OUTPATIENT_CLINIC_OR_DEPARTMENT_OTHER): Payer: Self-pay | Admitting: Physical Therapy

## 2023-08-05 DIAGNOSIS — M25571 Pain in right ankle and joints of right foot: Secondary | ICD-10-CM | POA: Diagnosis not present

## 2023-08-05 DIAGNOSIS — M25671 Stiffness of right ankle, not elsewhere classified: Secondary | ICD-10-CM | POA: Insufficient documentation

## 2023-08-05 DIAGNOSIS — M6281 Muscle weakness (generalized): Secondary | ICD-10-CM | POA: Insufficient documentation

## 2023-08-05 DIAGNOSIS — R262 Difficulty in walking, not elsewhere classified: Secondary | ICD-10-CM | POA: Diagnosis not present

## 2023-08-05 NOTE — Therapy (Addendum)
 OUTPATIENT PHYSICAL THERAPY LOWER EXTREMITY TREATMENT   Patient Name: Michelle Ray MRN: [id number] DOB:[birth year], 57 y.o., female Today's Date: 08/06/2023  END OF SESSION:  PT End of Session - 08/05/23 1539     Visit Number 8    Number of Visits 26    Date for PT Re-Evaluation 08/24/23    Authorization Type Arlin Benes [employer]    PT Start Time 1537    PT Stop Time 1620    PT Time Calculation (min) 43 min    Activity Tolerance Patient tolerated treatment well;No increased pain    Behavior During Therapy WFL for tasks assessed/performed                 Past Medical History:  Diagnosis Date   Allergy    Anemia    Aortic atherosclerosis (HCC)    Benign secondary hypertension due to renal artery stenosis (HCC)    Constipation    uses OTC stool softener prn only- this is not chronic   Diabetes mellitus without complication (HCC)    GERD (gastroesophageal reflux disease)    Hyperlipemia    Hyperlipidemia    Hypertension    Pain and swelling of right ankle 02/17/2021   Right upper quadrant abdominal pain 02/17/2021   Past Surgical History:  Procedure Laterality Date   ABLATION     ANKLE ARTHROSCOPY Right 05/25/2023   Procedure: RIGHT ANKLE ARTHROSCOPY WITH BROSTROM REPAIR;  Surgeon: Wilhelmenia Harada, MD;  Location: [facility];  Service: Orthopedics;  Laterality: Right;   ENDOVENOUS ABLATION SAPHENOUS VEIN W/ LASER Right 01/28/2022   endovenous laser ablation right greater saphenous vein and stab phlebectomy 10-20 incisions right leg by Kirtland Perfect MD   TUBAL LIGATION     uterine fibroid removal     WISDOM TOOTH EXTRACTION     Patient Active Problem List   Diagnosis Date Noted   Brain fog 06/29/2023   Gastroesophageal reflux disease 06/29/2023   Bilateral leg edema 06/29/2023   Vaginal atrophy 06/29/2023   Vitamin D  deficiency 06/02/2023   Right ankle instability 05/25/2023   RLS (restless legs syndrome) 03/06/2022   Fibromyalgia muscle pain  03/06/2022   Atrophy of right kidney 08/29/2021   Varicose veins of both legs with edema 02/17/2021   Stress incontinence in female 02/17/2021   Controlled type 2 diabetes mellitus with complication, without long-term current use of insulin (HCC) 02/17/2021   Hyperlipidemia associated with type 2 diabetes mellitus (HCC) 02/17/2021   BMI 40.0-44.9, adult (HCC) 02/17/2021   Aortic atherosclerosis (HCC) 02/17/2021   Chronic thumb pain, bilateral 02/17/2021   Hypertension associated with diabetes (HCC) 05/29/2016     REFERRING PROVIDER: Wilhelmenia Harada MD  REFERRING DIAG: M25.371 (ICD-10-CM) - Right ankle instability  S/p Right ankle Brostrm repair with internal brace and limited debridement  THERAPY DIAG:  Pain in right ankle and joints of right foot  Stiffness of right ankle, not elsewhere classified  Difficulty in walking, not elsewhere classified  Muscle weakness (generalized)  Rationale for Evaluation and Treatment: Rehabilitation  ONSET DATE: DOS 05/25/2023   Days since surgery: 72  SUBJECTIVE:   SUBJECTIVE STATEMENT:  Pt is 10 weeks and 2 days s/p R ankle Brostrom repair.  Pt denies any adverse effects after prior Rx.  Pt reports no specific functional improvements though states her ankle is better.     PERTINENT HISTORY: Right ankle arthroscopy with Brostrm repair with internal brace and limited debridement on 05/25/2023.  WBAT on R ankle in a CAM boot  L knee OA, DM type 2, and HTN   PAIN:  Are you having pain? No Location:  R ankle NPRS:  0/10 current, 1-2/10 worst, 0/10 best  PRECAUTIONS: Other: per surgical protocol  WEIGHT BEARING RESTRICTIONS: Yes    FALLS:  Has patient fallen in last 6 months? 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    PLOF: Independent.  Pt was able to perform her ADLs/IADLs and functional mobility skills independently.  Pt worked out at [employer] 2 days per week performing upper/lower body strengthening and cardio.    PATIENT GOALS:  have reduced pain at work, driving, walking normally, and return to workout  NEXT MD VISIT: 06/16/2023  OBJECTIVE:   DIAGNOSTIC FINDINGS: Pt is post op. Pt had x rays and a MRI prior to surgery.  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    TODAY'S TREATMENT:          Pt performed: Recumbent bike warm up 5 min  Ankle alphabet x 1 rep Submaximal isometrics in DF and Eve x 10 reps each BAPS cw and ccw 2x10 each SLS x12 sec and 20 sec with occasional UE support, with 5 head turns Tandem stance 2x30 sec bilat Step ups 6 inch step 2x10 Seated heel raise with 5lb resting on knee 2x10   Pt received R ankle PROM in DF     PATIENT EDUCATION:  Education details:  post op and protocol limitations and restrictions, exercise form, POC, and HEP Person educated: Patient Education method: Explanation, Demonstration, Tactile cues, Verbal cues, and Handouts Education comprehension: verbalized understanding, returned demonstration, verbal cues required, and tactile cues required  HOME EXERCISE PROGRAM: Access Code: [email or web address] URL: [email or web address]     ASSESSMENT:  CLINICAL IMPRESSION: Pt is progressing well in all areas and is progressing appropriately with protocol.  She performed exercises per protocol well without c/o's.  Pt demonstrates improved DF AROM based on visual observation.  She responded well to Rx reporting no pain after Rx.  Pt should cont to benefit from cont skilled PT services to address ongoing goals and impairments and to assist in restoring desired level of function.  OBJECTIVE IMPAIRMENTS: Abnormal gait, decreased activity tolerance, decreased endurance, decreased mobility, difficulty  walking, decreased ROM, decreased strength, hypomobility, increased edema, impaired flexibility, and pain.   ACTIVITY LIMITATIONS: standing, squatting, stairs, transfers, and locomotion level  PARTICIPATION LIMITATIONS: meal prep, cleaning, driving, shopping, community activity, and occupation  PERSONAL FACTORS: 1-2 comorbidities: L knee OA, DM type 2 are also affecting patient's functional outcome.   REHAB POTENTIAL: Good  CLINICAL DECISION MAKING: Stable/uncomplicated  EVALUATION COMPLEXITY: Low   GOALS:  SHORT TERM GOALS:   Pt will be independent and compliant with HEP for improved ROM, strength, mobility, and function. Baseline: Goal status: achieved Target date:  07/13/2023  2.  Pt will wean out of boot as allowed by MD without adverse effects.  Baseline:  Goal status: achieved Target date:  07/20/2023   3.  Pt will tolerate ankle ROM as appropriate per protocol without adverse effects for improved stiffness, ROM, and mobility. Baseline:  Goal status: achieved Target date:  07/13/2023     LONG TERM GOALS:   Pt will demo R ankle AROM  to be [facility] t/o for performance of ADLs and IADLs. Baseline:  Goal status: INITIAL Target date:  08/17/2023   2.  Pt will ambulate with a normalized heel to toe gait pattern without limping.  Baseline:  Goal status: INITIAL Target date:  08/17/2023  3.  Pt will ambulate extended community distance without increased pain and with good stability.  Baseline:  Goal status: INITIAL Target date: 09/14/2023   4.  Pt will demo 5/5 strength in DF and eversion, WFL in PF in sitting, and 4 to 4+/5 in inversion for improved performance of functional mobility skills and work activities.  Baseline:  Goal status: INITIAL Target date: 09/14/2023  5.  Pt will be able to perform stairs with a reciprocal gait with good control and without significant difficulty.  Baseline:  Goal status: INITIAL Target date: 09/14/2023  6.  Pt will demo 10  symmetrical bilat heel raises without significant pain for improved strength.  Baseline:  Goal status: INITIAL Target date:   08/24/2023  7.  Pt will be able to perform her occupational activities as allowed by MD without adverse effects  Goal status:  INITIAL  Target date:  09/14/2023    PLAN:  PT FREQUENCY: 1x/wk initially and progress to 2x/wk  PT DURATION: other: 15 weeks  PLANNED INTERVENTIONS: Therapeutic exercises, Therapeutic activity, Neuromuscular re-education, Balance training, Gait training, Patient/Family education, Self Care, Joint mobilization, Stair training, DME instructions, Aquatic Therapy, Dry Needling, Electrical stimulation, Cryotherapy, Moist heat, scar mobilization, Taping, Ultrasound, Manual therapy, and Re-evaluation  PLAN FOR NEXT SESSION: Cont per Ankle Brostrom repair protocol and MD orders.    Trina Fujita III PT, DPT 08/06/23 1:15 PM   PHYSICAL THERAPY DISCHARGE SUMMARY  Visits from Start of Care: 8  Current functional level related to goals / functional outcomes: Unable to assess current functional status or goals due to pt not present at discharge.    Remaining deficits: See above   Education / Equipment: See above   Patient was seen in PT from 06/01/23 - 08/05/23.   Pt was progressing well in all areas and was progressing appropriately with protocol.  Pt called to cancel her following appt after 9/5 and did not call back to reschedule.  Pt will be considered discharged from skilled PT due to not scheduling any further PT.   Trina Fujita III PT, DPT 04/08/24 10:20 AM

## 2023-08-06 ENCOUNTER — Other Ambulatory Visit (HOSPITAL_COMMUNITY): Payer: Self-pay

## 2023-08-16 ENCOUNTER — Ambulatory Visit (HOSPITAL_BASED_OUTPATIENT_CLINIC_OR_DEPARTMENT_OTHER): Payer: Commercial Managed Care - PPO | Admitting: Orthopaedic Surgery

## 2023-08-18 ENCOUNTER — Ambulatory Visit (HOSPITAL_BASED_OUTPATIENT_CLINIC_OR_DEPARTMENT_OTHER): Payer: Commercial Managed Care - PPO | Admitting: Physical Therapy

## 2023-08-25 ENCOUNTER — Ambulatory Visit (HOSPITAL_BASED_OUTPATIENT_CLINIC_OR_DEPARTMENT_OTHER): Payer: Commercial Managed Care - PPO | Admitting: Orthopaedic Surgery

## 2023-08-25 ENCOUNTER — Other Ambulatory Visit (HOSPITAL_BASED_OUTPATIENT_CLINIC_OR_DEPARTMENT_OTHER): Payer: Self-pay

## 2023-08-25 DIAGNOSIS — M25562 Pain in left knee: Secondary | ICD-10-CM

## 2023-08-25 DIAGNOSIS — M76829 Posterior tibial tendinitis, unspecified leg: Secondary | ICD-10-CM | POA: Diagnosis not present

## 2023-08-25 MED ORDER — MELOXICAM 15 MG PO TABS
15.0000 mg | ORAL_TABLET | Freq: Every day | ORAL | 0 refills | Status: DC
  Filled 2023-08-25: qty 7, 7d supply, fill #0

## 2023-08-25 MED ORDER — TRIAMCINOLONE ACETONIDE 40 MG/ML IJ SUSP
80.0000 mg | INTRAMUSCULAR | Status: AC | PRN
Start: 2023-08-25 — End: 2023-08-25
  Administered 2023-08-25: 80 mg via INTRA_ARTICULAR

## 2023-08-25 MED ORDER — LIDOCAINE HCL 1 % IJ SOLN
4.0000 mL | INTRAMUSCULAR | Status: AC | PRN
Start: 2023-08-25 — End: 2023-08-25
  Administered 2023-08-25: 4 mL

## 2023-08-25 NOTE — Progress Notes (Signed)
Post Operative Evaluation    Procedure/Date of Surgery: Right ankle Brostrm repair and ankle arthroscopy 6/25  Interval History:   Presents today for follow-up of the above procedure.  Overall she is doing extremely well with regard to the right ankle.  That being said she does feel like she has flared up the left ankle recently while playing with her grandchild this previous Friday.  She is also experiencing persistent left knee pain as well which has been somewhat her baseline.  PMH/PSH/Family History/Social History/Meds/Allergies:    Past Medical History:  Diagnosis Date  . Allergy   . Anemia   . Aortic atherosclerosis (HCC)   . Benign secondary hypertension due to renal artery stenosis (HCC)   . Constipation    uses OTC stool softener prn only- this is not chronic  . Diabetes mellitus without complication (HCC)   . GERD (gastroesophageal reflux disease)   . Hyperlipemia   . Hyperlipidemia   . Hypertension   . Pain and swelling of right ankle 02/17/2021  . Right upper quadrant abdominal pain 02/17/2021   Past Surgical History:  Procedure Laterality Date  . ABLATION    . ANKLE ARTHROSCOPY Right 05/25/2023   Procedure: RIGHT ANKLE ARTHROSCOPY WITH BROSTROM REPAIR;  Surgeon: Huel Cote, MD;  Location: [facility];  Service: Orthopedics;  Laterality: Right;  . ENDOVENOUS ABLATION SAPHENOUS VEIN W/ LASER Right 01/28/2022   endovenous laser ablation right greater saphenous vein and stab phlebectomy 10-20 incisions right leg by Cari Caraway MD  . TUBAL LIGATION    . uterine fibroid removal    . WISDOM TOOTH EXTRACTION     Social History   Socioeconomic History  . Marital status: Married    Spouse name: Minerva Areola  . Number of children: Not on file  . Years of education: Not on file  . Highest education level: Master's degree (e.g., MA, MS, MEng, MEd, MSW, MBA)  Occupational History  . 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 Smoking status: Former    Passive exposure: Past  . Smokeless tobacco: Never  Vaping Use  . Vaping status: Never Used  Substance and Sexual Activity  . Alcohol use: No  . Drug use: No  . Sexual activity: Yes    Partners: Male    Birth control/protection: Post-menopausal    Comment: 1st intercourse- 10, partners- 5  Other Topics Concern  . 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 Adult children.    Social Determinants of Health   Financial Resource Strain: Low Risk  (05/24/2023)   Overall Financial Resource Strain (CARDIA)   . Difficulty of Paying Living Expenses: Not hard at all  Food Insecurity: No Food Insecurity (05/24/2023)   Hunger Vital Sign   . Worried About Programme researcher, broadcasting/film/video in the Last Year: Never true   . Ran Out of Food in the Last Year: Never true  Transportation Needs: No Transportation Needs (05/24/2023)   PRAPARE - Transportation   . Lack of Transportation (Medical): No   . Lack of Transportation (Non-Medical): No  Physical Activity: Insufficiently Active (05/24/2023)   Exercise Vital Sign   . Days of Exercise per Week: 1 day   .  Minutes of Exercise per Session: 40 min  Stress: No Stress Concern Present (05/24/2023)   [employer] of Occupational Health - Occupational Stress Questionnaire   . Feeling of Stress : Not at all  Social Connections: Socially Integrated (05/24/2023)   Social Connection and Isolation Panel [NHANES]   . Frequency of Communication with Friends and Family: More than three times a week   . Frequency of Social Gatherings with Friends and Family: Twice a week   . Attends Religious Services: More than 4 times per year   . Active Member of Clubs or Organizations: Yes   . Attends Banker Meetings: 1 to 4 times per year   . Marital Status: Married   Family History  Problem Relation Age of Onset  . Atrial fibrillation Mother   .  Pulmonary embolism Mother   . Breast cancer Mother 49  . Heart failure Sister   . Breast cancer Maternal Grandmother   . Hypertension Maternal Grandmother   . Heart disease Maternal Grandfather   . Stroke Paternal Grandfather   . Colon cancer Neg Hx   . Colon polyps Neg Hx    Allergies  Allergen Reactions  . Crestor [Rosuvastatin]     Joint pain  . Macrobid [Nitrofurantoin]   . Pravastatin     Joint pain   Current Outpatient Medications  Medication Sig Dispense Refill  . albuterol (VENTOLIN HFA) 108 (90 Base) MCG/ACT inhaler Inhale 2 puffs into the lungs every 6 (six) hours as needed for wheezing or shortness of breath (Cough). 6.7 g 0  . Bempedoic Acid (NEXLETOL) 180 MG TABS Take 1 tablet (180 mg total) by mouth daily at 6 (six) AM. 90 tablet 3  . benazepril (LOTENSIN) 20 MG tablet Take 1 tablet (20 mg total) by mouth daily. 90 tablet 3  . clobetasol ointment (TEMOVATE) 0.05 % Apply as directed-- twice daily x 2 weeks-- then once daily x 2 weeks-- then twice weekly 30 g 2  . Continuous Glucose Sensor (FREESTYLE LIBRE 3 SENSOR) MISC Place 1 sensor on the skin every 14 days. Use to check glucose continuously 2 each 11  . cyclobenzaprine (FLEXERIL) 10 MG tablet Take 1/2 - 1 tablet (5 - 10 mg total) by mouth 3 times daily as needed for muscle spasms. 60 tablet 3  . DULoxetine (CYMBALTA) 20 MG capsule Take 2 capsules (40 mg total) by mouth daily. 180 capsule 3  . esomeprazole (NEXIUM) 40 MG capsule Take 1 capsule (40 mg total) by mouth daily. 90 capsule 3  . estradiol (ESTRACE VAGINAL) 0.1 MG/GM vaginal cream Place 1 Applicatorful vaginally nightly for 2 weeks, then twice weekly therafter 42.5 g 2  . ezetimibe (ZETIA) 10 MG tablet Take 1 tablet by mouth daily. 90 tablet 3  . furosemide (LASIX) 40 MG tablet Take 1 tablet (40 mg total) by mouth daily. 90 tablet 3  . ondansetron (ZOFRAN) 8 MG tablet Take 1 tablet (8 mg total) by mouth every 8 (eight) hours as needed for nausea or vomiting. 30  tablet 3  . tirzepatide (MOUNJARO) 15 MG/0.5ML Pen Inject 15 mg into the skin once a week. 6 mL 3  . traMADol (ULTRAM) 50 MG tablet Take 1 tablet (50 mg total) by mouth every 8 (eight) hours as needed for moderate pain or severe pain. 60 tablet 2  . triamterene-hydrochlorothiazide (MAXZIDE-25) 37.5-25 MG tablet Take 1 tablet by mouth daily. 90 tablet 3  . TURMERIC PO Take by mouth.    . Vitamin D, Ergocalciferol, (  DRISDOL) 1.25 MG (50000 UNIT) CAPS capsule Take 1 capsule (50,000 Units total) by mouth every 7 (seven) days. 12 capsule 3   No current facility-administered medications for this visit.   No results found.  Review of Systems:   A ROS was performed including pertinent positives and negatives as documented in the HPI.   Musculoskeletal Exam:     Presents with well-appearing wound.  No erythema or drainage.  Range of motion is 20 degrees dorsiflexion plantarflexion without pain.  No residual lateral laxity.  Left ankle with tenderness palpation about the PT tendon.  There is some medial joint line tenderness with mild Baker's cyst on the left.  Range of motion is from 0-130 on the left  Imaging:      I personally reviewed and interpreted the radiographs.   Assessment:   12 weeks status post right ankle Brostrm repair overall doing extremely well.  With regard to the left knee she does have some mild medial based osteoarthritis as well as some left ankle PT tendon tendinitis.  To this effect I did recommend a left knee ultrasound-guided injection which she would like to proceed with today.  She will shoot [place] a message in a couple months if she is still having persistent ankle pain but in the meantime I would like her to get back going with physical therapy to work on the left ankle/PT tendon as well as the left knee Plan :    -Return to clinic as needed    Procedure Note  Patient: Michelle Ray             Date of Birth: [birth year]           MRN: [id number]              Visit Date: 08/25/2023  Procedures: Visit Diagnoses: No diagnosis found.  Large Joint Inj: L knee on 08/25/2023 8:42 AM Indications: pain Details: 22 G 1.5 in needle, ultrasound-guided anterior approach  Arthrogram: No  Medications: 4 mL lidocaine 1 %; 80 mg triamcinolone acetonide 40 MG/ML Outcome: tolerated well, no immediate complications Procedure, treatment alternatives, risks and benefits explained, specific risks discussed. Consent was given by the patient. Immediately prior to procedure a time out was called to verify the correct patient, procedure, equipment, support staff and site/side marked as required. Patient was prepped and draped in the usual sterile fashion.           I personally saw and evaluated the patient, and participated in the management and treatment plan.  Huel Cote, MD Attending Physician, Orthopedic Surgery  This document was dictated using Dragon voice recognition software. A reasonable attempt at proof reading has been made to minimize errors.

## 2023-08-31 ENCOUNTER — Other Ambulatory Visit: Payer: Self-pay

## 2023-09-03 ENCOUNTER — Encounter: Payer: Self-pay | Admitting: Cardiology

## 2023-09-03 ENCOUNTER — Ambulatory Visit: Payer: Commercial Managed Care - PPO | Attending: Cardiology | Admitting: Cardiology

## 2023-09-03 ENCOUNTER — Other Ambulatory Visit (HOSPITAL_COMMUNITY): Payer: Self-pay

## 2023-09-03 VITALS — BP 132/88 | HR 70 | Ht 63.0 in | Wt 187.8 lb

## 2023-09-03 DIAGNOSIS — I10 Essential (primary) hypertension: Secondary | ICD-10-CM | POA: Diagnosis not present

## 2023-09-03 DIAGNOSIS — E782 Mixed hyperlipidemia: Secondary | ICD-10-CM

## 2023-09-03 DIAGNOSIS — I2510 Atherosclerotic heart disease of native coronary artery without angina pectoris: Secondary | ICD-10-CM

## 2023-09-03 MED ORDER — FENOFIBRATE 145 MG PO TABS
145.0000 mg | ORAL_TABLET | Freq: Every day | ORAL | 3 refills | Status: DC
  Filled 2023-09-03: qty 90, 90d supply, fill #0
  Filled 2023-11-28 – 2023-12-15 (×2): qty 90, 90d supply, fill #1
  Filled 2024-03-15: qty 90, 90d supply, fill #2
  Filled 2024-07-03: qty 90, 90d supply, fill #3

## 2023-09-03 NOTE — Patient Instructions (Signed)
Medication Instructions:  Your physician has recommended you make the following change in your medication:   START - fenofibrate (TRICOR) 145 MG tablet - Take 1 tablet (145 mg total) by mouth daily  *If you need a refill on your cardiac medications before your next appointment, please call your pharmacy*  Lab Work: Your provider would like for you to return in 4 months to have the following labs drawn: Lipid panel.   Please go to [facility] [address] ([facility]) [address], [place] [address] You do not need an appointment.  They are open from 7:30 am-4 pm.  Lunch from 1:00 pm- 2:00 pm You do need to be fasting.  You [place] also go to any of these [facility] locations:  [employer]  - [address] Chief Technology Officer)   If you have labs (blood work) drawn today and your tests are completely normal, you will receive your results only by: [employer] (if you have MyChart) OR A paper copy in the mail If you have any lab test that is abnormal or we need to change your treatment, we will call you to review the results.  Testing/Procedures: -None ordered  Follow-Up: At [facility], you and your health needs are our priority.  As part of our continuing mission to provide you with exceptional heart care, we have created designated Provider Care Teams.  These Care Teams include your primary Cardiologist (physician) and Advanced Practice Providers (APPs -  Physician Assistants and Nurse Practitioners) who all work together to provide you with the care you need, when you need it.  Your next appointment:   1 year(s)  Provider:   You Gikas see Debbe Odea, MD or one of the following Advanced Practice Providers on your designated Care Team:   Nicolasa Ducking, NP Eula Listen, PA-C Cadence Fransico Michael, PA-C Charlsie Quest, NP    Other Instructions -None

## 2023-09-03 NOTE — Progress Notes (Signed)
Cardiology Office Note:    Date:  09/03/2023   ID:  Michelle Ray, DOB 06/23/1966, MRN [id number]  PCP:  Tollie Eth, NP  [facility] Cardiologist:  Debbe Odea, MD  [facility] Electrophysiologist:  None   Referring MD: Tollie Eth, NP   Chief Complaint  Patient presents with   Follow-up    Patient denies new or acute cardiac problems/concerns today.      History of Present Illness:    Michelle Ray is a 57 y.o. female with a hx of hypertension, hyperlipidemia, minimal LAD calcification (<25%), former smoker x30 years who presents for follow-up.    Patient is intolerant to statins, was prescribed Zetia and bempedoic acid for cholesterol control.  Has not been on bempedoic acid over the past 2 months due to lack of insurance approval.  Otherwise feels well, denies chest pain or shortness of breath, tolerating meds as prescribed.     Prior notes Coronary CT 1/22 calcium score 25.7, minimal mid LAD stenosis (<25%) Echocardiogram 1/22 EF 60 to 65%  has a history of early MI in her father at age 21.    Past Medical History:  Diagnosis Date   Allergy    Anemia    Aortic atherosclerosis (HCC)    Benign secondary hypertension due to renal artery stenosis (HCC)    Constipation    uses OTC stool softener prn only- this is not chronic   Diabetes mellitus without complication (HCC)    GERD (gastroesophageal reflux disease)    Hyperlipemia    Hyperlipidemia    Hypertension    Pain and swelling of right ankle 02/17/2021   Right upper quadrant abdominal pain 02/17/2021    Past Surgical History:  Procedure Laterality Date   ABLATION     ANKLE ARTHROSCOPY Right 05/25/2023   Procedure: RIGHT ANKLE ARTHROSCOPY WITH BROSTROM REPAIR;  Surgeon: Huel Cote, MD;  Location: [facility];  Service: Orthopedics;  Laterality: Right;   ENDOVENOUS ABLATION SAPHENOUS VEIN W/ LASER Right 01/28/2022   endovenous laser ablation right greater saphenous vein and stab  phlebectomy 10-20 incisions right leg by Cari Caraway MD   TUBAL LIGATION     uterine fibroid removal     WISDOM TOOTH EXTRACTION      Current Medications: Current Meds  Medication Sig   fenofibrate (TRICOR) 145 MG tablet Take 1 tablet (145 mg total) by mouth daily.   Red Yeast Rice Extract (RED YEAST RICE PO) Take 2 capsules by mouth daily.     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 Adult children.    Social Determinants of Health   Financial Resource Strain: Low Risk  (05/24/2023)   Overall Financial Resource Strain (CARDIA)    Difficulty of Paying Living Expenses: Not hard at all  Food Insecurity: No Food Insecurity (05/24/2023)   Hunger Vital Sign    Worried About Running Out of Food in the Last Year: Never true    Ran Out of Food in the Last Year: Never true  Transportation Needs: No Transportation Needs (05/24/2023)   PRAPARE - Administrator, Civil Service (Medical): No    Lack of Transportation (Non-Medical): No  Physical Activity: Insufficiently Active (05/24/2023)   Exercise Vital Sign    Days of Exercise per Week: 1 day    Minutes of Exercise per  Session: 40 min  Stress: No Stress Concern Present (05/24/2023)   [employer] of Occupational Health - Occupational Stress Questionnaire    Feeling of Stress : Not at all  Social Connections: Socially Integrated (05/24/2023)   Social Connection and Isolation Panel [NHANES]    Frequency of Communication with Friends and Family: More than three times a week    Frequency of Social Gatherings with Friends and Family: Twice a week    Attends Religious Services: More than 4 times per year    Active Member of [employer] or Organizations: Yes    Attends Banker Meetings: 1 to 4 times per year    Marital Status: Married     Family History: The patient's family history includes Atrial fibrillation in her mother; Breast cancer in her maternal grandmother; Breast cancer (age of onset: 63) in her mother; Heart disease in her maternal grandfather; Heart failure in her sister; Hypertension in her maternal grandmother; Pulmonary embolism in her mother; Stroke in her paternal grandfather. There is no history of Colon cancer or Colon polyps.  ROS:   Please see the history of present illness.     All other systems reviewed and are negative.  EKGs/Labs/Other Studies Reviewed:    The following studies were reviewed today:   EKG Interpretation Date/Time:  Friday September 03 2023 11:15:10 EDT Ventricular Rate:  70 PR Interval:  158 QRS Duration:  96 QT Interval:  392 QTC Calculation: 423 R Axis:   43  Text Interpretation: Normal sinus rhythm Normal ECG Confirmed by Debbe Odea ([id number]) on 09/03/2023 11:43:59 AM    Recent Labs: 05/24/2023: ALT 19; BUN 30; Creatinine, Ser 1.24; Hemoglobin 13.4; Platelets 402; Potassium 4.3; Sodium 139  Recent Lipid Panel    Component Value Date/Time   CHOL 191 05/24/2023 0955   TRIG 307 (H) 05/24/2023 0955   HDL 46 05/24/2023 0955   CHOLHDL 4.2 05/24/2023 0955   CHOLHDL 4.0 02/17/2021 1120   VLDL 36 02/17/2021 1120   LDLCALC 94 05/24/2023 0955      Risk Assessment/Calculations:     Physical Exam:    VS:  BP 132/88 (BP Location: Left Arm, Patient Position: Sitting, Cuff Size: Large)   Pulse 70   Ht 5\' 3"  (1.6 m)   Wt 187 lb 12.8 oz (85.2 kg)   LMP 03/20/2016   SpO2 98%   BMI 33.27 kg/m     Wt Readings from Last 3 Encounters:  09/03/23 187 lb 12.8 oz (85.2 kg)  05/25/23 204 lb 12.9 oz (92.9 kg)  05/24/23 204 lb 3.2 oz (92.6 kg)     GEN:  Well nourished, well developed in no acute distress HEENT: Normal NECK: No JVD; No carotid bruits  CARDIAC: RRR, no murmurs, rubs, gallops RESPIRATORY:  Clear to auscultation without rales, wheezing or rhonchi  ABDOMEN: Soft, non-tender, non-distended MUSCULOSKELETAL:  No edema; No deformity  SKIN: Warm and dry NEUROLOGIC:  Alert and oriented x 3 PSYCHIATRIC:  Normal affect   ASSESSMENT:    1. Coronary artery calcification   2. Primary hypertension   3. Mixed hyperlipidemia    PLAN:    In order of problems listed above:  LAD calcification, calcium score 25.7.  Minimal disease on coronary CT.  EF 60 to 65%.  Continue Zetia, start fenofibrate.  Bempedoic acid not approved by insurance.  Patient is statin intolerant Hypertension, BP controlled.  Continue benazepril 20 mg daily. Hyperlipidemia, total cholesterol and LDL control, triglycerides slightly elevated.  Start fenofibrate, Zetia.  Not tolerant to statins.  Check lipid panel in 4 months.  Follow-up in 1 year.    Medication Adjustments/Labs and Tests Ordered: Current medicines are reviewed at length with the patient today.  Concerns regarding medicines are outlined above.  Orders Placed This Encounter  Procedures   Lipid panel   EKG 12-Lead   Meds ordered this encounter  Medications   fenofibrate (TRICOR) 145 MG tablet    Sig: Take 1 tablet (145 mg total) by mouth daily.    Dispense:  90 tablet    Refill:  3    Patient Instructions  Medication Instructions:  Your physician has recommended you make the  following change in your medication:   START - fenofibrate (TRICOR) 145 MG tablet - Take 1 tablet (145 mg total) by mouth daily  *If you need a refill on your cardiac medications before your next appointment, please call your pharmacy*  Lab Work: Your provider would like for you to return in 4 months to have the following labs drawn: Lipid panel.   Please go to [facility] [address] ([facility] [address], [place] [address] You do not need an appointment.  They are open from 7:30 am-4 pm.  Lunch from 1:00 pm- 2:00 pm You do need to be fasting.  You [place] also go to any of these [facility] locations:  [employer]  - [address] Chief Technology Officer)   If you have labs (blood work) drawn today and your tests are completely normal, you will receive your results only by: [employer] (if you have MyChart) OR A paper copy in the mail If you have any lab test that is abnormal or we need to change your treatment, we will call you to review the results.  Testing/Procedures: -None ordered  Follow-Up: At [facility], you and your health needs are our priority.  As part of our continuing mission to provide you with exceptional heart care, we have created designated Provider Care Teams.  These Care Teams include your primary Cardiologist (physician) and Advanced Practice Providers (APPs -  Physician Assistants and Nurse Practitioners) who all work together to provide you with the care you need, when you need it.  Your next appointment:   1 year(s)  Provider:   You Lovin see Debbe Odea, MD or one of the following Advanced Practice Providers on your designated Care Team:   Nicolasa Ducking, NP Eula Listen, PA-C Cadence Fransico Michael, PA-C Charlsie Quest, NP    Other Instructions -None    Signed, Debbe Odea, MD  09/03/2023 2:05 PM    [facility]

## 2023-09-04 ENCOUNTER — Other Ambulatory Visit (HOSPITAL_COMMUNITY): Payer: Self-pay

## 2023-09-05 ENCOUNTER — Encounter (HOSPITAL_BASED_OUTPATIENT_CLINIC_OR_DEPARTMENT_OTHER): Payer: Self-pay | Admitting: Orthopaedic Surgery

## 2023-09-07 ENCOUNTER — Other Ambulatory Visit (HOSPITAL_BASED_OUTPATIENT_CLINIC_OR_DEPARTMENT_OTHER): Payer: Self-pay | Admitting: Orthopaedic Surgery

## 2023-09-07 DIAGNOSIS — M25372 Other instability, left ankle: Secondary | ICD-10-CM

## 2023-09-07 DIAGNOSIS — G8929 Other chronic pain: Secondary | ICD-10-CM

## 2023-09-17 ENCOUNTER — Other Ambulatory Visit (HOSPITAL_COMMUNITY): Payer: Self-pay

## 2023-09-23 ENCOUNTER — Other Ambulatory Visit (HOSPITAL_COMMUNITY): Payer: Self-pay

## 2023-09-26 ENCOUNTER — Ambulatory Visit
Admission: RE | Admit: 2023-09-26 | Discharge: 2023-09-26 | Disposition: A | Discharge status: Home / Self Care | Payer: Commercial Managed Care - PPO | Source: Ambulatory Visit | Attending: Orthopaedic Surgery | Admitting: Orthopaedic Surgery

## 2023-09-26 DIAGNOSIS — M19072 Primary osteoarthritis, left ankle and foot: Secondary | ICD-10-CM | POA: Diagnosis not present

## 2023-09-26 DIAGNOSIS — M722 Plantar fascial fibromatosis: Secondary | ICD-10-CM | POA: Diagnosis not present

## 2023-09-26 DIAGNOSIS — M25572 Pain in left ankle and joints of left foot: Secondary | ICD-10-CM | POA: Diagnosis not present

## 2023-09-26 DIAGNOSIS — M25372 Other instability, left ankle: Secondary | ICD-10-CM

## 2023-09-26 DIAGNOSIS — M7671 Peroneal tendinitis, right leg: Secondary | ICD-10-CM | POA: Diagnosis not present

## 2023-09-26 DIAGNOSIS — G8929 Other chronic pain: Secondary | ICD-10-CM

## 2023-09-29 ENCOUNTER — Other Ambulatory Visit (HOSPITAL_COMMUNITY): Payer: Self-pay

## 2023-09-30 ENCOUNTER — Other Ambulatory Visit (HOSPITAL_COMMUNITY): Payer: Self-pay

## 2023-10-05 ENCOUNTER — Other Ambulatory Visit (HOSPITAL_COMMUNITY): Payer: Self-pay

## 2023-10-05 ENCOUNTER — Other Ambulatory Visit: Payer: Self-pay

## 2023-10-14 ENCOUNTER — Encounter (HOSPITAL_BASED_OUTPATIENT_CLINIC_OR_DEPARTMENT_OTHER): Payer: Self-pay | Admitting: Orthopaedic Surgery

## 2023-10-19 ENCOUNTER — Other Ambulatory Visit (HOSPITAL_COMMUNITY): Payer: Self-pay

## 2023-10-29 DIAGNOSIS — M25571 Pain in right ankle and joints of right foot: Secondary | ICD-10-CM | POA: Diagnosis not present

## 2023-11-09 ENCOUNTER — Ambulatory Visit: Payer: Commercial Managed Care - PPO | Admitting: Nurse Practitioner

## 2023-11-09 ENCOUNTER — Other Ambulatory Visit (HOSPITAL_COMMUNITY): Payer: Self-pay

## 2023-11-09 ENCOUNTER — Encounter: Payer: Self-pay | Admitting: Nurse Practitioner

## 2023-11-09 VITALS — BP 124/82 | HR 77 | Ht 62.0 in | Wt 168.2 lb

## 2023-11-09 DIAGNOSIS — I152 Hypertension secondary to endocrine disorders: Secondary | ICD-10-CM

## 2023-11-09 DIAGNOSIS — Z0000 Encounter for general adult medical examination without abnormal findings: Secondary | ICD-10-CM

## 2023-11-09 DIAGNOSIS — H9193 Unspecified hearing loss, bilateral: Secondary | ICD-10-CM

## 2023-11-09 DIAGNOSIS — E1159 Type 2 diabetes mellitus with other circulatory complications: Secondary | ICD-10-CM | POA: Diagnosis not present

## 2023-11-09 DIAGNOSIS — Z6841 Body Mass Index (BMI) 40.0 and over, adult: Secondary | ICD-10-CM

## 2023-11-09 DIAGNOSIS — E1169 Type 2 diabetes mellitus with other specified complication: Secondary | ICD-10-CM | POA: Diagnosis not present

## 2023-11-09 DIAGNOSIS — Z87891 Personal history of nicotine dependence: Secondary | ICD-10-CM

## 2023-11-09 DIAGNOSIS — E118 Type 2 diabetes mellitus with unspecified complications: Secondary | ICD-10-CM

## 2023-11-09 DIAGNOSIS — M25571 Pain in right ankle and joints of right foot: Secondary | ICD-10-CM | POA: Diagnosis not present

## 2023-11-09 DIAGNOSIS — B372 Candidiasis of skin and nail: Secondary | ICD-10-CM

## 2023-11-09 DIAGNOSIS — E559 Vitamin D deficiency, unspecified: Secondary | ICD-10-CM

## 2023-11-09 DIAGNOSIS — M25371 Other instability, right ankle: Secondary | ICD-10-CM

## 2023-11-09 DIAGNOSIS — E785 Hyperlipidemia, unspecified: Secondary | ICD-10-CM

## 2023-11-09 DIAGNOSIS — I700 Atherosclerosis of aorta: Secondary | ICD-10-CM

## 2023-11-09 DIAGNOSIS — M79671 Pain in right foot: Secondary | ICD-10-CM | POA: Diagnosis not present

## 2023-11-09 DIAGNOSIS — M23206 Derangement of unspecified meniscus due to old tear or injury, right knee: Secondary | ICD-10-CM

## 2023-11-09 DIAGNOSIS — Z23 Encounter for immunization: Secondary | ICD-10-CM | POA: Diagnosis not present

## 2023-11-09 DIAGNOSIS — Z1211 Encounter for screening for malignant neoplasm of colon: Secondary | ICD-10-CM

## 2023-11-09 LAB — LIPID PANEL

## 2023-11-09 MED ORDER — FLUCONAZOLE 150 MG PO TABS
ORAL_TABLET | ORAL | 2 refills | Status: DC
  Filled 2023-11-09: qty 3, 9d supply, fill #0
  Filled 2023-11-28 – 2023-12-15 (×2): qty 3, 9d supply, fill #1

## 2023-11-09 NOTE — Progress Notes (Signed)
[name], DNP, AGNP-c [facility] [address] [place], [place] [address] Main Office [phone]  BP 124/82   Pulse 77   Ht 5\' 2"  (1.575 m)   Wt 168 lb 3.2 oz (76.3 kg)   LMP 03/20/2016   BMI 30.76 kg/m    Subjective:    Patient ID: Michelle Ray, female    DOB: 14-Nov-1966, 57 y.o.   MRN: [id number]  HPI: Michelle Ray is a 57 y.o. female presenting on 11/09/2023 for comprehensive medical examination.   The patient, who has recently undergone significant weight loss through a nutrition-focused program, presents with persistent right foot pain. The pain began over the Thanksgiving holiday, following a day of household chores. The patient reports that the foot was swollen and painful, prompting her to seek care at an urgent care center in [place]. The medical team there was uncertain whether the issue was tendonitis or a detached ligament. The patient was placed in a boot for support, which she reports helps when she is working. However, she also notes a "horn" growing on the foot, which does not cause pain unless pressed hard.  In addition to the foot issue, the patient reports a decrease in overall pain since her weight loss, and a significant increase in energy. She has stopped taking hydrochlorothiazide and only takes Lasix when feeling particularly swollen. She is considering discontinuing her use of Mounjaro after her current supply runs out, as she feels her blood sugars are well-controlled. She is not having any symptoms of diabetes at this time. Due to excellent control of her blood sugars, she has not been checking her BG since her last visit.   The patient also reports some hearing difficulties and requests a referral to an audiologist.  She has a history of shingles and requests a shingles vaccine.  She also reports a skin issue under her belly, with irritation and pruritus.  The patient has a history of multiple injuries, including a recent MRI of her  leg revealing a meniscus tear and an old tear of the ACL. She is scheduled to see an orthopedic specialist for her foot issue and is concerned about the possibility of needing another surgery.   Pertinent items are noted in HPI.  IMMUNIZATIONS:   Flu Vaccine: Flu vaccine completed elsewhere this season. Record updated. Prevnar 13: Prevnar 13 N/A for this patient Prevnar 20: Prevnar 20 N/A for this patient Pneumovax 23: Pneumovax 23 N/A for this patient Vac Shingrix: Shingrix due, prescription provided HPV: N/A or Aged Out Tetanus: Tetanus completed in the last 10 years COVID: COVID completed, documentation in chart  RSV: No  HEALTH MAINTENANCE: Pap Smear HM Status: N/A Mammogram HM Status: is up to date Colon Cancer Screening HM Status: was ordered today Bone Density HM Status: N/A STI Testing HM Status: was declined  Lung CT HM Status: was ordered today  Concerns with vision, hearing, or dentition: Yes: hearing  Most Recent Depression Screen:     11/09/2023    9:35 AM 08/28/2022    8:21 AM 02/27/2022    8:12 AM 02/17/2021   11:13 AM  Depression screen PHQ 2/9  Decreased Interest 0 0 0 0  Down, Depressed, Hopeless 0 0 0 0  PHQ - 2 Score 0 0 0 0  Altered sleeping  0    Tired, decreased energy  0    Change in appetite  0    Feeling bad or failure about yourself   0    Trouble concentrating  0    Moving slowly or fidgety/restless  0    Suicidal thoughts  0    PHQ-9 Score  0     Most Recent Anxiety Screen:     08/28/2022    8:21 AM 02/17/2021   11:13 AM  GAD 7 : Generalized Anxiety Score  Nervous, Anxious, on Edge 0 0  Control/stop worrying 0 0  Worry too much - different things 0 0  Trouble relaxing 0 0  Restless 0 0  Easily annoyed or irritable 0 0  Afraid - awful might happen 0 0  Total GAD 7 Score 0 0  Anxiety Difficulty Not difficult at all    Most Recent Fall Screen:    11/09/2023    9:34 AM 05/24/2023    9:08 AM 10/29/2022    9:18 AM 08/28/2022    8:20  AM 02/27/2022    8:12 AM  Fall Risk   Falls in the past year? 0 0 0 1 0  Number falls in past yr: 0 0 0 1 0  Injury with Fall? 0 0 0 0 0  Risk for fall due to : No Fall Risks No Fall Risks No Fall Risks Impaired balance/gait No Fall Risks  Follow up Falls evaluation completed Falls evaluation completed Falls evaluation completed Falls evaluation completed;Education provided     Past medical history, surgical history, medications, allergies, family history and social history reviewed with patient today and changes made to appropriate areas of the chart.  Past Medical History:  Past Medical History:  Diagnosis Date   Allergy    Anemia    Aortic atherosclerosis (HCC)    Benign secondary hypertension due to renal artery stenosis (HCC)    Constipation    uses OTC stool softener prn only- this is not chronic   Diabetes mellitus without complication (HCC)    GERD (gastroesophageal reflux disease)    Hyperlipemia    Hyperlipidemia    Hypertension    Pain and swelling of right ankle 02/17/2021   Right upper quadrant abdominal pain 02/17/2021   Medications:  Current Outpatient Medications on File Prior to Visit  Medication Sig   albuterol (VENTOLIN HFA) 108 (90 Base) MCG/ACT inhaler Inhale 2 puffs into the lungs every 6 (six) hours as needed for wheezing or shortness of breath (Cough).   benazepril (LOTENSIN) 20 MG tablet Take 1 tablet (20 mg total) by mouth daily.   clobetasol ointment (TEMOVATE) 0.05 % Apply as directed-- twice daily x 2 weeks-- then once daily x 2 weeks-- then twice weekly   Continuous Glucose Sensor (FREESTYLE LIBRE 3 SENSOR) MISC Place 1 sensor on the skin every 14 days. Use to check glucose continuously   cyclobenzaprine (FLEXERIL) 10 MG tablet Take 1/2 - 1 tablet (5 - 10 mg total) by mouth 3 times daily as needed for muscle spasms.   DULoxetine (CYMBALTA) 20 MG capsule Take 2 capsules (40 mg total) by mouth daily.   esomeprazole (NEXIUM) 40 MG capsule Take 1 capsule  (40 mg total) by mouth daily.   estradiol (ESTRACE VAGINAL) 0.1 MG/GM vaginal cream Place 1 Applicatorful vaginally nightly for 2 weeks, then twice weekly therafter   ezetimibe (ZETIA) 10 MG tablet Take 1 tablet by mouth daily.   fenofibrate (TRICOR) 145 MG tablet Take 1 tablet (145 mg total) by mouth daily.   furosemide (LASIX) 40 MG tablet Take 1 tablet (40 mg total) by mouth daily.   tirzepatide (MOUNJARO) 15 MG/0.5ML Pen Inject 15 mg into the skin once  a week.   traMADol (ULTRAM) 50 MG tablet Take 1 tablet (50 mg total) by mouth every 8 (eight) hours as needed for moderate pain or severe pain.   Vitamin D, Ergocalciferol, (DRISDOL) 1.25 MG (50000 UNIT) CAPS capsule Take 1 capsule (50,000 Units total) by mouth every 7 (seven) days.   ondansetron (ZOFRAN) 8 MG tablet Take 1 tablet (8 mg total) by mouth every 8 (eight) hours as needed for nausea or vomiting. (Patient not taking: Reported on 11/09/2023)   Red Yeast Rice Extract (RED YEAST RICE PO) Take 2 capsules by mouth daily. (Patient not taking: Reported on 11/09/2023)   No current facility-administered medications on file prior to visit.   Surgical History:  Past Surgical History:  Procedure Laterality Date   ABLATION     ANKLE ARTHROSCOPY Right 05/25/2023   Procedure: RIGHT ANKLE ARTHROSCOPY WITH BROSTROM REPAIR;  Surgeon: Huel Cote, MD;  Location: [facility];  Service: Orthopedics;  Laterality: Right;   ENDOVENOUS ABLATION SAPHENOUS VEIN W/ LASER Right 01/28/2022   endovenous laser ablation right greater saphenous vein and stab phlebectomy 10-20 incisions right leg by Cari Caraway MD   TUBAL LIGATION     uterine fibroid removal     WISDOM TOOTH EXTRACTION     Allergies:  Allergies  Allergen Reactions   Crestor [Rosuvastatin]     Joint pain   Macrobid [Nitrofurantoin]    Pravastatin     Joint pain   Family History:  Family History  Problem Relation Age of Onset   Atrial fibrillation Mother    Pulmonary  embolism Mother    Breast cancer Mother 4   Heart failure Sister    Breast cancer Maternal Grandmother    Hypertension Maternal Grandmother    Heart disease Maternal Grandfather    Stroke Paternal Grandfather    Colon cancer Neg Hx    Colon polyps Neg Hx        Objective:    BP 124/82   Pulse 77   Ht 5\' 2"  (1.575 m)   Wt 168 lb 3.2 oz (76.3 kg)   LMP 03/20/2016   BMI 30.76 kg/m   Wt Readings from Last 3 Encounters:  11/09/23 168 lb 3.2 oz (76.3 kg)  09/03/23 187 lb 12.8 oz (85.2 kg)  05/25/23 204 lb 12.9 oz (92.9 kg)    Physical Exam Vitals and nursing note reviewed.  Constitutional:      General: She is not in acute distress.    Appearance: Normal appearance.  HENT:     Head: Normocephalic and atraumatic.     Right Ear: Hearing, tympanic membrane, ear canal and external ear normal.     Left Ear: Hearing, tympanic membrane, ear canal and external ear normal.     Nose: Nose normal.     Right Sinus: No maxillary sinus tenderness or frontal sinus tenderness.     Left Sinus: No maxillary sinus tenderness or frontal sinus tenderness.     Mouth/Throat:     Lips: Pink.     Mouth: Mucous membranes are moist.     Pharynx: Oropharynx is clear.  Eyes:     General: Lids are normal. Vision grossly intact.     Extraocular Movements: Extraocular movements intact.     Conjunctiva/sclera: Conjunctivae normal.     Pupils: Pupils are equal, round, and reactive to light.     Funduscopic exam:    Right eye: Red reflex present.        Left eye: Red reflex present.  Visual Fields: Right eye visual fields normal and left eye visual fields normal.  Neck:     Thyroid: No thyromegaly.     Vascular: No carotid bruit.  Cardiovascular:     Rate and Rhythm: Normal rate and regular rhythm.     Chest Wall: PMI is not displaced.     Pulses: Normal pulses.          Dorsalis pedis pulses are 2+ on the right side and 2+ on the left side.       Posterior tibial pulses are 2+ on the right side  and 2+ on the left side.     Heart sounds: Normal heart sounds. No murmur heard. Pulmonary:     Effort: Pulmonary effort is normal. No respiratory distress.     Breath sounds: Normal breath sounds.  Abdominal:     General: Abdomen is flat. Bowel sounds are normal. There is no distension.     Palpations: Abdomen is soft. There is no hepatomegaly, splenomegaly or mass.     Tenderness: There is no abdominal tenderness. There is no right CVA tenderness, left CVA tenderness, guarding or rebound.  Musculoskeletal:        General: Tenderness present. Normal range of motion.     Cervical back: Full passive range of motion without pain, normal range of motion and neck supple. No tenderness.     Right lower leg: No edema.     Left lower leg: No edema.     Comments: Right ankle and foot  Feet:     Left foot:     Toenail Condition: Left toenails are normal.  Lymphadenopathy:     Cervical: No cervical adenopathy.     Upper Body:     Right upper body: No supraclavicular adenopathy.     Left upper body: No supraclavicular adenopathy.  Skin:    General: Skin is warm and dry.     Capillary Refill: Capillary refill takes less than 2 seconds.     Nails: There is no clubbing.  Neurological:     General: No focal deficit present.     Mental Status: She is alert and oriented to person, place, and time.     GCS: GCS eye subscore is 4. GCS verbal subscore is 5. GCS motor subscore is 6.     Sensory: Sensation is intact.     Motor: Motor function is intact.     Coordination: Coordination is intact.     Gait: Gait is intact.     Deep Tendon Reflexes: Reflexes are normal and symmetric.  Psychiatric:        Attention and Perception: Attention normal.        Mood and Affect: Mood normal.        Speech: Speech normal.        Behavior: Behavior normal. Behavior is cooperative.        Thought Content: Thought content normal.        Cognition and Memory: Cognition and memory normal.        Judgment:  Judgment normal.     Results for orders placed or performed in visit on 11/09/23  CBC with Differential/Platelet   Collection Time: 11/09/23 11:13 AM  Result Value Ref Range   WBC 8.9 3.4 - 10.8 x10E3/uL   RBC 4.58 3.77 - 5.28 x10E6/uL   Hemoglobin 13.4 11.1 - 15.9 g/dL   Hematocrit 32.4 40.1 - 46.6 %   MCV 92 79 - 97 fL   MCH 29.3  26.6 - 33.0 pg   MCHC 32.0 31.5 - 35.7 g/dL   RDW 04.5 40.9 - 81.1 %   Platelets 446 150 - 450 x10E3/uL   Neutrophils 59 Not Estab. %   Lymphs 29 Not Estab. %   Monocytes 9 Not Estab. %   Eos 2 Not Estab. %   Basos 1 Not Estab. %   Neutrophils Absolute 5.2 1.4 - 7.0 x10E3/uL   Lymphocytes Absolute 2.6 0.7 - 3.1 x10E3/uL   Monocytes Absolute 0.8 0.1 - 0.9 x10E3/uL   EOS (ABSOLUTE) 0.2 0.0 - 0.4 x10E3/uL   Basophils Absolute 0.0 0.0 - 0.2 x10E3/uL   Immature Granulocytes 0 Not Estab. %   Immature Grans (Abs) 0.0 0.0 - 0.1 x10E3/uL  CMP14+EGFR   Collection Time: 11/09/23 11:13 AM  Result Value Ref Range   Glucose 83 70 - 99 mg/dL   BUN 28 (H) 6 - 24 mg/dL   Creatinine, Ser 9.14 0.57 - 1.00 mg/dL   eGFR 69 >78 GN/FAO/1.30   BUN/Creatinine Ratio 29 (H) 9 - 23   Sodium 141 134 - 144 mmol/L   Potassium 4.2 3.5 - 5.2 mmol/L   Chloride 102 96 - 106 mmol/L   CO2 21 20 - 29 mmol/L   Calcium 10.6 (H) 8.7 - 10.2 mg/dL   Total Protein 8.1 6.0 - 8.5 g/dL   Albumin 4.7 3.8 - 4.9 g/dL   Globulin, Total 3.4 1.5 - 4.5 g/dL   Bilirubin Total 0.4 0.0 - 1.2 mg/dL   Alkaline Phosphatase 48 44 - 121 IU/L   AST 21 0 - 40 IU/L   ALT 25 0 - 32 IU/L  Hemoglobin A1c   Collection Time: 11/09/23 11:13 AM  Result Value Ref Range   Hgb A1c MFr Bld 5.6 4.8 - 5.6 %   Est. average glucose Bld gHb Est-mCnc 114 mg/dL  Lipid panel   Collection Time: 11/09/23 11:13 AM  Result Value Ref Range   Cholesterol, Total 153 100 - 199 mg/dL   Triglycerides 865 0 - 149 mg/dL   HDL 57 >78 mg/dL   VLDL Cholesterol Cal 20 5 - 40 mg/dL   LDL Chol Calc (NIH) 76 0 - 99 mg/dL    Chol/HDL Ratio 2.7 0.0 - 4.4 ratio  VITAMIN D 25 Hydroxy (Vit-D Deficiency, Fractures)   Collection Time: 11/09/23 11:13 AM  Result Value Ref Range   Vit D, 25-Hydroxy 92.4 30.0 - 100.0 ng/mL       Assessment & Plan:   Problem List Items Addressed This Visit     Aortic atherosclerosis (HCC)   Chronic. Suspect improvement with significant weight management and lifestyle/dietary changes. Still taking fenofibrate, zetia, and red yeast rice. Labs pending.  -Continue with diet and exercise management.       Relevant Orders   CBC with Differential/Platelet (Completed)   CMP14+EGFR (Completed)   Hemoglobin A1c (Completed)   Lipid panel (Completed)   Hypertension associated with diabetes (HCC)   Well controlled off of medication with no alarm symptoms present. WIll continue to monitor.       Relevant Orders   CBC with Differential/Platelet (Completed)   CMP14+EGFR (Completed)   Hemoglobin A1c (Completed)   Controlled type 2 diabetes mellitus with complication, without long-term current use of insulin (HCC)   Significant weight loss achieved through [employer] program. Improved overall health and reduced need for medications. Blood pressure and blood sugar levels are well-controlled. Discontinued hydrochlorothiazide, taking Lasix as needed. Considering discontinuation of Mounjaro after current supply is exhausted. -  Continue [employer] program - Monitor blood pressure and blood sugar levels - Consider discontinuing Mounjaro after current supply is exhausted      Relevant Orders   CBC with Differential/Platelet (Completed)   CMP14+EGFR (Completed)   Hemoglobin A1c (Completed)   Hyperlipidemia associated with type 2 diabetes mellitus (HCC)   Labs pending. No medication changes. COntinue with weight management       Relevant Orders   CBC with Differential/Platelet (Completed)   CMP14+EGFR (Completed)   Hemoglobin A1c (Completed)   Lipid panel (Completed)   BMI 40.0-44.9, adult (HCC)    Significant weight loss achieved through [employer] program. Improved overall health and reduced need for medications. Blood pressure and blood sugar levels are well-controlled. Discontinued hydrochlorothiazide, taking Lasix as needed. Considering discontinuation of Mounjaro after current supply is exhausted. - Continue [employer] program - Monitor blood pressure and blood sugar levels - Consider discontinuing Mounjaro after current supply is exhausted      Relevant Orders   CBC with Differential/Platelet (Completed)   CMP14+EGFR (Completed)   Hemoglobin A1c (Completed)   Lipid panel (Completed)   Right ankle instability   Persistent right ankle pain since Thanksgiving, likely tendonitis or ligament detachment. Pain exacerbated by movement, with some relief from wearing a boot. Previous surgery on the ankle with concerns about the surgeon's competence. MRI planned to determine the exact cause. Range of motion limited, especially when moving to the right. Pain radiates to the pinky toe. - Continue wearing the boot as needed - Order MRI of the right ankle - Follow up with orthopedic specialist      Encounter for annual physical exam - Primary   CPE completed today. Review of HM activities and recommendations discussed and provided on AVS. Anticipatory guidance, diet, and exercise recommendations provided. Medications, allergies, and hx reviewed and updated as necessary. Orders placed as listed below.  Plan: - Labs ordered. Will make changes as necessary based on results.  - I will review these results and send recommendations via MyChart or a telephone call.  - F/U with CPE in 1 year or sooner for acute/chronic health needs as directed.        Decreased hearing of both ears   Reported difficulty hearing. No associated symptoms like fullness in the throat or vision changes. Requests referral to an audiologist. - Refer to audiologist      Relevant Orders   Ambulatory referral to Audiology    Candidal intertrigo   Skin irritation under the belly due to friction and moisture. Treated with topical agents. Using nystatin powder and requests Diflucan. 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 No current surgical intervention planned. Significant improvement in pain and inflammation with weight loss. - Monitor symptoms - Avoid activities that exacerbate pain      Vitamin D deficiency   Relevant Orders   VITAMIN D 25 Hydroxy (Vit-D Deficiency, Fractures) (Completed)   Other Visit Diagnoses       Former cigarette smoker       Relevant Orders   CT CHEST LUNG CA SCREEN LOW DOSE W/O CM     Screening for colon cancer       Relevant Orders   Ambulatory referral to Gastroenterology     Need for shingles vaccine       Relevant Orders  Zoster Recombinant (Shingrix ) (Completed)         Follow up plan: No follow-ups on file.  NEXT PREVENTATIVE PHYSICAL DUE IN 1 YEAR.  PATIENT COUNSELING PROVIDED FOR ALL ADULT PATIENTS: A well balanced diet low in saturated fats, cholesterol, and moderation in carbohydrates.  This can be as simple as monitoring portion sizes and cutting back on sugary beverages such as soda and juice to start with.    Daily water consumption of at least 64 ounces.  Physical activity at least 180 minutes per week.  If just starting out, start 10 minutes a day and work your way up.   This can be as simple as taking the stairs instead of the elevator and walking 2-3 laps around the office  purposefully every day.   STD protection, partner selection, and regular testing if high risk.  Limited consumption of alcoholic beverages if alcohol is consumed. For men, I recommend no more than 14 alcoholic beverages per week, spread out throughout the week (max 2 per day). Avoid "binge" drinking or  consuming large quantities of alcohol in one setting.  Please let me know if you feel you Vasconcelos need help with reduction or quitting alcohol consumption.   Avoidance of nicotine, if used. Please let me know if you feel you Baeten need help with reduction or quitting nicotine use.   Daily mental health attention. This can be in the form of 5 minute daily meditation, prayer, journaling, yoga, reflection, etc.  Purposeful attention to your emotions and mental state can significantly improve your overall wellbeing  and  Health.  Please know that I am here to help you with all of your health care goals and am happy to work with you to find a solution that works best for you.  The greatest advice I have received with any changes in life are to take it one step at a time, that even means if all you can focus on is the next 60 seconds, then do that and celebrate your victories.  With any changes in life, you will have set backs, and that is OK. The important thing to remember is, if you have a set back, it is not a failure, it is an opportunity to try again! Screening Testing Mammogram Every 1 -2 years based on history and risk factors Starting at age 74 Pap Smear Ages 21-39 every 3 years Ages 29-65 every 5 years with HPV testing More frequent testing Rennaker be required based on results and history Colon Cancer Screening Every 1-10 years based on test performed, risk factors, and history Starting at age 42 Bone Density Screening Every 2-10 years based on history Starting at age 45 for women Recommendations for men differ based on medication usage, history, and risk factors AAA Screening One time ultrasound Men 31-75 years old who have every smoked Lung Cancer Screening Low Dose Lung CT every 12 months Age 43-80 years with a 30 pack-year smoking history who still smoke or who have quit within the last 15 years   Screening Labs Routine  Labs: Complete Blood Count (CBC), Complete Metabolic Panel  (CMP), Cholesterol (Lipid Panel) Every 6-12 months based on history and medications Kirtz be recommended more frequently based on current conditions or previous results Hemoglobin A1c Lab Every 3-12 months based on history and previous results Starting at age 55 or earlier with diagnosis of diabetes, high cholesterol, BMI >26, and/or risk factors Frequent monitoring for patients with diabetes to ensure blood sugar control Thyroid Panel (  TSH) Every 6 months based on history, symptoms, and risk factors [place] be repeated more often if on medication HIV One time testing for all patients 69 and older [place] be repeated more frequently for patients with increased risk factors or exposure Hepatitis C One time testing for all patients 39 and older [place] be repeated more frequently for patients with increased risk factors or exposure Gonorrhea, Chlamydia Every 12 months for all sexually active persons 13-24 years Additional monitoring Garland be recommended for those who are considered high risk or who have symptoms Every 12 months for any woman on birth control, regardless of sexual activity PSA Men 53-78 years old with risk factors Additional screening Bark be recommended from age 63-69 based on risk factors, symptoms, and history  Vaccine Recommendations Tetanus Booster All adults every 10 years Flu Vaccine All patients 6 months and older every year COVID Vaccine All patients 12 years and older Initial dosing with booster Rosas recommend additional booster based on age and health history HPV Vaccine 2 doses all patients age 63-26 Dosing [place] be considered for patients over 26 Shingles Vaccine (Shingrix) 2 doses all adults 55 years and older Pneumonia (Pneumovax 64) All adults 65 years and older [place] recommend earlier dosing based on health history One year apart from Prevnar 55 Pneumonia (Prevnar 1) All adults 65 years and older Dosed 1 year after Pneumovax 23 Pneumonia (Prevnar 20) One time  alternative to the two dosing of 13 and 23 For all adults with initial dose of 23, 20 is recommended 1 year later For all adults with initial dose of 13, 23 is still recommended as second option 1 year later

## 2023-11-09 NOTE — Patient Instructions (Signed)
 For all adult patients, I recommend A well balanced diet low in saturated fats, cholesterol, and moderation in carbohydrates.   This can be as simple as monitoring portion sizes and cutting back on sugary beverages such as soda and juice to start with.    Daily water consumption of at least 64 ounces.  Physical activity at least 180 minutes per week, if just starting out.   This can be as simple as taking the stairs instead of the elevator and walking 2-3 laps around the office  purposefully every day.   STD protection, partner selection, and regular testing if high risk.  Limited consumption of alcoholic beverages if alcohol is consumed.  For women, I recommend no more than 7 alcoholic beverages per week, spread out throughout the week.  Avoid "binge" drinking or consuming large quantities of alcohol in one setting.   Please let me know if you feel you may need help with reduction or quitting alcohol consumption.   Avoidance of nicotine, if used.  Please let me know if you feel you may need help with reduction or quitting nicotine use.   Daily mental health attention.  This can be in the form of 5 minute daily meditation, prayer, journaling, yoga, reflection, etc.   Purposeful attention to your emotions and mental state can significantly improve your overall wellbeing  and  Health.  Please know that I am here to help you with all of your health care goals and am happy to work with you to find a solution that works best for you.  The greatest advice I have received with any changes in life are to take it one step at a time, that even means if all you can focus on is the next 60 seconds, then do that and celebrate your victories.  With any changes in life, you will have set backs, and that is OK. The important thing to remember is, if you have a set back, it is not a failure, it is an opportunity to try again!  Health Maintenance Recommendations Screening Testing Mammogram Every 1 -2  years based on history and risk factors Starting at age 40 Pap Smear Ages 21-39 every 3 years Ages 30-65 every 5 years with HPV testing More frequent testing may be required based on results and history Colon Cancer Screening Every 1-10 years based on test performed, risk factors, and history Starting at age 45 Bone Density Screening Every 2-10 years based on history Starting at age 65 for women Recommendations for men differ based on medication usage, history, and risk factors AAA Screening One time ultrasound Men 65-75 years old who have every smoked Lung Cancer Screening Low Dose Lung CT every 12 months Age 55-80 years with a 30 pack-year smoking history who still smoke or who have quit within the last 15 years  Screening Labs Routine  Labs: Complete Blood Count (CBC), Complete Metabolic Panel (CMP), Cholesterol (Lipid Panel) Every 6-12 months based on history and medications May be recommended more frequently based on current conditions or previous results Hemoglobin A1c Lab Every 3-12 months based on history and previous results Starting at age 45 or earlier with diagnosis of diabetes, high cholesterol, BMI >26, and/or risk factors Frequent monitoring for patients with diabetes to ensure blood sugar control Thyroid Panel (TSH w/ T3 & T4) Every 6 months based on history, symptoms, and risk factors May be repeated more often if on medication HIV One time testing for all patients 13 and older May be   repeated more frequently for patients with increased risk factors or exposure Hepatitis C One time testing for all patients 18 and older May be repeated more frequently for patients with increased risk factors or exposure Gonorrhea, Chlamydia Every 12 months for all sexually active persons 13-24 years Additional monitoring may be recommended for those who are considered high risk or who have symptoms PSA Men 40-54 years old with risk factors Additional screening may be  recommended from age 55-69 based on risk factors, symptoms, and history  Vaccine Recommendations Tetanus Booster All adults every 10 years Flu Vaccine All patients 6 months and older every year COVID Vaccine All patients 12 years and older Initial dosing with booster May recommend additional booster based on age and health history HPV Vaccine 2 doses all patients age 9-26 Dosing may be considered for patients over 26 Shingles Vaccine (Shingrix) 2 doses all adults 55 years and older Pneumonia (Pneumovax 23) All adults 65 years and older May recommend earlier dosing based on health history Pneumonia (Prevnar 13) All adults 65 years and older Dosed 1 year after Pneumovax 23  Additional Screening, Testing, and Vaccinations may be recommended on an individualized basis based on family history, health history, risk factors, and/or exposure.   

## 2023-11-10 LAB — CMP14+EGFR
ALT: 25 IU/L (ref 0–32)
AST: 21 IU/L (ref 0–40)
Albumin: 4.7 g/dL (ref 3.8–4.9)
Alkaline Phosphatase: 48 IU/L (ref 44–121)
BUN/Creatinine Ratio: 29 — ABNORMAL HIGH (ref 9–23)
BUN: 28 mg/dL — ABNORMAL HIGH (ref 6–24)
Bilirubin Total: 0.4 mg/dL (ref 0.0–1.2)
CO2: 21 mmol/L (ref 20–29)
Calcium: 10.6 mg/dL — ABNORMAL HIGH (ref 8.7–10.2)
Chloride: 102 mmol/L (ref 96–106)
Creatinine, Ser: 0.95 mg/dL (ref 0.57–1.00)
Globulin, Total: 3.4 g/dL (ref 1.5–4.5)
Glucose: 83 mg/dL (ref 70–99)
Potassium: 4.2 mmol/L (ref 3.5–5.2)
Sodium: 141 mmol/L (ref 134–144)
Total Protein: 8.1 g/dL (ref 6.0–8.5)
eGFR: 69 mL/min/{1.73_m2} (ref >59)

## 2023-11-10 LAB — CBC WITH DIFFERENTIAL/PLATELET
Basophils Absolute: 0 10*3/uL (ref 0.0–0.2)
Basos: 1 %
EOS (ABSOLUTE): 0.2 10*3/uL (ref 0.0–0.4)
Eos: 2 %
Hematocrit: 41.9 % (ref 34.0–46.6)
Hemoglobin: 13.4 g/dL (ref 11.1–15.9)
Immature Grans (Abs): 0 10*3/uL (ref 0.0–0.1)
Immature Granulocytes: 0 %
Lymphocytes Absolute: 2.6 10*3/uL (ref 0.7–3.1)
Lymphs: 29 %
MCH: 29.3 pg (ref 26.6–33.0)
MCHC: 32 g/dL (ref 31.5–35.7)
MCV: 92 fL (ref 79–97)
Monocytes Absolute: 0.8 10*3/uL (ref 0.1–0.9)
Monocytes: 9 %
Neutrophils Absolute: 5.2 10*3/uL (ref 1.4–7.0)
Neutrophils: 59 %
Platelets: 446 10*3/uL (ref 150–450)
RBC: 4.58 x10E6/uL (ref 3.77–5.28)
RDW: 13.8 % (ref 11.7–15.4)
WBC: 8.9 10*3/uL (ref 3.4–10.8)

## 2023-11-10 LAB — LIPID PANEL
Cholesterol, Total: 153 mg/dL (ref 100–199)
HDL: 57 mg/dL (ref >39)
LDL CALC COMMENT:: 2.7 ratio (ref 0.0–4.4)
LDL Chol Calc (NIH): 76 mg/dL (ref 0–99)
Triglycerides: 110 mg/dL (ref 0–149)
VLDL Cholesterol Cal: 20 mg/dL (ref 5–40)

## 2023-11-10 LAB — HEMOGLOBIN A1C
Est. average glucose Bld gHb Est-mCnc: 114 mg/dL
Hgb A1c MFr Bld: 5.6 % (ref 4.8–5.6)

## 2023-11-10 LAB — VITAMIN D 25 HYDROXY (VIT D DEFICIENCY, FRACTURES): Vit D, 25-Hydroxy: 92.4 ng/mL (ref 30.0–100.0)

## 2023-11-11 DIAGNOSIS — H9193 Unspecified hearing loss, bilateral: Secondary | ICD-10-CM | POA: Insufficient documentation

## 2023-11-11 DIAGNOSIS — Z0000 Encounter for general adult medical examination without abnormal findings: Secondary | ICD-10-CM | POA: Insufficient documentation

## 2023-11-11 DIAGNOSIS — M23206 Derangement of unspecified meniscus due to old tear or injury, right knee: Secondary | ICD-10-CM | POA: Insufficient documentation

## 2023-11-11 DIAGNOSIS — H6993 Unspecified Eustachian tube disorder, bilateral: Secondary | ICD-10-CM | POA: Insufficient documentation

## 2023-11-11 DIAGNOSIS — B372 Candidiasis of skin and nail: Secondary | ICD-10-CM | POA: Insufficient documentation

## 2023-11-11 NOTE — Assessment & Plan Note (Signed)
Skin irritation under the belly due to friction and moisture. Treated with topical agents. Using nystatin powder and requests Diflucan. - Prescribe Diflucan - Continue using nystatin powder

## 2023-11-11 NOTE — Assessment & Plan Note (Signed)

## 2023-11-11 NOTE — Assessment & Plan Note (Signed)
Persistent right ankle pain since Thanksgiving, likely tendonitis or ligament detachment. Pain exacerbated by movement, with some relief from wearing a boot. Previous surgery on the ankle with concerns about the surgeon's competence. MRI planned to determine the exact cause. Range of motion limited, especially when moving to the right. Pain radiates to the pinky toe. - Continue wearing the boot as needed - Order MRI of the right ankle - Follow up with orthopedic specialist

## 2023-11-11 NOTE — Assessment & Plan Note (Signed)
Labs pending. No medication changes. COntinue with weight management

## 2023-11-11 NOTE — Assessment & Plan Note (Signed)
Reported difficulty hearing. No associated symptoms like fullness in the throat or vision changes. Requests referral to an audiologist. - Refer to audiologist

## 2023-11-11 NOTE — Assessment & Plan Note (Signed)
Well controlled off of medication with no alarm symptoms present. WIll continue to monitor.

## 2023-11-11 NOTE — Assessment & Plan Note (Signed)
MRI shows a meniscus tear and an old ACL tear. No current surgical intervention planned. Significant improvement in pain and inflammation with weight loss. - Monitor symptoms - Avoid activities that exacerbate pain

## 2023-11-11 NOTE — Assessment & Plan Note (Signed)
Significant weight loss achieved through [employer]. Improved overall health and reduced need for medications. Blood pressure and blood sugar levels are well-controlled. Discontinued hydrochlorothiazide, taking Lasix as needed. Considering discontinuation of Mounjaro after current supply is exhausted. - Continue [employer] program - Monitor blood pressure and blood sugar levels - Consider discontinuing Mounjaro after current supply is exhausted

## 2023-11-12 NOTE — Assessment & Plan Note (Signed)
Chronic. Suspect improvement with significant weight management and lifestyle/dietary changes. Still taking fenofibrate, zetia, and red yeast rice. Labs pending.  -Continue with diet and exercise management.

## 2023-11-15 ENCOUNTER — Ambulatory Visit (HOSPITAL_BASED_OUTPATIENT_CLINIC_OR_DEPARTMENT_OTHER): Payer: Commercial Managed Care - PPO | Admitting: Orthopaedic Surgery

## 2023-11-17 ENCOUNTER — Ambulatory Visit (HOSPITAL_BASED_OUTPATIENT_CLINIC_OR_DEPARTMENT_OTHER): Payer: Commercial Managed Care - PPO | Admitting: Orthopaedic Surgery

## 2023-11-17 DIAGNOSIS — M19079 Primary osteoarthritis, unspecified ankle and foot: Secondary | ICD-10-CM | POA: Diagnosis not present

## 2023-11-17 NOTE — Progress Notes (Signed)
Post Operative Evaluation    Procedure/Date of Surgery: Right ankle Brostrm repair and ankle arthroscopy 6/25  Interval History:   Presents today for follow-up of the above procedure.  Overall she is having some tenderness about the midfoot where she does have a boss in that area.  She did recently have an episode of ankle swelling where [facility].  There was a question of gout at that time.  PMH/PSH/Family History/Social History/Meds/Allergies:    Past Medical History:  Diagnosis Date  . Allergy   . Anemia   . Aortic atherosclerosis (HCC)   . Benign secondary hypertension due to renal artery stenosis (HCC)   . Constipation    uses OTC stool softener prn only- this is not chronic  . Diabetes mellitus without complication (HCC)   . GERD (gastroesophageal reflux disease)   . Hyperlipemia   . Hyperlipidemia   . Hypertension   . Pain and swelling of right ankle 02/17/2021  . Right upper quadrant abdominal pain 02/17/2021   Past Surgical History:  Procedure Laterality Date  . ABLATION    . ANKLE ARTHROSCOPY Right 05/25/2023   Procedure: RIGHT ANKLE ARTHROSCOPY WITH BROSTROM REPAIR;  Surgeon: Huel Cote, MD;  Location: [facility];  Service: Orthopedics;  Laterality: Right;  . ENDOVENOUS ABLATION SAPHENOUS VEIN W/ LASER Right 01/28/2022   endovenous laser ablation right greater saphenous vein and stab phlebectomy 10-20 incisions right leg by Cari Caraway MD  . TUBAL LIGATION    . uterine fibroid removal    . WISDOM TOOTH EXTRACTION     Social History   Socioeconomic History  . Marital status: Married    Spouse name: Minerva Areola  . Number of children: Not on file  . Years of education: Not on file  . Highest education level: Master's degree (e.g., MA, MS, MEng, MEd, MSW, MBA)  Occupational History  . 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 Smoking status: Former     Passive exposure: Past  . Smokeless tobacco: Never  Vaping Use  . Vaping status: Never Used  Substance and Sexual Activity  . Alcohol use: No  . Drug use: No  . Sexual activity: Yes    Partners: Male    Birth control/protection: Post-menopausal    Comment: 1st intercourse- 10, partners- 5  Other Topics Concern  . 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 Married to Belk. Adult children.    Social Drivers of Health   Financial Resource Strain: Low Risk  (05/24/2023)   Overall Financial Resource Strain (CARDIA)   . Difficulty of Paying Living Expenses: Not hard at all  Food Insecurity: No Food Insecurity (05/24/2023)   Hunger Vital Sign   . Worried About Programme researcher, broadcasting/film/video in the Last Year: Never true   . Ran Out of Food in the Last Year: Never true  Transportation Needs: No Transportation Needs (05/24/2023)   PRAPARE - Transportation   . Lack of Transportation (Medical): No   . Lack of Transportation (Non-Medical): No  Physical Activity: Sufficiently Active (11/09/2023)   Exercise Vital Sign   . Days of Exercise per Week: 7 days   . Minutes of Exercise per Session: 40 min  Stress: No Stress Concern Present (05/24/2023)   [employer] of Occupational Health - Occupational Stress Questionnaire   . Feeling of Stress : Not at all  Social Connections: Socially Integrated (05/24/2023)   Social Connection and Isolation Panel [NHANES]   . Frequency of Communication with Friends and Family: More than three times a week   . Frequency of Social Gatherings with Friends and Family: Twice a week   . Attends Religious Services: More than 4 times per year   . Active Member of Clubs or Organizations: Yes   . Attends Banker Meetings: 1 to 4 times per year   . Marital Status: Married   Family History  Problem Relation Age of Onset  . Atrial fibrillation Mother   . Pulmonary embolism Mother   . Breast cancer Mother 73  . Heart failure  Sister   . Breast cancer Maternal Grandmother   . Hypertension Maternal Grandmother   . Heart disease Maternal Grandfather   . Stroke Paternal Grandfather   . Colon cancer Neg Hx   . Colon polyps Neg Hx    Allergies  Allergen Reactions  . Crestor [Rosuvastatin]     Joint pain  . Macrobid [Nitrofurantoin]   . Pravastatin     Joint pain   Current Outpatient Medications  Medication Sig Dispense Refill  . albuterol (VENTOLIN HFA) 108 (90 Base) MCG/ACT inhaler Inhale 2 puffs into the lungs every 6 (six) hours as needed for wheezing or shortness of breath (Cough). 6.7 g 0  . benazepril (LOTENSIN) 20 MG tablet Take 1 tablet (20 mg total) by mouth daily. 90 tablet 3  . clobetasol ointment (TEMOVATE) 0.05 % Apply as directed-- twice daily x 2 weeks-- then once daily x 2 weeks-- then twice weekly 30 g 2  . Continuous Glucose Sensor (FREESTYLE LIBRE 3 SENSOR) MISC Place 1 sensor on the skin every 14 days. Use to check glucose continuously 2 each 11  . cyclobenzaprine (FLEXERIL) 10 MG tablet Take 1/2 - 1 tablet (5 - 10 mg total) by mouth 3 times daily as needed for muscle spasms. 60 tablet 3  . DULoxetine (CYMBALTA) 20 MG capsule Take 2 capsules (40 mg total) by mouth daily. 180 capsule 3  . esomeprazole (NEXIUM) 40 MG capsule Take 1 capsule (40 mg total) by mouth daily. 90 capsule 3  . estradiol (ESTRACE VAGINAL) 0.1 MG/GM vaginal cream Place 1 Applicatorful vaginally nightly for 2 weeks, then twice weekly therafter 42.5 g 2  . ezetimibe (ZETIA) 10 MG tablet Take 1 tablet by mouth daily. 90 tablet 3  . fenofibrate (TRICOR) 145 MG tablet Take 1 tablet (145 mg total) by mouth daily. 90 tablet 3  . fluconazole (DIFLUCAN) 150 MG tablet Take one tablet by mouth every three days until completed. Fretz repeat if rash persists or returns 3 tablet 2  . furosemide (LASIX) 40 MG tablet Take 1 tablet (40 mg total) by mouth daily. 90 tablet 3  . ondansetron (ZOFRAN) 8 MG tablet Take 1 tablet (8 mg total) by  mouth every 8 (eight) hours as needed for nausea or vomiting. (Patient not taking: Reported on 11/09/2023) 30 tablet 3  . Red Yeast Rice Extract (RED YEAST RICE PO) Take 2 capsules by mouth daily. (Patient not taking: Reported on 11/09/2023)    . tirzepatide (MOUNJARO) 15 MG/0.5ML Pen Inject 15 mg into the skin once a week. 6 mL 3  . traMADol (ULTRAM) 50 MG tablet Take 1 tablet (50 mg total) by mouth every  8 (eight) hours as needed for moderate pain or severe pain. 60 tablet 2  . Vitamin D, Ergocalciferol, (DRISDOL) 1.25 MG (50000 UNIT) CAPS capsule Take 1 capsule (50,000 Units total) by mouth every 7 (seven) days. 12 capsule 3   No current facility-administered medications for this visit.   No results found.  Review of Systems:   A ROS was performed including pertinent positives and negatives as documented in the HPI.   Musculoskeletal Exam:     Presents with well-appearing wound.  No erythema or drainage.  Range of motion is 20 degrees dorsiflexion plantarflexion without pain.  No residual lateral laxity.  Left ankle with tenderness palpation about the PT tendon.  There is some medial joint line tenderness with mild Baker's cyst on the left.  Range of motion is from 0-130 on the left  Imaging:      I personally reviewed and interpreted the radiographs.   Assessment:   Status post right ankle Brostrm repair overall doing extremely well.  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  We did discuss treatment options including a rigid carbon fiber insole as well as an ultrasound-guided injection of this joint.  After further discussion she would like to pursue ultrasound-guided injection.  I will plan to see her back in follow-up in 2 months.  Plan :    -Right ultrasound talonavicular injection provided after verbal consent obtained    Procedure Note  Patient: Michelle Ray             Date of  Birth: March 15, 1966           MRN: [id number]             Visit Date: 11/17/2023  Procedures: Visit Diagnoses: No diagnosis found.  Small Joint Inj: R intertarsal on 11/17/2023 12:33 PM Details: ultrasound-guided         I personally saw and evaluated the patient, and participated in the management and treatment plan.  Huel Cote, MD Attending Physician, Orthopedic Surgery  This document was dictated using Dragon voice recognition software. A reasonable attempt at proof reading has been made to minimize errors.

## 2023-11-19 ENCOUNTER — Ambulatory Visit (HOSPITAL_COMMUNITY): Payer: Commercial Managed Care - PPO

## 2023-11-26 ENCOUNTER — Ambulatory Visit (HOSPITAL_COMMUNITY)
Admission: RE | Admit: 2023-11-26 | Discharge: 2023-11-26 | Disposition: A | Discharge status: Home / Self Care | Payer: Commercial Managed Care - PPO | Source: Ambulatory Visit | Attending: Nurse Practitioner | Admitting: Nurse Practitioner

## 2023-11-26 DIAGNOSIS — Z87891 Personal history of nicotine dependence: Secondary | ICD-10-CM | POA: Diagnosis not present

## 2023-11-28 ENCOUNTER — Other Ambulatory Visit: Payer: Self-pay | Admitting: Nurse Practitioner

## 2023-11-28 DIAGNOSIS — L900 Lichen sclerosus et atrophicus: Secondary | ICD-10-CM

## 2023-11-29 ENCOUNTER — Other Ambulatory Visit: Payer: Self-pay

## 2023-11-29 NOTE — Telephone Encounter (Signed)
Med refill request: Clobetasol  Last AEX: 10/08/2022-TW Next AEX: nothing scheduled, recall sent per EMR. Last MMG (if hormonal med): n/a Refill authorized: rx pend.  Msg sent to appt desk to schedule.

## 2023-12-06 ENCOUNTER — Other Ambulatory Visit (HOSPITAL_COMMUNITY): Payer: Self-pay

## 2023-12-06 MED ORDER — CLOBETASOL PROPIONATE 0.05 % EX OINT
1.0000 | TOPICAL_OINTMENT | CUTANEOUS | 0 refills | Status: AC
  Filled 2023-12-06: qty 30, 90d supply, fill #0

## 2023-12-06 NOTE — Telephone Encounter (Signed)
 Per appt desk: "11/29/23 LM to call"  No appt scheduled to date.

## 2023-12-09 NOTE — Telephone Encounter (Signed)
 12/07/2023:  Jennye Moccasin, CMA Message left for patient to call and schedule; patient has not called back."

## 2023-12-10 ENCOUNTER — Other Ambulatory Visit (HOSPITAL_COMMUNITY): Payer: Self-pay

## 2023-12-14 ENCOUNTER — Ambulatory Visit: Payer: Commercial Managed Care - PPO | Attending: Nurse Practitioner | Admitting: Audiologist

## 2023-12-14 DIAGNOSIS — H903 Sensorineural hearing loss, bilateral: Secondary | ICD-10-CM | POA: Insufficient documentation

## 2023-12-14 NOTE — Procedures (Signed)
  [facility] [address] [place], [place]  [address] [phone]  AUDIOLOGICAL  EVALUATION  NAME: Michelle Ray     DOB:   August 21, 1966      MRN: [id number]                                                                                     DATE: 12/14/2023     REFERENT: Oris Camie BRAVO, NP STATUS: Outpatient DIAGNOSIS: Mild sensorineural hearing loss bilateral  History: [name] was seen for an audiological evaluation due to difficulty hearing certain sounds like men's voices.  She feels in general her hearing is muffled.  [name] has never had a hearing test before. [name] denies pain, pressure, or tinnitus. [name] no history of hazardous noise exposure.  Medical history shows no risk for hearing loss.    Evaluation:  Otoscopy showed a clear view of the tympanic membranes, bilaterally Tympanometry results were consistent with normal but shallow middle ear function bilaterally Audiometric testing was completed using Conventional Audiometry techniques with insert earphones and supraural headphones. Test results are consistent with normal hearing sloping to a mild sensorineural hearing loss 4 through 8 kHz bilaterally.  Speech Recognition Thresholds were obtained at 15 dB HL in the right ear and at 15 dB HL in the left ear. Word Recognition Testing was completed at  40dB SL and Linell scored 100% in each ear.  Results:  The test results were reviewed with [name]  She has a very mild sensorineural high-frequency hearing loss.  She does not need hearing aids.  She will benefit from face-to-face communication to help her hear high-frequency consonants.  She can monitor her hearing loss every 2 years, or sooner if new concerns arise or symptoms change. Audiogram printed and provided to [name]    Recommendations: Monitor hearing loss for progression.   22 minutes spent testing and counseling on results.   If you have any questions please feel  free to contact me at [phone].  Lauraine Ka Au.D.  Audiologist   12/14/2023  11:42 AM  Cc: Early, Sara E, NP

## 2023-12-15 ENCOUNTER — Other Ambulatory Visit: Payer: Self-pay | Admitting: Nurse Practitioner

## 2023-12-15 ENCOUNTER — Other Ambulatory Visit (HOSPITAL_COMMUNITY): Payer: Self-pay

## 2023-12-15 ENCOUNTER — Ambulatory Visit: Payer: Commercial Managed Care - PPO | Admitting: Nurse Practitioner

## 2023-12-15 ENCOUNTER — Other Ambulatory Visit: Payer: Self-pay

## 2023-12-15 ENCOUNTER — Encounter: Payer: Self-pay | Admitting: Nurse Practitioner

## 2023-12-15 VITALS — BP 118/72 | HR 71 | Wt 157.8 lb

## 2023-12-15 DIAGNOSIS — H6993 Unspecified Eustachian tube disorder, bilateral: Secondary | ICD-10-CM

## 2023-12-15 DIAGNOSIS — N3090 Cystitis, unspecified without hematuria: Secondary | ICD-10-CM | POA: Diagnosis not present

## 2023-12-15 DIAGNOSIS — R300 Dysuria: Secondary | ICD-10-CM | POA: Diagnosis not present

## 2023-12-15 DIAGNOSIS — B372 Candidiasis of skin and nail: Secondary | ICD-10-CM

## 2023-12-15 DIAGNOSIS — R350 Frequency of micturition: Secondary | ICD-10-CM | POA: Diagnosis not present

## 2023-12-15 DIAGNOSIS — M797 Fibromyalgia: Secondary | ICD-10-CM

## 2023-12-15 LAB — POCT URINALYSIS DIP (CLINITEK)
Bilirubin, UA: NEGATIVE
Blood, UA: NEGATIVE
Glucose, UA: NEGATIVE mg/dL
Ketones, POC UA: NEGATIVE mg/dL
Nitrite, UA: NEGATIVE
POC PROTEIN,UA: NEGATIVE
Spec Grav, UA: <=1.005 — AB (ref 1.010–1.025)
Urobilinogen, UA: 0.2 U/dL
pH, UA: 6 (ref 5.0–8.0)

## 2023-12-15 MED ORDER — FLUCONAZOLE 150 MG PO TABS
ORAL_TABLET | ORAL | 2 refills | Status: AC
  Filled 2023-12-29: qty 3, 9d supply, fill #0
  Filled 2024-01-15: qty 3, 9d supply, fill #1
  Filled 2024-04-13: qty 3, 9d supply, fill #2

## 2023-12-15 MED ORDER — CIPROFLOXACIN HCL 500 MG PO TABS
500.0000 mg | ORAL_TABLET | Freq: Two times a day (BID) | ORAL | 0 refills | Status: AC
  Filled 2023-12-15: qty 10, 5d supply, fill #0

## 2023-12-15 MED ORDER — TRAMADOL HCL 50 MG PO TABS
50.0000 mg | ORAL_TABLET | Freq: Three times a day (TID) | ORAL | 2 refills | Status: DC | PRN
  Filled 2023-12-15: qty 60, 20d supply, fill #0
  Filled 2024-05-14: qty 60, 20d supply, fill #1

## 2023-12-15 NOTE — Progress Notes (Signed)
Tollie Eth, DNP, AGNP-c [facility] [address] [place], [place] [address] [phone]   ACUTE VISIT- ESTABLISHED PATIENT  Blood pressure 118/72, pulse 71, weight 157 lb 12.8 oz (71.6 kg), last menstrual period 03/20/2016.  Subjective:  HPI Michelle Ray is a 58 y.o. female presents to day for evaluation of acute concern(s).   R kidney and suprapubic tenderness The patient, with a history of hearing loss and urinary complaints, reports a recent audiology appointment where minimal hearing loss was identified. She does not require hearing aids, but the audiology test revealed significant congestion in the ears. 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   The patient also reports a burning sensation during urination and urinary incontinence. She describes the sensation as akin to 'peeing in her pants' and finds it distressing. The patient denies any hematuria. There is some tenderness in the suprapubic region and on the right side.  The patient's symptoms have not been associated with fever or swelling. She has not started any new medications and continues to take her prescribed medications. The patient has not reported any changes in her symptoms since her last consultation.  ROS negative except for what is listed in HPI. History, Medications, Surgery, SDOH, and Family History reviewed and updated as appropriate.  Objective:  Physical Exam Vitals and nursing note reviewed.  Constitutional:      Appearance: Normal appearance. She is not ill-appearing.  HENT:     Head: Normocephalic.  Eyes:     Conjunctiva/sclera: Conjunctivae normal.  Cardiovascular:     Rate and Rhythm: Normal rate and regular rhythm.  Pulmonary:     Effort: Pulmonary effort is normal.     Breath sounds: Normal breath sounds.  Abdominal:     Palpations: Abdomen is soft.     Tenderness: There is abdominal tenderness in the suprapubic area. There is right CVA tenderness. There is no left CVA  tenderness.  Musculoskeletal:        General: Normal range of motion.  Skin:    General: Skin is warm and dry.     Capillary Refill: Capillary refill takes less than 2 seconds.  Neurological:     General: No focal deficit present.     Mental Status: She is alert and oriented to person, place, and time.  Psychiatric:        Mood and Affect: Mood normal.          Assessment & Plan:   Problem List Items Addressed This Visit     Eustachian tube dysfunction, bilateral   Audiology report indicates minimal hearing loss and significant congestion. No immediate need for hearing aids. Discussed Flonase for reducing inflammation in eustachian tubes, noting 4-6 weeks for maximum effectiveness. Considered antihistamine as an alternative for congestion. - Recommend Flonase for 4-6 weeks - Consider antihistamine for congestion      Cystitis - Primary   Reports burning sensation and mild suprapubic tenderness. No hematuria. Previous cultures indicate sensitivity to Cipro. Discussed Cipro for 5 days, with option to stop after 3 days if symptoms resolve. Discussed potential for yeast infection and use of Diflucan. - Prescribe Cipro for 5 days, advise to stop if symptoms resolve after 3 days - Prescribe Diflucan for potential yeast infection      Candidal intertrigo   Relevant Medications   fluconazole (DIFLUCAN) 150 MG tablet   Other Visit Diagnoses       Frequent urination       Relevant Orders   POCT URINALYSIS DIP (CLINITEK) (  Completed)   Urine Culture (Completed)     Burning with urination       Relevant Orders   POCT URINALYSIS DIP (CLINITEK) (Completed)   Urine Culture (Completed)         Tollie Eth, DNP, AGNP-c

## 2023-12-15 NOTE — Patient Instructions (Signed)
 If your symptoms don't get better please let me know.

## 2023-12-15 NOTE — Telephone Encounter (Signed)
 Pt. Was in office today

## 2023-12-16 ENCOUNTER — Other Ambulatory Visit (HOSPITAL_COMMUNITY): Payer: Self-pay

## 2023-12-22 LAB — URINE CULTURE

## 2023-12-22 NOTE — Assessment & Plan Note (Signed)
Audiology report indicates minimal hearing loss and significant congestion. No immediate need for hearing aids. Discussed Flonase for reducing inflammation in eustachian tubes, noting 4-6 weeks for maximum effectiveness. Considered antihistamine as an alternative for congestion. - Recommend Flonase for 4-6 weeks - Consider antihistamine for congestion

## 2023-12-22 NOTE — Assessment & Plan Note (Signed)
Reports burning sensation and mild suprapubic tenderness. No hematuria. Previous cultures indicate sensitivity to Cipro. Discussed Cipro for 5 days, with option to stop after 3 days if symptoms resolve. Discussed potential for yeast infection and use of Diflucan. - Prescribe Cipro for 5 days, advise to stop if symptoms resolve after 3 days - Prescribe Diflucan for potential yeast infection

## 2023-12-27 ENCOUNTER — Other Ambulatory Visit (HOSPITAL_COMMUNITY): Payer: Self-pay

## 2023-12-29 ENCOUNTER — Other Ambulatory Visit: Payer: Self-pay

## 2023-12-29 ENCOUNTER — Other Ambulatory Visit (HOSPITAL_COMMUNITY): Payer: Self-pay

## 2024-01-08 ENCOUNTER — Telehealth: Payer: Self-pay | Admitting: Pulmonary Disease

## 2024-01-08 DIAGNOSIS — R0609 Other forms of dyspnea: Secondary | ICD-10-CM

## 2024-01-08 MED ORDER — STIOLTO RESPIMAT 2.5-2.5 MCG/ACT IN AERS
2.0000 | INHALATION_SPRAY | Freq: Every day | RESPIRATORY_TRACT | 2 refills | Status: AC
  Filled 2024-01-08: qty 4, 30d supply, fill #0
  Filled 2024-05-14: qty 4, 30d supply, fill #1

## 2024-01-08 NOTE — Telephone Encounter (Signed)
 Please schedule new appointment with Dr. Marygrace Snellen, next available. Self referral DOE. PFT ordered, please call patient and schedule next available. Thanks!

## 2024-01-09 ENCOUNTER — Other Ambulatory Visit: Payer: Self-pay

## 2024-01-10 ENCOUNTER — Other Ambulatory Visit: Payer: Commercial Managed Care - PPO

## 2024-01-10 ENCOUNTER — Other Ambulatory Visit (HOSPITAL_COMMUNITY): Payer: Self-pay

## 2024-01-11 NOTE — Telephone Encounter (Signed)
Oh - looks like scheduled same day - please disregard. Thanks!

## 2024-01-11 NOTE — Telephone Encounter (Signed)
Did PFT get scheduled? Thanks!

## 2024-01-12 ENCOUNTER — Other Ambulatory Visit (INDEPENDENT_AMBULATORY_CARE_PROVIDER_SITE_OTHER): Payer: Commercial Managed Care - PPO

## 2024-01-12 DIAGNOSIS — Z23 Encounter for immunization: Secondary | ICD-10-CM

## 2024-01-15 ENCOUNTER — Other Ambulatory Visit: Payer: Self-pay | Admitting: Nurse Practitioner

## 2024-01-15 DIAGNOSIS — E1169 Type 2 diabetes mellitus with other specified complication: Secondary | ICD-10-CM

## 2024-01-15 DIAGNOSIS — Z6841 Body Mass Index (BMI) 40.0 and over, adult: Secondary | ICD-10-CM

## 2024-01-15 DIAGNOSIS — E118 Type 2 diabetes mellitus with unspecified complications: Secondary | ICD-10-CM

## 2024-01-15 DIAGNOSIS — E1159 Type 2 diabetes mellitus with other circulatory complications: Secondary | ICD-10-CM

## 2024-01-17 ENCOUNTER — Other Ambulatory Visit (HOSPITAL_COMMUNITY): Payer: Self-pay

## 2024-01-17 ENCOUNTER — Other Ambulatory Visit: Payer: Self-pay

## 2024-01-17 MED ORDER — FREESTYLE LIBRE 3 SENSOR MISC
11 refills | Status: AC
  Filled 2024-01-17: qty 2, 28d supply, fill #0
  Filled 2024-02-15: qty 2, 28d supply, fill #1
  Filled 2024-03-15: qty 2, 28d supply, fill #2
  Filled 2024-04-13: qty 2, 28d supply, fill #3
  Filled 2024-05-14: qty 2, 28d supply, fill #4
  Filled 2024-07-03: qty 2, 28d supply, fill #5
  Filled 2024-09-06: qty 2, 28d supply, fill #6
  Filled 2024-10-12: qty 2, 28d supply, fill #7
  Filled 2024-11-20: qty 2, 28d supply, fill #8

## 2024-01-19 ENCOUNTER — Ambulatory Visit (HOSPITAL_BASED_OUTPATIENT_CLINIC_OR_DEPARTMENT_OTHER): Payer: Commercial Managed Care - PPO | Admitting: Orthopaedic Surgery

## 2024-01-19 ENCOUNTER — Encounter (HOSPITAL_BASED_OUTPATIENT_CLINIC_OR_DEPARTMENT_OTHER): Payer: Self-pay | Admitting: Orthopaedic Surgery

## 2024-01-19 DIAGNOSIS — G8929 Other chronic pain: Secondary | ICD-10-CM | POA: Diagnosis not present

## 2024-01-19 DIAGNOSIS — M25562 Pain in left knee: Secondary | ICD-10-CM | POA: Diagnosis not present

## 2024-01-19 NOTE — Progress Notes (Signed)
Post Operative Evaluation    Procedure/Date of Surgery: Right ankle Brostrm repair and ankle arthroscopy 6/25  Interval History:   Presents today for follow-up of the above procedure.  She is also here today for further discussion of the left knee as well as her left ankle.  She is having persistent medial based knee pain which is improved with weight loss although still persistent.  PMH/PSH/Family History/Social History/Meds/Allergies:    Past Medical History:  Diagnosis Date   Allergy    Anemia    Aortic atherosclerosis (HCC)    Benign secondary hypertension due to renal artery stenosis (HCC)    Constipation    uses OTC stool softener prn only- this is not chronic   Diabetes mellitus without complication (HCC)    GERD (gastroesophageal reflux disease)    Hyperlipemia    Hyperlipidemia    Hypertension    Pain and swelling of right ankle 02/17/2021   Right upper quadrant abdominal pain 02/17/2021   Past Surgical History:  Procedure Laterality Date   ABLATION     ANKLE ARTHROSCOPY Right 05/25/2023   Procedure: RIGHT ANKLE ARTHROSCOPY WITH BROSTROM REPAIR;  Surgeon: Huel Cote, MD;  Location: [facility];  Service: Orthopedics;  Laterality: Right;   ENDOVENOUS ABLATION SAPHENOUS VEIN W/ LASER Right 01/28/2022   endovenous laser ablation right greater saphenous vein and stab phlebectomy 10-20 incisions right leg by Cari Caraway MD   TUBAL LIGATION     uterine fibroid removal     WISDOM TOOTH EXTRACTION     Social History   Socioeconomic History   Marital status: Married    Spouse name: Minerva Areola   Number of children: Not on file   Years of education: Not on file   Highest education level: Master's degree (e.g., MA, MS, MEng, MEd, MSW, MBA)  Occupational History   Occupation: RN4    Employer: [employer]    Comment: ICU  Tobacco Use   Smoking status: Former    Passive exposure: Past   Smokeless tobacco: Never   Vaping Use   Vaping status: Never Used  Substance and Sexual Activity   Alcohol use: No   Drug use: No   Sexual activity: Yes    Partners: Male    Birth control/protection: Post-menopausal    Comment: 1st intercourse- 10, partners- 5  Other Topics Concern   Not on file  Social History Narrative   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 Adult children.    Social Drivers of Corporate investment banker Strain: Low Risk  (05/24/2023)   Overall Financial Resource Strain (CARDIA)    Difficulty of Paying Living Expenses: Not hard at all  Food Insecurity: No Food Insecurity (05/24/2023)   Hunger Vital Sign    Worried About Running Out of Food in the Last Year: Never true    Ran Out of Food in the Last Year: Never true  Transportation Needs: No Transportation Needs (05/24/2023)   PRAPARE - Administrator, Civil Service (Medical): No    Lack of Transportation (Non-Medical): No  Physical Activity: Sufficiently Active (11/09/2023)   Exercise Vital Sign    Days of Exercise per Week: 7 days    Minutes of Exercise per Session: 40 min  Stress: No Stress Concern Present (05/24/2023)   [place]  Institute of Occupational Health - Occupational Stress Questionnaire    Feeling of Stress : Not at all  Social Connections: Socially Integrated (05/24/2023)   Social Connection and Isolation Panel [NHANES]    Frequency of Communication with Friends and Family: More than three times a week    Frequency of Social Gatherings with Friends and Family: Twice a week    Attends Religious Services: More than 4 times per year    Active Member of [employer] or Organizations: Yes    Attends Banker Meetings: 1 to 4 times per year    Marital Status: Married   Family History  Problem Relation Age of Onset   Atrial fibrillation Mother    Pulmonary embolism Mother    Breast cancer Mother 34   Heart failure Sister    Breast cancer Maternal Grandmother    Hypertension Maternal  Grandmother    Heart disease Maternal Grandfather    Stroke Paternal Grandfather    Colon cancer Neg Hx    Colon polyps Neg Hx    Allergies  Allergen Reactions   Crestor [Rosuvastatin]     Joint pain   Macrobid [Nitrofurantoin]    Pravastatin     Joint pain   Current Outpatient Medications  Medication Sig Dispense Refill   albuterol (VENTOLIN HFA) 108 (90 Base) MCG/ACT inhaler Inhale 2 puffs into the lungs every 6 (six) hours as needed for wheezing or shortness of breath (Cough). 6.7 g 0   benazepril (LOTENSIN) 20 MG tablet Take 1 tablet (20 mg total) by mouth daily. 90 tablet 3   clobetasol ointment (TEMOVATE) 0.05 % Apply topically 2 (two) times a week. 30 g 0   Continuous Glucose Sensor (FREESTYLE LIBRE 3 SENSOR) MISC Place 1 sensor on the skin every 14 days. Use to check glucose continuously 2 each 11   cyclobenzaprine (FLEXERIL) 10 MG tablet Take 1/2 - 1 tablet (5 - 10 mg total) by mouth 3 times daily as needed for muscle spasms. 60 tablet 3   DULoxetine (CYMBALTA) 20 MG capsule Take 2 capsules (40 mg total) by mouth daily. 180 capsule 3   esomeprazole (NEXIUM) 40 MG capsule Take 1 capsule (40 mg total) by mouth daily. 90 capsule 3   estradiol (ESTRACE VAGINAL) 0.1 MG/GM vaginal cream Place 1 Applicatorful vaginally nightly for 2 weeks, then twice weekly therafter 42.5 g 2   ezetimibe (ZETIA) 10 MG tablet Take 1 tablet by mouth daily. 90 tablet 3   fenofibrate (TRICOR) 145 MG tablet Take 1 tablet (145 mg total) by mouth daily. 90 tablet 3   fluconazole (DIFLUCAN) 150 MG tablet Take one tablet by mouth every three days until completed. Jurek repeat if rash persists or returns 3 tablet 2   furosemide (LASIX) 40 MG tablet Take 1 tablet (40 mg total) by mouth daily. 90 tablet 3   ondansetron (ZOFRAN) 8 MG tablet Take 1 tablet (8 mg total) by mouth every 8 (eight) hours as needed for nausea or vomiting. 30 tablet 3   Tiotropium Bromide-Olodaterol (STIOLTO RESPIMAT) 2.5-2.5 MCG/ACT AERS  Inhale 2 puffs into the lungs daily. 4 g 2   tirzepatide (MOUNJARO) 15 MG/0.5ML Pen Inject 15 mg into the skin once a week. 6 mL 3   traMADol (ULTRAM) 50 MG tablet Take 1 tablet (50 mg total) by mouth every 8 (eight) hours as needed for moderate pain (pain score 4-6) or severe pain (pain score 7-10). 60 tablet 2   Vitamin D, Ergocalciferol, (DRISDOL) 1.25 MG (50000 UNIT)  CAPS capsule Take 1 capsule (50,000 Units total) by mouth every 7 (seven) days. 12 capsule 3   No current facility-administered medications for this visit.   No results found.  Review of Systems:   A ROS was performed including pertinent positives and negatives as documented in the HPI.   Musculoskeletal Exam:     Presents with well-appearing wound.  No erythema or drainage.  Range of motion is 20 degrees dorsiflexion plantarflexion without pain.  No residual lateral laxity.  Left ankle with tenderness palpation about the PT tendon.  There is some medial joint line tenderness with mild Baker's cyst on the left.  Range of motion is from 0-130 on the left  Imaging:    MRI left ankle, left knee: Medial tibiofemoral osteoarthritis with evidence of a previous medial meniscal tear, extension normal left ankle   I personally reviewed and interpreted the radiographs.   Assessment:   Status post right ankle Brostrm repair overall doing extremely well.  Overall I did discuss that she does have mild to moderate osteoarthritis involving predominantly the tibiofemoral joint of the left knee.  She has been losing weight which has been significantly helpful in reducing her pain although I do ultimately believe that she would benefit from additional treatment.  She is hoping to avoid any type of surgery and given that I have recommended referral to my partner Dr. Shon Baton for discussion of PRP of the left knee.  She would like to proceed with that.  I will plan to see her back as needed  Plan :    -Return to clinic as  needed     I personally saw and evaluated the patient, and participated in the management and treatment plan.  Huel Cote, MD Attending Physician, Orthopedic Surgery  This document was dictated using Dragon voice recognition software. A reasonable attempt at proof reading has been made to minimize errors.

## 2024-01-21 ENCOUNTER — Ambulatory Visit (HOSPITAL_BASED_OUTPATIENT_CLINIC_OR_DEPARTMENT_OTHER): Payer: Commercial Managed Care - PPO | Admitting: Orthopaedic Surgery

## 2024-01-27 DIAGNOSIS — F4321 Adjustment disorder with depressed mood: Secondary | ICD-10-CM | POA: Diagnosis not present

## 2024-02-07 ENCOUNTER — Encounter: Payer: Self-pay | Admitting: Sports Medicine

## 2024-02-07 ENCOUNTER — Ambulatory Visit (INDEPENDENT_AMBULATORY_CARE_PROVIDER_SITE_OTHER): Payer: Commercial Managed Care - PPO | Admitting: Sports Medicine

## 2024-02-07 DIAGNOSIS — M25562 Pain in left knee: Secondary | ICD-10-CM

## 2024-02-07 DIAGNOSIS — G8929 Other chronic pain: Secondary | ICD-10-CM | POA: Diagnosis not present

## 2024-02-07 DIAGNOSIS — M1712 Unilateral primary osteoarthritis, left knee: Secondary | ICD-10-CM

## 2024-02-07 DIAGNOSIS — F4321 Adjustment disorder with depressed mood: Secondary | ICD-10-CM | POA: Diagnosis not present

## 2024-02-07 NOTE — Progress Notes (Signed)
 Patient says that she has had constant left knee pain for years. She says that she has had an injection in the past which helped, as well as physical therapy. She says that ice does help when she uses it. She takes Tylenol and Ibuprofen every morning, and on the days where her pain is really bad she takes Tramadol but says that she doesn't typically take that more than once a week. She uses compression sleeves when exercising and occasionally has to wear these at work.

## 2024-02-07 NOTE — Progress Notes (Signed)
 Michelle Ray - 57 y.o. female MRN [id number]  Date of birth: 02-Jan-1966  Office Visit Note: Visit Date: 02/07/2024 PCP: Tollie Eth, NP Referred by: Huel Cote, MD  Subjective: Chief Complaint  Patient presents with   Left Knee - Pain   HPI: Michelle Ray is a pleasant 58 y.o. female who presents today for evaluation of chronic left knee pain.   She has had constant left knee pain for years.  Back in October of last year she did have a steroid injection which felt excellent for the knee.  She also has done physical therapy.  She uses Tylenol and ibuprofen every morning.  She has seen my partner Dr. Steward Drone for the knee and had an MRI which showed high-grade cartilage loss in the medial knee and a prior meniscal injury.  She is interested in discussing PRP and long-term solutions to avoid knee replacement.  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  Pertinent ROS were reviewed with the patient and found to be negative unless otherwise specified above in HPI.   Assessment & Plan: Visit Diagnoses:  1. Chronic pain of left knee   2. Unilateral primary osteoarthritis, left knee    Plan: Impression is chronic left knee pain with high-grade cartilage loss, most notable in the medial tibiofemoral component.  She is interested in joint preservation modalities to help improve her pain and function.  We had a discussion regarding PRP injection therapy as well as pre and postinjection protocol.  She is interested in proceeding with this.  She will need to stop her ibuprofen and all NSAIDs 10 days prior to the injection in 2 weeks following this, this was discussed today.  Given the degree of cartilage loss, we will proceed with PRP injection and second injection 2 weeks following this, then follow-up at 10-week mark. Will plan to get her into PT or home rehab program then following injection therapy.   Follow-up: Return for Schedule for PRP L-knee > 10 days from today.    Meds & Orders: No orders of the defined types were placed in this encounter.  No orders of the defined types were placed in this encounter.    Procedures: No procedures performed      Clinical History: No specialty comments available.  She reports that she has quit smoking. She has been exposed to tobacco smoke. She has never used smokeless tobacco.  Recent Labs    05/24/23 0955 11/09/23 1113  HGBA1C 5.5 5.6    Objective:   Vital Signs: LMP 03/20/2016   Physical Exam  Gen: Well-appearing, in no acute distress; non-toxic CV: Well-perfused. Warm.  Resp: Breathing unlabored on room air; no wheezing. Psych: Fluid speech in conversation; appropriate affect; normal thought process  Ortho Exam - Left knee: There is a small effusion on the knee.  Positive TTP over the medial joint line.  There is patellar crepitus noted.  Range of motion from about 3-120 degrees.  Imaging:  Narrative & Impression  CLINICAL DATA:  No known injury, anterior posterior knee pain   EXAM: MRI OF THE LEFT KNEE WITHOUT CONTRAST   TECHNIQUE: Multiplanar, multisequence MR imaging of the knee was performed. No intravenous contrast was administered.   COMPARISON:  None Available.   FINDINGS: MENISCI   Medial: Radial tear of the posterior horn of the medial meniscus towards the root with peripheral meniscal extrusion.   Lateral: Intact.   LIGAMENTS   Cruciates: Complete chronic ACL tear.  Intact PCL.   Collaterals: Medial collateral ligament is intact. Lateral collateral ligament complex is intact.   CARTILAGE   Patellofemoral: Moderate partial-thickness cartilage loss of the patellofemoral compartment.   Medial: Extensive full-thickness cartilage loss of the medial femorotibial compartment with subchondral marrow edema.   Lateral: Mild partial-thickness cartilage loss of the lateral femorotibial compartment.   JOINT: No joint effusion. Normal Hoffa's fat-pad. No  plical thickening.   POPLITEAL FOSSA: Popliteus tendon is intact. No Baker's cyst.   EXTENSOR MECHANISM: Intact quadriceps tendon. Intact patellar tendon. Intact lateral patellar retinaculum. Intact medial patellar retinaculum. Intact MPFL.   BONES: No aggressive osseous lesion. No fracture or dislocation. Severe subcortical marrow edema at the base of the lateral tibial eminence.   Other: No fluid collection or hematoma. Muscles are normal.   IMPRESSION: 1. Radial tear of the posterior horn of the medial meniscus towards the root with peripheral meniscal extrusion. 2. Complete chronic ACL tear. 3. Tricompartmental cartilage abnormalities as described above most severe in the medial femorotibial compartment.     Electronically Signed   By: Elige Ko M.D.   On: 10/17/2023 07:35    Past Medical/Family/Surgical/Social History: Medications & Allergies reviewed per EMR, new medications updated. Patient Active Problem List   Diagnosis Date Noted   Cystitis 12/15/2023   Encounter for annual physical exam 11/11/2023   Eustachian tube dysfunction, bilateral 11/11/2023   Candidal intertrigo 11/11/2023   Old tear of meniscus of right knee 11/11/2023   Gastroesophageal reflux disease 06/29/2023   Bilateral leg edema 06/29/2023   Vaginal atrophy 06/29/2023   Vitamin D deficiency 06/02/2023   Right ankle instability 05/25/2023   RLS (restless legs syndrome) 03/06/2022   Fibromyalgia muscle pain 03/06/2022   Atrophy of right kidney 08/29/2021   Varicose veins of both legs with edema 02/17/2021   Stress incontinence in female 02/17/2021   Controlled type 2 diabetes mellitus with complication, without long-term current use of insulin (HCC) 02/17/2021   Hyperlipidemia associated with type 2 diabetes mellitus (HCC) 02/17/2021   BMI 40.0-44.9, adult (HCC) 02/17/2021   Aortic atherosclerosis (HCC) 02/17/2021   Hypertension associated with diabetes (HCC) 05/29/2016   Past Medical  History:  Diagnosis Date   Allergy    Anemia    Aortic atherosclerosis (HCC)    Benign secondary hypertension due to renal artery stenosis (HCC)    Constipation    uses OTC stool softener prn only- this is not chronic   Diabetes mellitus without complication (HCC)    GERD (gastroesophageal reflux disease)    Hyperlipemia    Hyperlipidemia    Hypertension    Pain and swelling of right ankle 02/17/2021   Right upper quadrant abdominal pain 02/17/2021   Family History  Problem Relation Age of Onset   Atrial fibrillation Mother    Pulmonary embolism Mother    Breast cancer Mother 20   Heart failure Sister    Breast cancer Maternal Grandmother    Hypertension Maternal Grandmother    Heart disease Maternal Grandfather    Stroke Paternal Grandfather    Colon cancer Neg Hx    Colon polyps Neg Hx    Past Surgical History:  Procedure Laterality Date   ABLATION     ANKLE ARTHROSCOPY Right 05/25/2023   Procedure: RIGHT ANKLE ARTHROSCOPY WITH BROSTROM REPAIR;  Surgeon: Huel Cote, MD;  Location: [facility];  Service: Orthopedics;  Laterality: Right;   ENDOVENOUS ABLATION SAPHENOUS VEIN W/ LASER Right 01/28/2022   endovenous laser ablation right greater saphenous  vein and stab phlebectomy 10-20 incisions right leg by Cari Caraway MD   TUBAL LIGATION     uterine fibroid removal     WISDOM TOOTH EXTRACTION     Social History   Occupational History   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

## 2024-02-14 DIAGNOSIS — F4321 Adjustment disorder with depressed mood: Secondary | ICD-10-CM | POA: Diagnosis not present

## 2024-02-15 ENCOUNTER — Other Ambulatory Visit (HOSPITAL_COMMUNITY): Payer: Self-pay

## 2024-02-16 ENCOUNTER — Encounter: Payer: Self-pay | Admitting: Sports Medicine

## 2024-02-16 ENCOUNTER — Other Ambulatory Visit: Payer: Self-pay

## 2024-02-16 ENCOUNTER — Ambulatory Visit: Admitting: Sports Medicine

## 2024-02-16 DIAGNOSIS — M25562 Pain in left knee: Secondary | ICD-10-CM

## 2024-02-16 DIAGNOSIS — G8929 Other chronic pain: Secondary | ICD-10-CM

## 2024-02-16 DIAGNOSIS — M1712 Unilateral primary osteoarthritis, left knee: Secondary | ICD-10-CM

## 2024-02-16 NOTE — Progress Notes (Addendum)
   Procedure Note  Patient: Michelle Ray             Date of Birth: [birth year]           MRN: [id number]             Visit Date: 02/16/2024  Procedures: Visit Diagnoses:  1. Unilateral primary osteoarthritis, left knee   2. Chronic pain of left knee    Ultrasound-guided PRP Knee Injection, Left: After discussion on risks/benefits/indications, informed verbal consent was obtained and a timeout was performed. The patient was lying supine on exam table with knee bolster underneath affected knee for comfort. The patient's right knee was prepped with Chloraprep and alcohol swabs. Utilizing ultrasound-guidance with the probe in a transverse position, the patient's suprapatellar bursa was identified and the surrounding soft tissue area was anesthesized first with a 25G, 1.5" needle with approximately 3 cc of lidocaine 1% using sterile technique, but none was delivered into the knee joint or bursa itself. Following this, using ultrasound guidance via an in-plane approach, a 22G, 1.5" needle was directed into the suprapatellar pouch of the knee joint and subsequently injected intraarticularly with 5.75 cc of platelet-rich-plasma (leukocyte-poor). Visualization of injectate spread within the knee joint was visualized dynamically under ultrasound guidance. Patient tolerated the procedure well without immediate complications.   Kit: RegenLab: RegenPlasma; BCT-3 kit   *Post-PRP Injection Guidelines: No anti-inflammatories (ibuprofen/motrin, aleve, meloxicam, etc.) for 2 weeks.  No ice for 2 weeks.  Short prescription of tramadol Carver be written if pain is severe, call if needed. Appropriate rest / bracing / and timeframe for beginning therapy discussed and patient endorsed understanding.   - she will f/u in 2 weeks for her 2nd consecutive PRP injection - post-injection protocol discussed and handout provided  Madelyn Brunner, DO Primary Care Sports Medicine Physician  [facility] - Orthopedics  This  note was dictated using Dragon naturally speaking software and Schoene contain errors in syntax, spelling, or content which have not been identified prior to signing this note.

## 2024-02-21 ENCOUNTER — Ambulatory Visit: Payer: Commercial Managed Care - PPO | Admitting: Nurse Practitioner

## 2024-03-02 ENCOUNTER — Encounter: Payer: Self-pay | Admitting: Sports Medicine

## 2024-03-02 ENCOUNTER — Other Ambulatory Visit: Payer: Self-pay

## 2024-03-02 ENCOUNTER — Ambulatory Visit: Admitting: Sports Medicine

## 2024-03-02 DIAGNOSIS — G8929 Other chronic pain: Secondary | ICD-10-CM

## 2024-03-02 DIAGNOSIS — M25562 Pain in left knee: Secondary | ICD-10-CM

## 2024-03-02 DIAGNOSIS — M1712 Unilateral primary osteoarthritis, left knee: Secondary | ICD-10-CM

## 2024-03-02 NOTE — Progress Notes (Signed)
   Procedure Note  Patient: Michelle Ray             Date of Birth: December 15, 1965           MRN: [id number]             Visit Date: 03/02/2024  Procedures: Visit Diagnoses:  1. Unilateral primary osteoarthritis, left knee   2. Chronic pain of left knee    Ultrasound-guided PRP Knee Injection, left: After discussion on risks/benefits/indications, informed verbal consent was obtained and a timeout was performed. The patient was lying supine on exam table with knee bolster underneath affected knee for comfort. The patient's right knee was prepped with Chloraprep and alcohol swabs. Utilizing ultrasound-guidance with the probe in a transverse position, the patient's suprapatellar bursa was identified and the surrounding soft tissue area was anesthesized first with a 25G, 1.5" needle with approximately 3 cc of lidocaine 1% using sterile technique, but none was delivered into the knee joint or bursa itself. Following this, using ultrasound guidance via an in-plane approach, a 22G, 1.5" needle was directed into the suprapatellar pouch of the knee joint and subsequently injected intraarticularly with 5.75 cc of platelet-rich-plasma (leukocyte-poor). Visualization of injectate spread within the knee joint was visualized dynamically under ultrasound guidance. Patient tolerated the procedure well without immediate complications.   Kit: RegenLab: RegenPlasma; BCT-3 kit   *Post-PRP Injection Guidelines: No anti-inflammatories (ibuprofen/motrin, aleve, meloxicam, etc.) for 2 weeks.  No ice for 2 weeks.  Short prescription of tramadol Dorian be written if pain is severe, call if needed. Appropriate rest / bracing / and timeframe for beginning therapy discussed and patient endorsed understanding. Fellman begin walking on flat surfaces at 1-week.  - explained maximum benefit is usually reached between the 6-12 week mark, Lesesne follow-up between the 2-3 month mark if desires. If certainly helping, but not all the way improved,  can consider a 3rd injection around that 10-12 week mark.  Madelyn Brunner, DO Primary Care Sports Medicine Physician  [facility] - Orthopedics  This note was dictated using Dragon naturally speaking software and [place] contain errors in syntax, spelling, or content which have not been identified prior to signing this note.

## 2024-03-16 ENCOUNTER — Other Ambulatory Visit (HOSPITAL_COMMUNITY): Payer: Self-pay

## 2024-03-27 ENCOUNTER — Ambulatory Visit: Payer: Commercial Managed Care - PPO | Admitting: Pulmonary Disease

## 2024-03-27 ENCOUNTER — Encounter: Payer: Self-pay | Admitting: Pulmonary Disease

## 2024-03-27 ENCOUNTER — Ambulatory Visit (INDEPENDENT_AMBULATORY_CARE_PROVIDER_SITE_OTHER): Payer: Commercial Managed Care - PPO | Admitting: Pulmonary Disease

## 2024-03-27 VITALS — BP 124/80 | HR 70 | Temp 98.2°F | Resp 18 | Ht 63.0 in | Wt 147.0 lb

## 2024-03-27 DIAGNOSIS — Z7722 Contact with and (suspected) exposure to environmental tobacco smoke (acute) (chronic): Secondary | ICD-10-CM | POA: Diagnosis not present

## 2024-03-27 DIAGNOSIS — J439 Emphysema, unspecified: Secondary | ICD-10-CM

## 2024-03-27 DIAGNOSIS — Z87891 Personal history of nicotine dependence: Secondary | ICD-10-CM

## 2024-03-27 DIAGNOSIS — R0609 Other forms of dyspnea: Secondary | ICD-10-CM

## 2024-03-27 DIAGNOSIS — J432 Centrilobular emphysema: Secondary | ICD-10-CM

## 2024-03-27 LAB — PULMONARY FUNCTION TEST
DL/VA % pred: 104 %
DL/VA: 4.44 ml/min/mmHg/L
DLCO unc % pred: 123 %
DLCO unc: 24.31 ml/min/mmHg
FEF 25-75 Post: 2.01 L/s
FEF 25-75 Pre: 1.77 L/s
FEF2575-%Change-Post: 13 %
FEF2575-%Pred-Post: 86 %
FEF2575-%Pred-Pre: 75 %
FEV1-%Change-Post: 5 %
FEV1-%Pred-Post: 103 %
FEV1-%Pred-Pre: 98 %
FEV1-Post: 2.58 L
FEV1-Pre: 2.45 L
FEV1FVC-%Change-Post: 5 %
FEV1FVC-%Pred-Pre: 90 %
FEV6-%Change-Post: 0 %
FEV6-%Pred-Post: 111 %
FEV6-%Pred-Pre: 110 %
FEV6-Post: 3.44 L
FEV6-Pre: 3.44 L
FEV6FVC-%Change-Post: 0 %
FEV6FVC-%Pred-Post: 103 %
FEV6FVC-%Pred-Pre: 103 %
FVC-%Change-Post: 0 %
FVC-%Pred-Post: 107 %
FVC-%Pred-Pre: 107 %
FVC-Post: 3.46 L
FVC-Pre: 3.46 L
Post FEV1/FVC ratio: 75 %
Post FEV6/FVC ratio: 100 %
Pre FEV1/FVC ratio: 71 %
Pre FEV6/FVC Ratio: 99 %
RV % pred: 128 %
RV: 2.47 L
TLC % pred: 121 %
TLC: 5.96 L

## 2024-03-27 NOTE — Patient Instructions (Signed)
 Full pft performed today.

## 2024-03-27 NOTE — Progress Notes (Signed)
 Full pft performed today.

## 2024-03-27 NOTE — Patient Instructions (Signed)
 Try Stiolto 2 puffs once a day for good months  See if that helps when you are exercising or at work etc.  If no help then we can stop it.  Pulmonary function test suggest it Pennie help with the limited air trapping and hyperinflation.  That being said we could always try different delivery method such as a dry powder or a regular "puffer."  Just let me know.  Pulmonary function test with no COPD, the emphysema on CT is very mild.  Overall I think all good news.  Return to clinic as needed

## 2024-03-27 NOTE — Progress Notes (Signed)
 @Patient  ID: Michelle Ray, female    DOB: [birth year], 58 y.o.   MRN: [id number]  Chief Complaint  Patient presents with   Consult    SOB    Referring provider: Annella Kief, NP  HPI:   58 y.o. woman whom we are seeing for evaluation of emphysema on CT scan.  Most recent PCP note reviewed.  Overall doing well.  Regular exercises.  Some mild dyspnea with exertion.  Prescribed Stiolto.  She is not sure it helps very much.  She is willing to try again.  We reviewed PFTs today.  No COPD, hyperinflation and air trapping present.  Poss related to mild emphysema.  Also with elevated DLCO suspect question of underlying small airways disease which can also contribute to these findings.  Reviewed CT scan of the chest 10/2023, interpreted as clear lungs, very mild emphysematous changes noted throughout the upper lobes with significant improvement inferiorly.  Questionaires / Pulmonary Flowsheets:   ACT:      No data to display          MMRC:     No data to display          Epworth:      No data to display          Tests:   FENO:  No results found for: "NITRICOXIDE"  PFT:    Latest Ref Rng & Units 03/27/2024    8:56 AM  PFT Results  FVC-Pre L 3.46  P  FVC-Predicted Pre % 107  P  FVC-Post L 3.46  P  FVC-Predicted Post % 107  P  Pre FEV1/FVC % % 71  P  Post FEV1/FCV % % 75  P  FEV1-Pre L 2.45  P  FEV1-Predicted Pre % 98  P  FEV1-Post L 2.58  P  DLCO uncorrected ml/min/mmHg 24.31  P  DLCO UNC% % 123  P  DLVA Predicted % 104  P  TLC L 5.96  P  TLC % Predicted % 121  P  RV % Predicted % 128  P    P Preliminary result  Personally viewed interpreted as normal spirometry, lung volumes consistent with hyperinflation and air trapping.  DLCO within normal limits, elevated.  WALK:      No data to display          Imaging: Personally reviewed as per EMR and discussion in this note No results found.  Lab Results: Personally reviewed CBC    Component Value  Date/Time   WBC 8.9 11/09/2023 1113   WBC 9.2 09/27/2021 1317   RBC 4.58 11/09/2023 1113   RBC 4.45 09/27/2021 1317   HGB 13.4 11/09/2023 1113   HCT 41.9 11/09/2023 1113   PLT 446 11/09/2023 1113   MCV 92 11/09/2023 1113   MCH 29.3 11/09/2023 1113   MCH 29.7 09/27/2021 1317   MCHC 32.0 11/09/2023 1113   MCHC 33.6 09/27/2021 1317   RDW 13.8 11/09/2023 1113   LYMPHSABS 2.6 11/09/2023 1113   MONOABS 0.6 09/27/2021 1317   EOSABS 0.2 11/09/2023 1113   BASOSABS 0.0 11/09/2023 1113    BMET    Component Value Date/Time   NA 141 11/09/2023 1113   K 4.2 11/09/2023 1113   CL 102 11/09/2023 1113   CO2 21 11/09/2023 1113   GLUCOSE 83 11/09/2023 1113   GLUCOSE 86 05/19/2023 1405   BUN 28 (H) 11/09/2023 1113   CREATININE 0.95 11/09/2023 1113   CALCIUM  10.6 (H) 11/09/2023 1113   GFRNONAA >  60 05/19/2023 1405   GFRAA 72 12/10/2020 1003    BNP No results found for: "BNP"  ProBNP No results found for: "PROBNP"  Specialty Problems   None   Allergies  Allergen Reactions   Crestor  [Rosuvastatin ]     Joint pain   Macrobid [Nitrofurantoin]    Pravastatin     Joint pain    Immunization History  Administered Date(s) Administered   Influenza-Unspecified 09/18/2020, 08/30/2022, 08/31/2023   Unspecified SARS-COV-2 Vaccination 10/31/2019, 01/29/2020, 08/08/2020   Zoster Recombinant(Shingrix ) 11/09/2023, 01/12/2024    Past Medical History:  Diagnosis Date   Allergy    Anemia    Aortic atherosclerosis (HCC)    Benign secondary hypertension due to renal artery stenosis (HCC)    Constipation    uses OTC stool softener prn only- this is not chronic   Diabetes mellitus without complication (HCC)    GERD (gastroesophageal reflux disease)    Hyperlipemia    Hyperlipidemia    Hypertension    Pain and swelling of right ankle 02/17/2021   Right upper quadrant abdominal pain 02/17/2021    Tobacco History: Social History   Tobacco Use  Smoking Status Former   Passive exposure:  Past  Smokeless Tobacco Never   Counseling given: Not Answered   Continue to not smoke  Outpatient Encounter Medications as of 03/27/2024  Medication Sig   albuterol  (VENTOLIN  HFA) 108 (90 Base) MCG/ACT inhaler Inhale 2 puffs into the lungs every 6 (six) hours as needed for wheezing or shortness of breath (Cough).   benazepril  (LOTENSIN ) 20 MG tablet Take 1 tablet (20 mg total) by mouth daily.   clobetasol  ointment (TEMOVATE ) 0.05 % Apply topically 2 (two) times a week.   Continuous Glucose Sensor (FREESTYLE LIBRE 3 SENSOR) MISC Place 1 sensor on the skin every 14 days. Use to check glucose continuously   cyclobenzaprine  (FLEXERIL ) 10 MG tablet Take 1/2 - 1 tablet (5 - 10 mg total) by mouth 3 times daily as needed for muscle spasms.   DULoxetine  (CYMBALTA ) 20 MG capsule Take 2 capsules (40 mg total) by mouth daily.   esomeprazole  (NEXIUM ) 40 MG capsule Take 1 capsule (40 mg total) by mouth daily.   estradiol  (ESTRACE  VAGINAL) 0.1 MG/GM vaginal cream Place 1 Applicatorful vaginally nightly for 2 weeks, then twice weekly therafter   ezetimibe  (ZETIA ) 10 MG tablet Take 1 tablet by mouth daily.   fenofibrate  (TRICOR ) 145 MG tablet Take 1 tablet (145 mg total) by mouth daily.   fluconazole  (DIFLUCAN ) 150 MG tablet Take one tablet by mouth every three days until completed. Buick repeat if rash persists or returns   furosemide  (LASIX ) 40 MG tablet Take 1 tablet (40 mg total) by mouth daily.   ondansetron  (ZOFRAN ) 8 MG tablet Take 1 tablet (8 mg total) by mouth every 8 (eight) hours as needed for nausea or vomiting.   Tiotropium Bromide-Olodaterol (STIOLTO RESPIMAT ) 2.5-2.5 MCG/ACT AERS Inhale 2 puffs into the lungs daily.   tirzepatide  (MOUNJARO ) 15 MG/0.5ML Pen Inject 15 mg into the skin once a week.   traMADol  (ULTRAM ) 50 MG tablet Take 1 tablet (50 mg total) by mouth every 8 (eight) hours as needed for moderate pain (pain score 4-6) or severe pain (pain score 7-10).   Vitamin D , Ergocalciferol ,  (DRISDOL ) 1.25 MG (50000 UNIT) CAPS capsule Take 1 capsule (50,000 Units total) by mouth every 7 (seven) days.   No facility-administered encounter medications on file as of 03/27/2024.     Review of Systems  Review of  Systems  No chest pain with exertion.  No orthopnea or PND.  Comprehensive review of systems otherwise negative. Physical Exam  BP 124/80 (BP Location: Left Arm, Patient Position: Sitting, Cuff Size: Normal)   Pulse 70   Temp 98.2 F (36.8 C) (Oral)   Resp 18   Ht 5\' 3"  (1.6 m)   Wt 147 lb (66.7 kg)   LMP 03/20/2016   SpO2 98%   BMI 26.04 kg/m   Wt Readings from Last 5 Encounters:  03/27/24 147 lb (66.7 kg)  12/15/23 157 lb 12.8 oz (71.6 kg)  11/09/23 168 lb 3.2 oz (76.3 kg)  09/03/23 187 lb 12.8 oz (85.2 kg)  05/25/23 204 lb 12.9 oz (92.9 kg)    BMI Readings from Last 5 Encounters:  03/27/24 26.04 kg/m  12/15/23 28.86 kg/m  11/09/23 30.76 kg/m  09/03/23 33.27 kg/m  05/25/23 37.46 kg/m     Physical Exam General: Sitting in chair, no acute distress Eyes: EOMI, no icterus Neck: Supple, no JVP Pulmonary:  normal work of breathing Cardiovascular: Warm, no edema noted Abdomen: Nondistended MSK: No synovitis, no joint effusion Neuro: Normal gait, no weakness Psych: Normal mood, full affect   Assessment & Plan:   Emphysema: Very mild on CT scan with a predilection for the upper lobes.  She is quit smoking.  Do not suspect will worsen. No COPD on PFTs which is reassuring.  Encourage yearly CT scan lung cancer screening as determined by PCP.  Dyspnea on exertion: Very mild.  Possibly related to emphysema with signs of hyperinflation and air trapping on PFTs.  No significant improvement to date with LAMA LABA therapy.  She agrees to reengage, try again Stiolto 2 puffs once daily.  Encouraged use for 1 month, 30 days, an entire inhaler.  If no significant improvement inhalers Oehlert just not help despite PFTs findings or Nahm benefit from different  delivery method, consider Anoro versus bevespi.   Return if symptoms worsen or fail to improve, for f/u Dr. Marygrace Snellen.   Guerry Leek, MD 03/27/2024

## 2024-04-13 ENCOUNTER — Other Ambulatory Visit (HOSPITAL_COMMUNITY): Payer: Self-pay

## 2024-05-01 ENCOUNTER — Other Ambulatory Visit (HOSPITAL_COMMUNITY): Payer: Self-pay

## 2024-05-01 ENCOUNTER — Other Ambulatory Visit: Payer: Self-pay | Admitting: Nurse Practitioner

## 2024-05-01 DIAGNOSIS — J069 Acute upper respiratory infection, unspecified: Secondary | ICD-10-CM

## 2024-05-01 MED ORDER — BENZONATATE 200 MG PO CAPS
200.0000 mg | ORAL_CAPSULE | Freq: Three times a day (TID) | ORAL | 2 refills | Status: AC | PRN
  Filled 2024-05-01: qty 40, 14d supply, fill #0

## 2024-05-02 ENCOUNTER — Ambulatory Visit: Admitting: Nurse Practitioner

## 2024-05-15 ENCOUNTER — Other Ambulatory Visit: Payer: Self-pay

## 2024-05-15 ENCOUNTER — Other Ambulatory Visit (HOSPITAL_COMMUNITY): Payer: Self-pay

## 2024-07-03 ENCOUNTER — Other Ambulatory Visit: Payer: Self-pay | Admitting: Nurse Practitioner

## 2024-07-03 DIAGNOSIS — E1169 Type 2 diabetes mellitus with other specified complication: Secondary | ICD-10-CM

## 2024-07-04 ENCOUNTER — Other Ambulatory Visit: Payer: Self-pay

## 2024-07-04 ENCOUNTER — Other Ambulatory Visit (HOSPITAL_COMMUNITY): Payer: Self-pay

## 2024-07-04 MED ORDER — EZETIMIBE 10 MG PO TABS
10.0000 mg | ORAL_TABLET | Freq: Every day | ORAL | 1 refills | Status: AC
  Filled 2024-07-04: qty 90, 90d supply, fill #0
  Filled 2024-10-12: qty 90, 90d supply, fill #1

## 2024-07-12 ENCOUNTER — Other Ambulatory Visit: Payer: Self-pay | Admitting: Medical Genetics

## 2024-07-18 ENCOUNTER — Other Ambulatory Visit: Payer: Self-pay

## 2024-07-18 ENCOUNTER — Other Ambulatory Visit (HOSPITAL_COMMUNITY)
Admission: RE | Admit: 2024-07-18 | Discharge: 2024-07-18 | Disposition: A | Discharge status: Home / Self Care | Payer: Self-pay | Source: Ambulatory Visit | Attending: Oncology | Admitting: Oncology

## 2024-07-18 ENCOUNTER — Other Ambulatory Visit: Payer: Self-pay | Admitting: Nurse Practitioner

## 2024-07-18 ENCOUNTER — Other Ambulatory Visit (HOSPITAL_COMMUNITY): Payer: Self-pay

## 2024-07-18 DIAGNOSIS — E1159 Type 2 diabetes mellitus with other circulatory complications: Secondary | ICD-10-CM

## 2024-07-18 DIAGNOSIS — K219 Gastro-esophageal reflux disease without esophagitis: Secondary | ICD-10-CM

## 2024-07-18 MED ORDER — BENAZEPRIL HCL 20 MG PO TABS
20.0000 mg | ORAL_TABLET | Freq: Every day | ORAL | 0 refills | Status: DC
  Filled 2024-07-18: qty 90, 90d supply, fill #0

## 2024-07-18 MED ORDER — DULOXETINE HCL 20 MG PO CPEP
40.0000 mg | ORAL_CAPSULE | Freq: Every day | ORAL | 0 refills | Status: DC
  Filled 2024-07-18: qty 180, 90d supply, fill #0

## 2024-07-18 MED ORDER — ESOMEPRAZOLE MAGNESIUM 40 MG PO CPDR
40.0000 mg | DELAYED_RELEASE_CAPSULE | Freq: Every day | ORAL | 0 refills | Status: DC
  Filled 2024-07-18: qty 90, 90d supply, fill #0

## 2024-07-25 LAB — GENECONNECT MOLECULAR SCREEN: Genetic Analysis Overall Interpretation: NEGATIVE

## 2024-07-27 ENCOUNTER — Ambulatory Visit: Admitting: Nurse Practitioner

## 2024-07-29 ENCOUNTER — Other Ambulatory Visit: Payer: Self-pay | Admitting: Nurse Practitioner

## 2024-07-29 DIAGNOSIS — E1169 Type 2 diabetes mellitus with other specified complication: Secondary | ICD-10-CM

## 2024-07-29 DIAGNOSIS — I700 Atherosclerosis of aorta: Secondary | ICD-10-CM

## 2024-07-29 DIAGNOSIS — Z6841 Body Mass Index (BMI) 40.0 and over, adult: Secondary | ICD-10-CM

## 2024-07-29 DIAGNOSIS — E118 Type 2 diabetes mellitus with unspecified complications: Secondary | ICD-10-CM

## 2024-07-29 DIAGNOSIS — I152 Hypertension secondary to endocrine disorders: Secondary | ICD-10-CM

## 2024-08-01 ENCOUNTER — Other Ambulatory Visit (HOSPITAL_COMMUNITY): Payer: Self-pay

## 2024-08-01 MED ORDER — MOUNJARO 15 MG/0.5ML ~~LOC~~ SOAJ
15.0000 mg | SUBCUTANEOUS | 1 refills | Status: AC
  Filled 2024-08-01: qty 6, 84d supply, fill #0
  Filled 2024-10-12: qty 6, 84d supply, fill #1

## 2024-09-07 ENCOUNTER — Other Ambulatory Visit: Payer: Self-pay

## 2024-10-02 ENCOUNTER — Encounter: Payer: Self-pay | Admitting: Radiology

## 2024-10-12 ENCOUNTER — Other Ambulatory Visit: Payer: Self-pay | Admitting: Nurse Practitioner

## 2024-10-12 ENCOUNTER — Other Ambulatory Visit (HOSPITAL_COMMUNITY): Payer: Self-pay

## 2024-10-12 ENCOUNTER — Other Ambulatory Visit: Payer: Self-pay | Admitting: Cardiology

## 2024-10-12 ENCOUNTER — Other Ambulatory Visit: Payer: Self-pay

## 2024-10-12 DIAGNOSIS — M797 Fibromyalgia: Secondary | ICD-10-CM

## 2024-10-12 DIAGNOSIS — E1159 Type 2 diabetes mellitus with other circulatory complications: Secondary | ICD-10-CM

## 2024-10-12 DIAGNOSIS — K219 Gastro-esophageal reflux disease without esophagitis: Secondary | ICD-10-CM

## 2024-10-12 MED ORDER — BENAZEPRIL HCL 20 MG PO TABS
20.0000 mg | ORAL_TABLET | Freq: Every day | ORAL | 0 refills | Status: AC
  Filled 2024-10-12: qty 90, 90d supply, fill #0

## 2024-10-12 MED ORDER — ESOMEPRAZOLE MAGNESIUM 40 MG PO CPDR
40.0000 mg | DELAYED_RELEASE_CAPSULE | Freq: Every day | ORAL | 0 refills | Status: AC
  Filled 2024-10-12: qty 90, 90d supply, fill #0

## 2024-10-12 NOTE — Telephone Encounter (Signed)
 Last appt 12/15/23

## 2024-10-13 ENCOUNTER — Other Ambulatory Visit (HOSPITAL_COMMUNITY): Payer: Self-pay

## 2024-10-13 MED ORDER — TRAMADOL HCL 50 MG PO TABS
50.0000 mg | ORAL_TABLET | Freq: Three times a day (TID) | ORAL | 2 refills | Status: AC | PRN
  Filled 2024-10-13: qty 60, 20d supply, fill #0

## 2024-10-13 MED ORDER — DULOXETINE HCL 20 MG PO CPEP
40.0000 mg | ORAL_CAPSULE | Freq: Every day | ORAL | 3 refills | Status: AC
  Filled 2024-10-13: qty 180, 90d supply, fill #0

## 2024-10-16 ENCOUNTER — Other Ambulatory Visit: Payer: Self-pay

## 2024-10-16 ENCOUNTER — Other Ambulatory Visit (HOSPITAL_COMMUNITY): Payer: Self-pay

## 2024-10-16 MED ORDER — FENOFIBRATE 145 MG PO TABS
145.0000 mg | ORAL_TABLET | Freq: Every day | ORAL | 0 refills | Status: DC
  Filled 2024-10-16: qty 30, 30d supply, fill #0

## 2024-11-01 NOTE — Progress Notes (Deleted)
 Flu: Covid: T-dap: Pneumonia: Colonoscopy: Eye exam:

## 2024-11-04 NOTE — Progress Notes (Unsigned)
 Cardiology Clinic Note   Date: 11/06/2024 ID: Michelle Ray, DOB August 07, 1966, MRN [id number]  Primary Cardiologist:  Redell Cave, MD  Chief Complaint   Michelle Ray is a 58 y.o. female who presents to the clinic today for routine follow up.   Patient Profile   Michelle Ray is followed by Dr. Cave for the history outlined below.      Past medical history significant for: Nonobstructive CAD. Echo 12/12/2020: EF 60 to 65%.  No RWMA.  Grade I DD.  Normal RV size/function.  No significant valvular abnormalities. Coronary CTA 12/26/2020: Coronary calcium  score 25.7.  Minimal calcification mid LAD. Hypertension. Hyperlipidemia. Lipid panel 11/09/2023: LDL 76, HDL 57, TG 110, total 153. Varicose veins. 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 GERD. T2DM.  In summary, patient was first evaluated by Dr. Cave on 12/10/2020 for chest pain.  Patient reported a 89-month history of chest pain typically associated with exertion described as central tightness and associated with shortness of breath.  Family history includes father with MI at age 31.  Echo demonstrated EF 60 to 65%.  Coronary CTA demonstrated minimal nonobstructive CAD.  Upon follow-up in April 2022 she reported no further episodes of chest pain.  Patient was last seen in the office by Dr. Cave on 09/03/2023 for routine follow-up.  She denied cardiac complaints.  She reported statin intolerance.  Insurance did not approve bempedoic acid .  She was continued on Zetia  and started on fenofibrate .     History of Present Illness    Today, patient is doing well. Patient denies shortness of breath, dyspnea on exertion, lower extremity edema, orthopnea or PND. No chest pain, pressure, or tightness. No palpitations. She is very active walking and hiking. She has lost 100 lb and kept it off for 1 year. She has not needed Lasix  for quite some time.     ROS: All other systems reviewed and are otherwise negative except as noted in History of  Present Illness.  EKGs/Labs Reviewed    EKG Interpretation Date/Time:  Monday November 06 2024 08:35:08 EST Ventricular Rate:  71 PR Interval:  162 QRS Duration:  90 QT Interval:  380 QTC Calculation: 412 R Axis:   25  Text Interpretation: Normal sinus rhythm Normal ECG When compared with ECG of 03-Sep-2023 11:15, No significant change was found Confirmed by Loistine Sober [phone]) on 11/06/2024 8:39:44 AM   11/09/2023: ALT 25; AST 21; BUN 28; Creatinine, Ser 0.95; Potassium 4.2; Sodium 141   11/09/2023: Hemoglobin 13.4; WBC 8.9    Physical Exam    VS:  BP 132/82   Pulse 71   Ht 5' 3 (1.6 m)   Wt 153 lb 12.8 oz (69.8 kg)   LMP 03/20/2016   SpO2 97%   BMI 27.24 kg/m  , BMI Body mass index is 27.24 kg/m.  GEN: Well nourished, well developed, in no acute distress. Neck: No JVD or carotid bruits. Cardiac:  RRR.  No murmur. No rubs or gallops.   Respiratory:  Respirations regular and unlabored. Clear to auscultation without rales, wheezing or rhonchi. GI: Soft, nontender, nondistended. Extremities: Radials/DP/PT 2+ and equal bilaterally. No clubbing or cyanosis. No edema   Skin: Warm and dry, no rash. Neuro: Strength intact.  Assessment & Plan   Nonobstructive CAD Coronary CTA January 2022 demonstrated calcium  score of 25.7 with minimal calcification mid LAD.  Echo January 2022 demonstrated normal LV/RV function, Grade I DD, no significant valvular abnormalities.  Patient denies chest pain, pressure or tightness. She is very active walking  and hiking without limitation.  - Continue Zetia  and fenofibrate .  Hypertension BP today 132/82. No headaches or dizziness reported.  - Continue benazepril .  Hyperlipidemia/statin myopathy LDL 76, TG 110 December 2024.  Patient is intolerant to statins secondary to myalgia.  - Continue Zetia  and fenofibrate . - Lipid panel will be repeated by PCP at the end of this week.   Varicose veins S/p right GSV March 2023.  Patient  denies claudication or lower extremity edema. She has not needed Lasix  in quite some time.  - Continue to monitor.   Disposition: Return in 1 year or sooner as needed.          Signed, Barnie HERO. Deyon Chizek, DNP, NP-C

## 2024-11-06 ENCOUNTER — Encounter: Payer: Self-pay | Admitting: Student

## 2024-11-06 ENCOUNTER — Ambulatory Visit: Attending: Student | Admitting: Student

## 2024-11-06 VITALS — BP 132/82 | HR 71 | Ht 63.0 in | Wt 153.8 lb

## 2024-11-06 DIAGNOSIS — G720 Drug-induced myopathy: Secondary | ICD-10-CM | POA: Diagnosis not present

## 2024-11-06 DIAGNOSIS — I10 Essential (primary) hypertension: Secondary | ICD-10-CM | POA: Diagnosis not present

## 2024-11-06 DIAGNOSIS — T466X5D Adverse effect of antihyperlipidemic and antiarteriosclerotic drugs, subsequent encounter: Secondary | ICD-10-CM | POA: Diagnosis not present

## 2024-11-06 DIAGNOSIS — I8390 Asymptomatic varicose veins of unspecified lower extremity: Secondary | ICD-10-CM | POA: Diagnosis not present

## 2024-11-06 DIAGNOSIS — E782 Mixed hyperlipidemia: Secondary | ICD-10-CM

## 2024-11-06 DIAGNOSIS — I2510 Atherosclerotic heart disease of native coronary artery without angina pectoris: Secondary | ICD-10-CM | POA: Diagnosis not present

## 2024-11-06 NOTE — Patient Instructions (Signed)
 Medication Instructions:   Your physician recommends that you continue on your current medications as directed. Please refer to the Current Medication list given to you today.    *If you need a refill on your cardiac medications before your next appointment, please call your pharmacy*  Lab Work:  None ordered at this time   If you have labs (blood work) drawn today and your tests are completely normal, you will receive your results only by:  MyChart Message (if you have MyChart) OR  A paper copy in the mail If you have any lab test that is abnormal or we need to change your treatment, we will call you to review the results.  Testing/Procedures:  None ordered at this time   Referrals:  None ordered at this time   Follow-Up:  At [facility], you and your health needs are our priority.  As part of our continuing mission to provide you with exceptional heart care, our providers are all part of one team.  This team includes your primary Cardiologist (physician) and Advanced Practice Providers or APPs (Physician Assistants and Nurse Practitioners) who all work together to provide you with the care you need, when you need it.  Your next appointment:   1 year(s)  Provider:    Redell Cave, MD or Barnie Hila, NP    We recommend signing up for the patient portal called MyChart.  Sign up information is provided on this After Visit Summary.  MyChart is used to connect with patients for Virtual Visits (Telemedicine).  Patients are able to view lab/test results, encounter notes, upcoming appointments, etc.  Non-urgent messages can be sent to your provider as well.   To learn more about what you can do with MyChart, go to [email or web address].

## 2024-11-10 ENCOUNTER — Encounter: Payer: Commercial Managed Care - PPO | Admitting: Nurse Practitioner

## 2024-11-20 ENCOUNTER — Encounter: Payer: Self-pay | Admitting: Cardiology

## 2024-11-20 ENCOUNTER — Other Ambulatory Visit: Payer: Self-pay | Admitting: Cardiology

## 2024-11-20 ENCOUNTER — Other Ambulatory Visit (HOSPITAL_COMMUNITY): Payer: Self-pay

## 2024-11-20 MED ORDER — FENOFIBRATE 145 MG PO TABS
145.0000 mg | ORAL_TABLET | Freq: Every day | ORAL | 3 refills | Status: AC
  Filled 2024-11-20: qty 90, 90d supply, fill #0

## 2024-12-13 ENCOUNTER — Other Ambulatory Visit (HOSPITAL_COMMUNITY): Payer: Self-pay

## 2024-12-13 MED ORDER — BUPROPION HCL ER (XL) 150 MG PO TB24
150.0000 mg | ORAL_TABLET | Freq: Every day | ORAL | 0 refills | Status: DC
  Filled 2024-12-13: qty 30, 30d supply, fill #0

## 2024-12-14 ENCOUNTER — Other Ambulatory Visit (HOSPITAL_COMMUNITY): Payer: Self-pay

## 2024-12-28 ENCOUNTER — Other Ambulatory Visit (HOSPITAL_COMMUNITY): Payer: Self-pay

## 2024-12-28 MED ORDER — BUPROPION HCL ER (XL) 150 MG PO TB24
150.0000 mg | ORAL_TABLET | Freq: Every day | ORAL | 0 refills | Status: AC
  Filled 2024-12-28: qty 90, 90d supply, fill #0

## 2025-01-03 NOTE — Progress Notes (Unsigned)
 Mammogram {SEHMDOC:34218}, Colon Cancer Screening {SEHMDOC:34218}, Retinal / Diabetic Eye Exam {SEHMDOC:34218}, Pneumonia Vaccine {SEHMDOC:34218}, and Tetanus (TDAP) Vaccine {SEHMDOC:34218}  Catheline Doing, DNP, AGNP-c [facility]  [address] [place], [place] [address] [phone]   ESTABLISHED PATIENT- Chronic Health and/or Follow-Up Visit on 01/08/2025  Last menstrual period 03/20/2016.   Subjective:  No chief complaint on file.   *** ROS negative except for what is listed in HPI. History, Medications, Surgery, SDOH, and Family History reviewed and updated as appropriate.  Objective:  Physical Exam      Assessment & Plan:   Assessment & Plan    Camie FORBES Doing, DNP, AGNP-c  {SETIMEYorN (Optional):34216}

## 2025-01-08 ENCOUNTER — Ambulatory Visit: Admitting: Nurse Practitioner
